# Patient Record
Sex: Female | Born: 1935
Health system: Southern US, Community
[De-identification: ages and names within clinical notes are randomized; demographics above are authoritative.]

## PROBLEM LIST (undated history)

## (undated) DIAGNOSIS — F32A Depression, unspecified: Secondary | ICD-10-CM

## (undated) DIAGNOSIS — I2510 Atherosclerotic heart disease of native coronary artery without angina pectoris: Secondary | ICD-10-CM

## (undated) DIAGNOSIS — K449 Diaphragmatic hernia without obstruction or gangrene: Secondary | ICD-10-CM

## (undated) DIAGNOSIS — H269 Unspecified cataract: Secondary | ICD-10-CM

## (undated) DIAGNOSIS — T7840XA Allergy, unspecified, initial encounter: Secondary | ICD-10-CM

## (undated) DIAGNOSIS — M1990 Unspecified osteoarthritis, unspecified site: Secondary | ICD-10-CM

## (undated) DIAGNOSIS — N6019 Diffuse cystic mastopathy of unspecified breast: Secondary | ICD-10-CM

## (undated) DIAGNOSIS — M810 Age-related osteoporosis without current pathological fracture: Secondary | ICD-10-CM

## (undated) DIAGNOSIS — F329 Major depressive disorder, single episode, unspecified: Secondary | ICD-10-CM

## (undated) DIAGNOSIS — K219 Gastro-esophageal reflux disease without esophagitis: Secondary | ICD-10-CM

## (undated) DIAGNOSIS — J45909 Unspecified asthma, uncomplicated: Secondary | ICD-10-CM

## (undated) DIAGNOSIS — F419 Anxiety disorder, unspecified: Secondary | ICD-10-CM

## (undated) DIAGNOSIS — R011 Cardiac murmur, unspecified: Secondary | ICD-10-CM

## (undated) DIAGNOSIS — K5900 Constipation, unspecified: Secondary | ICD-10-CM

## (undated) HISTORY — DX: Age-related osteoporosis without current pathological fracture: M81.0

## (undated) HISTORY — PX: EYE SURGERY: SHX253

## (undated) HISTORY — DX: Allergy, unspecified, initial encounter: T78.40XA

## (undated) HISTORY — PX: HIP SURGERY: SHX245

## (undated) HISTORY — DX: Gastro-esophageal reflux disease without esophagitis: K21.9

## (undated) HISTORY — DX: Cardiac murmur, unspecified: R01.1

## (undated) HISTORY — PX: ABDOMINAL HYSTERECTOMY: SHX81

## (undated) HISTORY — DX: Anxiety disorder, unspecified: F41.9

## (undated) HISTORY — DX: Unspecified cataract: H26.9

## (undated) HISTORY — DX: Constipation, unspecified: K59.00

## (undated) HISTORY — PX: CHOLECYSTECTOMY: SHX55

---

## 2000-10-25 ENCOUNTER — Emergency Department (HOSPITAL_COMMUNITY): Admission: EM | Admit: 2000-10-25 | Discharge: 2000-10-25 | Payer: Self-pay | Admitting: Emergency Medicine

## 2000-10-29 ENCOUNTER — Encounter: Payer: Self-pay | Admitting: Family Medicine

## 2000-10-29 ENCOUNTER — Ambulatory Visit (HOSPITAL_COMMUNITY): Admission: RE | Admit: 2000-10-29 | Discharge: 2000-10-29 | Payer: Self-pay | Admitting: Family Medicine

## 2001-04-01 ENCOUNTER — Ambulatory Visit (HOSPITAL_COMMUNITY): Admission: RE | Admit: 2001-04-01 | Discharge: 2001-04-01 | Payer: Self-pay | Admitting: Family Medicine

## 2001-04-01 ENCOUNTER — Encounter: Payer: Self-pay | Admitting: Family Medicine

## 2002-01-19 ENCOUNTER — Encounter: Payer: Self-pay | Admitting: Family Medicine

## 2002-01-19 ENCOUNTER — Ambulatory Visit (HOSPITAL_COMMUNITY): Admission: RE | Admit: 2002-01-19 | Discharge: 2002-01-19 | Payer: Self-pay | Admitting: Family Medicine

## 2003-05-01 ENCOUNTER — Encounter: Payer: Self-pay | Admitting: Family Medicine

## 2003-05-01 ENCOUNTER — Ambulatory Visit (HOSPITAL_COMMUNITY): Admission: RE | Admit: 2003-05-01 | Discharge: 2003-05-01 | Payer: Self-pay | Admitting: Family Medicine

## 2003-09-18 ENCOUNTER — Ambulatory Visit (HOSPITAL_COMMUNITY): Admission: RE | Admit: 2003-09-18 | Discharge: 2003-09-18 | Payer: Self-pay | Admitting: Family Medicine

## 2004-10-09 ENCOUNTER — Inpatient Hospital Stay (HOSPITAL_COMMUNITY): Admission: EM | Admit: 2004-10-09 | Discharge: 2004-10-10 | Payer: Self-pay | Admitting: *Deleted

## 2004-10-11 ENCOUNTER — Ambulatory Visit: Payer: Self-pay | Admitting: Psychology

## 2004-11-14 ENCOUNTER — Ambulatory Visit: Payer: Self-pay | Admitting: Psychiatry

## 2005-11-04 ENCOUNTER — Ambulatory Visit (HOSPITAL_COMMUNITY): Admission: RE | Admit: 2005-11-04 | Discharge: 2005-11-04 | Payer: Self-pay | Admitting: Family Medicine

## 2006-12-28 ENCOUNTER — Ambulatory Visit (HOSPITAL_COMMUNITY): Admission: RE | Admit: 2006-12-28 | Discharge: 2006-12-28 | Payer: Self-pay | Admitting: Ophthalmology

## 2007-01-18 ENCOUNTER — Ambulatory Visit (HOSPITAL_COMMUNITY): Admission: RE | Admit: 2007-01-18 | Discharge: 2007-01-18 | Payer: Self-pay | Admitting: Ophthalmology

## 2007-10-05 ENCOUNTER — Ambulatory Visit (HOSPITAL_COMMUNITY): Admission: RE | Admit: 2007-10-05 | Discharge: 2007-10-05 | Payer: Self-pay | Admitting: Family Medicine

## 2007-12-20 ENCOUNTER — Ambulatory Visit (HOSPITAL_COMMUNITY): Admission: RE | Admit: 2007-12-20 | Discharge: 2007-12-20 | Payer: Self-pay | Admitting: Family Medicine

## 2008-02-14 ENCOUNTER — Ambulatory Visit (HOSPITAL_COMMUNITY): Admission: RE | Admit: 2008-02-14 | Discharge: 2008-02-14 | Payer: Self-pay | Admitting: Ophthalmology

## 2008-03-13 ENCOUNTER — Ambulatory Visit (HOSPITAL_COMMUNITY): Admission: RE | Admit: 2008-03-13 | Discharge: 2008-03-13 | Payer: Self-pay | Admitting: Ophthalmology

## 2008-12-19 ENCOUNTER — Ambulatory Visit (HOSPITAL_COMMUNITY): Admission: RE | Admit: 2008-12-19 | Discharge: 2008-12-19 | Payer: Self-pay | Admitting: Family Medicine

## 2009-04-05 ENCOUNTER — Ambulatory Visit (HOSPITAL_COMMUNITY): Admission: RE | Admit: 2009-04-05 | Discharge: 2009-04-05 | Payer: Self-pay | Admitting: Family Medicine

## 2009-04-18 ENCOUNTER — Emergency Department (HOSPITAL_COMMUNITY): Admission: EM | Admit: 2009-04-18 | Discharge: 2009-04-18 | Payer: Self-pay | Admitting: Emergency Medicine

## 2009-05-08 ENCOUNTER — Ambulatory Visit (HOSPITAL_COMMUNITY): Admission: RE | Admit: 2009-05-08 | Discharge: 2009-05-08 | Payer: Self-pay | Admitting: Pulmonary Disease

## 2010-02-20 ENCOUNTER — Ambulatory Visit (HOSPITAL_COMMUNITY): Admission: RE | Admit: 2010-02-20 | Discharge: 2010-02-20 | Payer: Self-pay | Admitting: Family Medicine

## 2010-08-04 ENCOUNTER — Encounter: Payer: Self-pay | Admitting: Family Medicine

## 2010-10-17 LAB — BRAIN NATRIURETIC PEPTIDE: Pro B Natriuretic peptide (BNP): <30 pg/mL (ref 0.0–100.0)

## 2010-10-17 LAB — CBC
HCT: 40.3 % (ref 36.0–46.0)
Hemoglobin: 14.1 g/dL (ref 12.0–15.0)
MCV: 92.6 fL (ref 78.0–100.0)
RBC: 4.36 MIL/uL (ref 3.87–5.11)
WBC: 8.2 10*3/uL (ref 4.0–10.5)

## 2010-10-17 LAB — BASIC METABOLIC PANEL
CO2: 27 mEq/L (ref 19–32)
Chloride: 103 mEq/L (ref 96–112)
Creatinine, Ser: 0.79 mg/dL (ref 0.4–1.2)
GFR calc Af Amer: >60 mL/min (ref >60)
Glucose, Bld: 158 mg/dL — ABNORMAL HIGH (ref 70–99)
Potassium: 3.9 mEq/L (ref 3.5–5.1)
Sodium: 138 mEq/L (ref 135–145)

## 2010-10-17 LAB — DIFFERENTIAL
Basophils Absolute: 0 10*3/uL (ref 0.0–0.1)
Eosinophils Relative: 7 % — ABNORMAL HIGH (ref 0–5)
Monocytes Absolute: 0.4 10*3/uL (ref 0.1–1.0)
Neutro Abs: 6 10*3/uL (ref 1.7–7.7)
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
## 2010-11-29 NOTE — Group Therapy Note (Signed)
Mikayla Wilson, Mikayla Wilson              ACCOUNT NO.:  [id number]   MEDICAL RECORD NO.:  [id number]          PATIENT TYPE:  INP   LOCATION:  A313                          FACILITY:  [facility]   PHYSICIAN:  Angus G. Renard Matter, MD   DATE OF BIRTH:  08-11-1930   DATE OF PROCEDURE:  10/10/2004  DATE OF DISCHARGE:                                   PROGRESS NOTE   This patient was seen by a psychologist yesterday and a family conference is  being planned today. She remains relatively calm.   PHYSICAL EXAMINATION:  Her blood pressure 127/73, respirations 20, pulse 90,  temperature 98.1. Heart:  Regular rhythm. Lungs: Clear to percussion and  auscultation. Abdomen: No palpable organs or masses.   ASSESSMENT:  The patient was admitted after an episode of paranoid behavior.  Her condition is stable.   PLAN:  Continue current regimen.      AGM/MEDQ  D:  10/10/2004  T:  10/10/2004  Job:  [id number]

## 2010-11-29 NOTE — Group Therapy Note (Signed)
NAMEWILLYE, Mikayla Wilson              ACCOUNT NO.:  [id number]   MEDICAL RECORD NO.:  [id number]          PATIENT TYPE:  OBV   LOCATION:  A313                          FACILITY:  [facility]   PHYSICIAN:  Angus G. 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   OBJECTIVE:  VITAL SIGNS:  Blood pressure 129/86, respirations 22, pulse 88,  temperature 98.  She does have low potassium 3.3.  LUNGS:  Clear, but diminished breath sounds.  HEART:  Regular rhythm.  ABDOMEN:  No palpable organs or masses.   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   PLAN:  Continue current regimen.      AGM/MEDQ  D:  10/08/2004  T:  10/08/2004  Job:  [id number]
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
## 2011-05-01 LAB — BASIC METABOLIC PANEL
BUN: 7
Calcium: 9.2
Chloride: 102
Creatinine, Ser: 0.77
GFR calc Af Amer: >60

## 2011-05-01 LAB — HEMOGLOBIN AND HEMATOCRIT, BLOOD
HCT: 37.5
Hemoglobin: 13.3

## 2013-02-25 ENCOUNTER — Other Ambulatory Visit (HOSPITAL_COMMUNITY): Payer: Self-pay | Admitting: Family Medicine

## 2013-02-25 DIAGNOSIS — Z139 Encounter for screening, unspecified: Secondary | ICD-10-CM

## 2013-05-05 ENCOUNTER — Ambulatory Visit (HOSPITAL_COMMUNITY)
Admission: RE | Admit: 2013-05-05 | Discharge: 2013-05-05 | Disposition: A | Discharge status: Home / Self Care | Payer: Medicare Other | Source: Ambulatory Visit | Attending: Family Medicine | Admitting: Family Medicine

## 2013-05-05 DIAGNOSIS — Z1231 Encounter for screening mammogram for malignant neoplasm of breast: Secondary | ICD-10-CM | POA: Insufficient documentation

## 2013-05-05 DIAGNOSIS — Z139 Encounter for screening, unspecified: Secondary | ICD-10-CM

## 2013-10-18 DIAGNOSIS — F410 Panic disorder [episodic paroxysmal anxiety] without agoraphobia: Secondary | ICD-10-CM | POA: Diagnosis not present

## 2013-10-18 DIAGNOSIS — Z79899 Other long term (current) drug therapy: Secondary | ICD-10-CM | POA: Diagnosis not present

## 2013-10-18 DIAGNOSIS — G4700 Insomnia, unspecified: Secondary | ICD-10-CM | POA: Diagnosis not present

## 2013-10-18 DIAGNOSIS — E785 Hyperlipidemia, unspecified: Secondary | ICD-10-CM | POA: Diagnosis not present

## 2013-10-27 DIAGNOSIS — H47329 Drusen of optic disc, unspecified eye: Secondary | ICD-10-CM | POA: Diagnosis not present

## 2013-11-08 DIAGNOSIS — J449 Chronic obstructive pulmonary disease, unspecified: Secondary | ICD-10-CM | POA: Diagnosis not present

## 2013-11-08 DIAGNOSIS — J3089 Other allergic rhinitis: Secondary | ICD-10-CM | POA: Diagnosis not present

## 2014-02-17 DIAGNOSIS — Z79899 Other long term (current) drug therapy: Secondary | ICD-10-CM | POA: Diagnosis not present

## 2014-02-17 DIAGNOSIS — E785 Hyperlipidemia, unspecified: Secondary | ICD-10-CM | POA: Diagnosis not present

## 2014-03-24 DIAGNOSIS — Z23 Encounter for immunization: Secondary | ICD-10-CM | POA: Diagnosis not present

## 2014-04-26 DIAGNOSIS — Z961 Presence of intraocular lens: Secondary | ICD-10-CM | POA: Diagnosis not present

## 2014-04-26 DIAGNOSIS — H47323 Drusen of optic disc, bilateral: Secondary | ICD-10-CM | POA: Diagnosis not present

## 2014-07-17 DIAGNOSIS — Z79899 Other long term (current) drug therapy: Secondary | ICD-10-CM | POA: Diagnosis not present

## 2014-07-17 DIAGNOSIS — E785 Hyperlipidemia, unspecified: Secondary | ICD-10-CM | POA: Diagnosis not present

## 2014-08-24 ENCOUNTER — Other Ambulatory Visit: Payer: Self-pay | Admitting: Obstetrics and Gynecology

## 2014-08-24 ENCOUNTER — Other Ambulatory Visit (HOSPITAL_COMMUNITY): Payer: Self-pay | Admitting: Family Medicine

## 2014-08-24 DIAGNOSIS — Z1231 Encounter for screening mammogram for malignant neoplasm of breast: Secondary | ICD-10-CM

## 2014-11-09 DIAGNOSIS — J4530 Mild persistent asthma, uncomplicated: Secondary | ICD-10-CM | POA: Diagnosis not present

## 2014-11-09 DIAGNOSIS — K219 Gastro-esophageal reflux disease without esophagitis: Secondary | ICD-10-CM | POA: Diagnosis not present

## 2014-11-09 DIAGNOSIS — J3089 Other allergic rhinitis: Secondary | ICD-10-CM | POA: Diagnosis not present

## 2014-11-16 ENCOUNTER — Ambulatory Visit (HOSPITAL_COMMUNITY)
Admission: RE | Admit: 2014-11-16 | Discharge: 2014-11-16 | Disposition: A | Discharge status: Home / Self Care | Payer: Medicare Other | Source: Ambulatory Visit | Attending: Family Medicine | Admitting: Family Medicine

## 2014-11-16 DIAGNOSIS — R531 Weakness: Secondary | ICD-10-CM | POA: Diagnosis not present

## 2014-11-16 DIAGNOSIS — Z1231 Encounter for screening mammogram for malignant neoplasm of breast: Secondary | ICD-10-CM | POA: Insufficient documentation

## 2014-11-16 DIAGNOSIS — E785 Hyperlipidemia, unspecified: Secondary | ICD-10-CM | POA: Diagnosis not present

## 2014-11-17 DIAGNOSIS — R531 Weakness: Secondary | ICD-10-CM | POA: Diagnosis not present

## 2014-11-17 DIAGNOSIS — Z79899 Other long term (current) drug therapy: Secondary | ICD-10-CM | POA: Diagnosis not present

## 2014-11-17 DIAGNOSIS — E785 Hyperlipidemia, unspecified: Secondary | ICD-10-CM | POA: Diagnosis not present

## 2014-11-20 ENCOUNTER — Other Ambulatory Visit: Payer: Self-pay | Admitting: Family Medicine

## 2014-11-20 DIAGNOSIS — R928 Other abnormal and inconclusive findings on diagnostic imaging of breast: Secondary | ICD-10-CM

## 2014-11-21 ENCOUNTER — Other Ambulatory Visit (HOSPITAL_COMMUNITY): Payer: Self-pay | Admitting: Family Medicine

## 2014-11-21 DIAGNOSIS — R928 Other abnormal and inconclusive findings on diagnostic imaging of breast: Secondary | ICD-10-CM

## 2014-12-12 ENCOUNTER — Ambulatory Visit (HOSPITAL_COMMUNITY)
Admission: RE | Admit: 2014-12-12 | Discharge: 2014-12-12 | Disposition: A | Discharge status: Home / Self Care | Payer: Medicare Other | Source: Ambulatory Visit | Attending: Family Medicine | Admitting: Family Medicine

## 2014-12-12 DIAGNOSIS — R928 Other abnormal and inconclusive findings on diagnostic imaging of breast: Secondary | ICD-10-CM | POA: Diagnosis not present

## 2015-01-25 ENCOUNTER — Emergency Department (HOSPITAL_COMMUNITY)
Admission: EM | Admit: 2015-01-25 | Discharge: 2015-01-26 | Disposition: A | Discharge status: Home / Self Care | Payer: Medicare Other | Attending: Emergency Medicine | Admitting: Emergency Medicine

## 2015-01-25 ENCOUNTER — Encounter (HOSPITAL_COMMUNITY): Payer: Self-pay | Admitting: *Deleted

## 2015-01-25 DIAGNOSIS — F329 Major depressive disorder, single episode, unspecified: Secondary | ICD-10-CM | POA: Insufficient documentation

## 2015-01-25 DIAGNOSIS — Z8719 Personal history of other diseases of the digestive system: Secondary | ICD-10-CM | POA: Insufficient documentation

## 2015-01-25 DIAGNOSIS — J45909 Unspecified asthma, uncomplicated: Secondary | ICD-10-CM | POA: Insufficient documentation

## 2015-01-25 DIAGNOSIS — F1310 Sedative, hypnotic or anxiolytic abuse, uncomplicated: Secondary | ICD-10-CM | POA: Diagnosis not present

## 2015-01-25 DIAGNOSIS — G479 Sleep disorder, unspecified: Secondary | ICD-10-CM | POA: Insufficient documentation

## 2015-01-25 DIAGNOSIS — Z8742 Personal history of other diseases of the female genital tract: Secondary | ICD-10-CM | POA: Insufficient documentation

## 2015-01-25 DIAGNOSIS — Z87891 Personal history of nicotine dependence: Secondary | ICD-10-CM | POA: Diagnosis not present

## 2015-01-25 DIAGNOSIS — I2510 Atherosclerotic heart disease of native coronary artery without angina pectoris: Secondary | ICD-10-CM | POA: Insufficient documentation

## 2015-01-25 DIAGNOSIS — F32A Depression, unspecified: Secondary | ICD-10-CM

## 2015-01-25 DIAGNOSIS — Z8739 Personal history of other diseases of the musculoskeletal system and connective tissue: Secondary | ICD-10-CM | POA: Insufficient documentation

## 2015-01-25 DIAGNOSIS — F419 Anxiety disorder, unspecified: Secondary | ICD-10-CM | POA: Diagnosis present

## 2015-01-25 DIAGNOSIS — R45851 Suicidal ideations: Secondary | ICD-10-CM

## 2015-01-25 DIAGNOSIS — Z046 Encounter for general psychiatric examination, requested by authority: Secondary | ICD-10-CM | POA: Diagnosis present

## 2015-01-25 HISTORY — DX: Unspecified osteoarthritis, unspecified site: M19.90

## 2015-01-25 HISTORY — DX: Diaphragmatic hernia without obstruction or gangrene: K44.9

## 2015-01-25 HISTORY — DX: Unspecified asthma, uncomplicated: J45.909

## 2015-01-25 HISTORY — DX: Depression, unspecified: F32.A

## 2015-01-25 HISTORY — DX: Diffuse cystic mastopathy of unspecified breast: N60.19

## 2015-01-25 HISTORY — DX: Atherosclerotic heart disease of native coronary artery without angina pectoris: I25.10

## 2015-01-25 HISTORY — DX: Major depressive disorder, single episode, unspecified: F32.9

## 2015-01-25 LAB — CBC WITH DIFFERENTIAL/PLATELET
Basophils Absolute: 0 10*3/uL (ref 0.0–0.1)
Basophils Relative: 0 % (ref 0–1)
EOS ABS: 0.5 10*3/uL (ref 0.0–0.7)
EOS PCT: 5 % (ref 0–5)
HEMATOCRIT: 40.9 % (ref 36.0–46.0)
Hemoglobin: 13.9 g/dL (ref 12.0–15.0)
LYMPHS ABS: 3.3 10*3/uL (ref 0.7–4.0)
LYMPHS PCT: 33 % (ref 12–46)
MCH: 31.8 pg (ref 26.0–34.0)
MCHC: 34 g/dL (ref 30.0–36.0)
MCV: 93.6 fL (ref 78.0–100.0)
Monocytes Absolute: 0.6 10*3/uL (ref 0.1–1.0)
Monocytes Relative: 6 % (ref 3–12)
NEUTROS ABS: 5.7 10*3/uL (ref 1.7–7.7)
Neutrophils Relative %: 56 % (ref 43–77)
Platelets: 181 10*3/uL (ref 150–400)
RBC: 4.37 MIL/uL (ref 3.87–5.11)
RDW: 12.3 % (ref 11.5–15.5)
WBC: 10.2 10*3/uL (ref 4.0–10.5)

## 2015-01-25 LAB — URINE MICROSCOPIC-ADD ON

## 2015-01-25 LAB — URINALYSIS, ROUTINE W REFLEX MICROSCOPIC
Bilirubin Urine: NEGATIVE
GLUCOSE, UA: NEGATIVE mg/dL
Ketones, ur: NEGATIVE mg/dL
Nitrite: NEGATIVE
PROTEIN: NEGATIVE mg/dL
SPECIFIC GRAVITY, URINE: 1.015 (ref 1.005–1.030)
Urobilinogen, UA: 0.2 mg/dL (ref 0.0–1.0)
pH: 6 (ref 5.0–8.0)

## 2015-01-25 LAB — COMPREHENSIVE METABOLIC PANEL
ALBUMIN: 4.6 g/dL (ref 3.5–5.0)
ALK PHOS: 59 U/L (ref 38–126)
ALT: 27 U/L (ref 14–54)
AST: 35 U/L (ref 15–41)
Anion gap: 9 (ref 5–15)
BILIRUBIN TOTAL: 0.7 mg/dL (ref 0.3–1.2)
BUN: 6 mg/dL (ref 6–20)
CHLORIDE: 105 mmol/L (ref 101–111)
CO2: 28 mmol/L (ref 22–32)
CREATININE: 0.83 mg/dL (ref 0.44–1.00)
Calcium: 9 mg/dL (ref 8.9–10.3)
Glucose, Bld: 110 mg/dL — ABNORMAL HIGH (ref 65–99)
Potassium: 3.3 mmol/L — ABNORMAL LOW (ref 3.5–5.1)
Sodium: 142 mmol/L (ref 135–145)
Total Protein: 7.7 g/dL (ref 6.5–8.1)

## 2015-01-25 LAB — RAPID URINE DRUG SCREEN, HOSP PERFORMED
Amphetamines: NOT DETECTED
BENZODIAZEPINES: POSITIVE — AB
Barbiturates: NOT DETECTED
Cocaine: NOT DETECTED
OPIATES: NOT DETECTED
Tetrahydrocannabinol: NOT DETECTED

## 2015-01-25 LAB — ETHANOL: Alcohol, Ethyl (B): <5 mg/dL (ref <5)
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
## 2015-01-26 ENCOUNTER — Encounter (HOSPITAL_COMMUNITY): Payer: Self-pay | Admitting: *Deleted

## 2015-01-26 DIAGNOSIS — F419 Anxiety disorder, unspecified: Secondary | ICD-10-CM | POA: Diagnosis present

## 2015-01-26 DIAGNOSIS — R45851 Suicidal ideations: Secondary | ICD-10-CM

## 2015-01-26 MED ORDER — ONDANSETRON HCL 4 MG PO TABS: 4.0000 mg | ORAL_TABLET | Freq: Three times a day (TID) | ORAL | Status: DC | PRN

## 2015-01-26 MED ORDER — IBUPROFEN 400 MG PO TABS: 600.0000 mg | ORAL_TABLET | Freq: Three times a day (TID) | ORAL | Status: DC | PRN

## 2015-01-26 MED ORDER — ZOLPIDEM TARTRATE 5 MG PO TABS: 5.0000 mg | ORAL_TABLET | Freq: Every evening | ORAL | Status: DC | PRN

## 2015-01-26 MED ORDER — NICOTINE 7 MG/24HR TD PT24
7.0000 mg | MEDICATED_PATCH | Freq: Every day | TRANSDERMAL | Status: DC
  Filled 2015-01-26 (×2): qty 1

## 2015-01-26 MED ORDER — POTASSIUM CHLORIDE CRYS ER 20 MEQ PO TBCR
40.0000 meq | EXTENDED_RELEASE_TABLET | Freq: Once | ORAL | Status: AC
  Administered 2015-01-26: 40 meq via ORAL
  Filled 2015-01-26: qty 2

## 2015-01-26 MED ORDER — ROSUVASTATIN CALCIUM 10 MG PO TABS
5.0000 mg | ORAL_TABLET | Freq: Every day | ORAL | Status: DC
  Filled 2015-01-26: qty 1

## 2015-01-26 MED ORDER — ACETAMINOPHEN 325 MG PO TABS: 650.0000 mg | ORAL_TABLET | ORAL | Status: DC | PRN

## 2015-01-26 MED ORDER — PANTOPRAZOLE SODIUM 40 MG PO TBEC
40.0000 mg | DELAYED_RELEASE_TABLET | Freq: Every day | ORAL | Status: DC
  Administered 2015-01-26: 40 mg via ORAL
  Filled 2015-01-26: qty 1

## 2015-01-26 MED ORDER — ROSUVASTATIN CALCIUM 5 MG PO TABS
5.0000 mg | ORAL_TABLET | Freq: Every day | ORAL | Status: DC
  Filled 2015-01-26 (×2): qty 1

## 2015-01-26 MED ORDER — ALUM & MAG HYDROXIDE-SIMETH 200-200-20 MG/5ML PO SUSP: 30.0000 mL | ORAL | Status: DC | PRN

## 2015-01-26 NOTE — ED Notes (Addendum)
Mikayla Wilson-daughter-[phone]   Daughter wants to talk to psychiatrist in the morning.

## 2015-01-26 NOTE — ED Notes (Signed)
Patient continues to deny SI ideation. Patient alert, oriented and cooperative.

## 2015-01-26 NOTE — ED Notes (Signed)
Spoke with [employer] Megan regarding contacting pt's daughter about d/c home.

## 2015-01-26 NOTE — BH Assessment (Signed)
Tele Assessment Note   Mikayla Wilson is a 81 y.o. female who presents via IVC petition initiated by her daughter.  Pt denies SI/HI/AVH, stating that she does have SI thoughts at times but "I'm not going to harm myself".  Pt reports stressors that affect her emotional state: (1) spouse died in 12/16/2012; (2) worsening physical health; and (3) issues with children. Pt says her children don't care about her and want her money and home--"they don't care about me".  Pt says--"I would love to go to heaven and I'm waiting for God to take me".  Pt has no past SI attempts.  Pt.'s daughter is POA and says her mother has been mixing cough syrup with juice and mixing lorazepam and cough together, stating that she is taking too much.  Pt.'s doctor has d/c'd the lorazepam rx today because of reported misuse.  Daughter told this Clinical research associate that pt makes statements daily that she wants to die but has no plan and also made statements today of wanting to harm her and her boyfriend. She has no plan but told daughter who is diabetic, that she hopes her blood sugar elevates and she into a coma. Per Hulan Fess, NP, AM psych eval for final disposition.     Axis I: Depressive Disorder NOS Axis II: Deferred Axis III:  Past Medical History  Diagnosis Date  . Arthritis   . Coronary artery disease   . Hiatal hernia   . Fibrocystic breast disease   . Asthma   . Depression    Axis IV: other psychosocial or environmental problems, problems related to social environment and problems with primary support group Axis V: 41-50 serious symptoms  Past Medical History:  Past Medical History  Diagnosis Date  . Arthritis   . Coronary artery disease   . Hiatal hernia   . Fibrocystic breast disease   . Asthma   . Depression     Past Surgical History  Procedure Laterality Date  . Cholecystectomy    . Abdominal hysterectomy      Family History: History reviewed. No pertinent family history.  Social History:  reports that she  has quit smoking. Her smokeless tobacco use includes Snuff. She reports that she does not drink alcohol or use illicit drugs.  Additional Social History:  Alcohol / Drug Use Pain Medications: See MAR  Prescriptions: See MAR  Over the Counter: See MAR  History of alcohol / drug use?: No history of alcohol / drug abuse Longest period of sobriety (when/how long): None   CIWA: CIWA-Ar BP: 154/80 mmHg Pulse Rate: 110 COWS:    PATIENT STRENGTHS: (choose at least two) Supportive family/friends  Allergies: No Known Allergies  Home Medications:  (Not in a hospital admission)  OB/GYN Status:  No LMP recorded. Patient is postmenopausal.  General Assessment Data Location of Assessment: [facility] TTS Assessment: In system Is this a Tele or Face-to-Face Assessment?: Tele Assessment Is this an Initial Assessment or a Re-assessment for this encounter?: Initial Assessment Marital status: Widowed [place] name: None  Is patient pregnant?: No Pregnancy Status: No Living Arrangements: Children (Lives with children ) Can pt return to current living arrangement?: Yes Admission Status: Involuntary Is patient capable of signing voluntary admission?: No Referral Source: MD Insurance type: MCR/MCD  Medical Screening Exam [facility] Walk-in ONLY) Medical Exam completed: No Reason for MSE not completed: Other: (None )  Crisis Care Plan Living Arrangements: Children (Lives with children ) Name of Psychiatrist: None  Name of Therapist: None  Education Status Is patient currently in school?: No Current Grade: None  Highest grade of school patient has completed: None  Name of school: None  Contact person: None   Risk to self with the past 6 months Suicidal Ideation: No Has patient been a risk to self within the past 6 months prior to admission? : No Suicidal Intent: No Has patient had any suicidal intent within the past 6 months prior to admission? : No Is patient at risk for suicide?: No Suicidal  Plan?: No Has patient had any suicidal plan within the past 6 months prior to admission? : No Access to Means: Yes Specify Access to Suicidal Means: Pills  What has been your use of drugs/alcohol within the last 12 months?: None  Previous Attempts/Gestures: No How many times?: 0 Other Self Harm Risks: None  Triggers for Past Attempts: None known Intentional Self Injurious Behavior: None Family Suicide History: No Recent stressful life event(s): Loss (Comment), Conflict (Comment), Recent negative physical changes (Spouse died in 12/09/12, Issues w/family; declining physial heal) Persecutory voices/beliefs?: Yes Depression: Yes Depression Symptoms: Loss of interest in usual pleasures, Feeling angry/irritable Substance abuse history and/or treatment for substance abuse?: No Suicide prevention information given to non-admitted patients: Not applicable  Risk to Others within the past 6 months Homicidal Ideation: No Does patient have any lifetime risk of violence toward others beyond the six months prior to admission? : No Thoughts of Harm to Others: No Current Homicidal Intent: No Current Homicidal Plan: No Access to Homicidal Means: No Identified Victim: None  History of harm to others?: No Assessment of Violence: In past 6-12 months (Threatening verbal behavior ) Violent Behavior Description: None  Does patient have access to weapons?: No Criminal Charges Pending?: No Does patient have a court date: No Is patient on probation?: No  Psychosis Hallucinations: None noted Delusions: None noted  Mental Status Report Appearance/Hygiene: Other (Comment) (Appropriate ) Eye Contact: Good Motor Activity: Unremarkable Speech: Logical/coherent Level of Consciousness: Alert, Irritable Mood: Irritable Affect: Irritable Anxiety Level: None Thought Processes: Coherent, Relevant Judgement: Unimpaired Orientation: Person, Place, Time, Situation Obsessive Compulsive Thoughts/Behaviors:  None  Cognitive Functioning Concentration: Normal Memory: Recent Intact, Remote Intact IQ: Average Insight: Fair Impulse Control: Fair Appetite: Fair Weight Loss: 0 Weight Gain: 0 Sleep: No Change Total Hours of Sleep: 5 Vegetative Symptoms: None  ADLScreening [facility] Assessment Services) Patient's cognitive ability adequate to safely complete daily activities?: Yes Patient able to express need for assistance with ADLs?: Yes Independently performs ADLs?: Yes (appropriate for developmental age)  Prior Inpatient Therapy Prior Inpatient Therapy: No Prior Therapy Facilty/Provider(s): None  Reason for Treatment: None   Prior Outpatient Therapy Prior Outpatient Therapy: No Prior Therapy Dates: None  Prior Therapy Facilty/Provider(s): None  Reason for Treatment: None  Does patient have an ACCT team?: No Does patient have Intensive In-House Services?  : No Does patient have [facility] services? : No Does patient have P4CC services?: No  ADL Screening (condition at time of admission) Patient's cognitive ability adequate to safely complete daily activities?: Yes Is the patient deaf or have difficulty hearing?: No Does the patient have difficulty seeing, even when wearing glasses/contacts?: No Does the patient have difficulty concentrating, remembering, or making decisions?: No Patient able to express need for assistance with ADLs?: Yes Does the patient have difficulty dressing or bathing?: No Independently performs ADLs?: Yes (appropriate for developmental age) Does the patient have difficulty walking or climbing stairs?: No Weakness of Legs: None Weakness of Arms/Hands: None  Home Assistive  Devices/Equipment Home Assistive Devices/Equipment: None  Therapy Consults (therapy consults require a physician order) PT Evaluation Needed: No OT Evalulation Needed: No SLP Evaluation Needed: No Abuse/Neglect Assessment (Assessment to be complete while patient is alone) Physical Abuse:  Denies Verbal Abuse: Denies Sexual Abuse: Denies Exploitation of patient/patient's resources: Denies Self-Neglect: Denies Values / Beliefs Cultural Requests During Hospitalization: None Spiritual Requests During Hospitalization: None Consults Spiritual Care Consult Needed: No Social Work Consult Needed: No Merchant navy officerAdvance Directives (For Healthcare) Does patient have an advance directive?: No Would patient like information on creating an advanced directive?: No - patient declined information Nutrition Screen- [facility] Adult/[facility] Patient's home diet: Regular Has the patient recently lost weight without trying?: No Has the patient been eating poorly because of a decreased appetite?: No Malnutrition Screening Tool Score: 0  Additional Information 1:1 In Past 12 Months?: No CIRT Risk: No Elopement Risk: No Does patient have medical clearance?: Yes     Disposition:  Disposition Initial Assessment Completed for this Encounter: Yes Disposition of Patient: Referred to (Per Hulan FessIjeoma Nwaeze, NP AM psych eval for final disposition ) Patient referred to: Other (Comment) (Per Hulan FessIjeoma Nwaeze, NP< AM psych eval for final disposition )  Murrell ReddenSimmons, Bralin Garry C 01/26/2015 1:33 AM

## 2015-01-26 NOTE — ED Notes (Signed)
Re-eval by psychiatrist in progress.

## 2015-01-26 NOTE — ED Notes (Signed)
Spoke with Velna HatchetSheila May, NP, verbal orders given to discharge pt home with pts own medications.

## 2015-01-26 NOTE — ED Notes (Signed)
Pt sitting in recliner in her room with sitter at bedside.

## 2015-01-26 NOTE — Consult Note (Signed)
Telepsych Consultation   Reason for Consult:  Stating SI/HI Referring Physician:  [place] [facility] Dept Patient Identification: Mikayla Wilson MRN:  [id number] Principal Diagnosis: Anxiety Diagnosis:   Patient Active Problem List   Diagnosis Date Noted  . Suicidal thoughts [R45.851] 01/26/2015  . Anxiety [F41.9] 01/26/2015    Total Time spent with patient: 30 minutes  Subjective:   Mikayla Wilson is a 81 y.o. female patient presented to [facility].  She was IVC'd by her daughter who states that she verbalized suicidal and homicidal ideation.  Patient was seen via telepsych this morning.  She describes a tenuous relationship with her daughter.  The daughter lives with her along with her boyfriend.  Per Mikayla Wilson, her daughter has "lifetime power of atty and likes to control her every movement.    HPI:  Mikayla Wilson is an 81 yo female, brought in by her daughter via IVC petition.  In the tele asst note, it was noted that patient stated, "I would love to go to heaven, go in my sleep."  To this, Mikayla Wilson counters that she has faith and when her time comes, she desires to go in her sleep peacefully.  "I don't want to die now."  She denies SI/HI/AVH.  She does report that her relationship with her daughter aggravates her and makes her more anxious.  She denies taking too much Lorazepam and cough syrup.  She states that she has seasonal allergies and why she takes cough syrup.  She states being followed by her PCP who  prescribes the Lorazepam.  "I can take up to 4 times a day.  I take it 2 times usually.  One in the morning and one at night to help me sleep."  Furthermore, she states that Dr Thurnell Garbe tells me how much I can take.  I don't go over that.  My daughter is lying.  They live in my basement.  I have to pay all the bills."  Patient is alert, oriented.  She is coherent.    When collateral information was obtained from the daughter and the BF, they are contradicting what the patient.  Per Mikayla Wilson in ED, patient has  not displayed any disruptive behavior.   Patient is alert and just wants to go home and check on her house.  Per Mikayla Bos RN, the sheriff will take the patient to her house as she is refusing to be picked up by daughter.   HPI Elements:   Location:  Verbalized thoughts of dying. Quality:  anxious, helpless. Duration:  ongoing. Context:  see HPI.  Past Medical History:  Past Medical History  Diagnosis Date  . Arthritis   . Coronary artery disease   . Hiatal hernia   . Fibrocystic breast disease   . Asthma   . Depression     Past Surgical History  Procedure Laterality Date  . Cholecystectomy    . Abdominal hysterectomy     Family History: History reviewed. No pertinent family history. Social History:  History  Alcohol Use No     History  Drug Use No    History   Social History  . Marital Status: Married    Spouse Name: N/A  . Number of Children: N/A  . Years of Education: N/A   Social History Main Topics  . Smoking status: Former Research scientist (life sciences)  . Smokeless tobacco: Current User    Types: Snuff  . Alcohol Use: No  . Drug Use: No  . Sexual Activity: Not on file  Other Topics Concern  . None   Social History Narrative   Additional Social History:    Pain Medications: See MAR  Prescriptions: See MAR  Over the Counter: See MAR  History of alcohol / drug use?: No history of alcohol / drug abuse Longest period of sobriety (when/how long): None     Allergies:   Allergies  Allergen Reactions  . Aspirin Other (See Comments)    "My doctor told me to never take aspirin"    Labs:  Results for orders placed or performed during the hospital encounter of 01/25/15 (from the past 48 hour(s))  CBC with Differential     Status: None   Collection Time: 01/25/15 11:30 PM  Result Value Ref Range   WBC 10.2 4.0 - 10.5 K/uL   RBC 4.37 3.87 - 5.11 MIL/uL   Hemoglobin 13.9 12.0 - 15.0 g/dL   HCT 40.9 36.0 - 46.0 %   MCV 93.6 78.0 - 100.0 fL   MCH 31.8 26.0 - 34.0 pg   MCHC  34.0 30.0 - 36.0 g/dL   RDW 12.3 11.5 - 15.5 %   Platelets 181 150 - 400 K/uL   Neutrophils Relative % 56 43 - 77 %   Neutro Abs 5.7 1.7 - 7.7 K/uL   Lymphocytes Relative 33 12 - 46 %   Lymphs Abs 3.3 0.7 - 4.0 K/uL   Monocytes Relative 6 3 - 12 %   Monocytes Absolute 0.6 0.1 - 1.0 K/uL   Eosinophils Relative 5 0 - 5 %   Eosinophils Absolute 0.5 0.0 - 0.7 K/uL   Basophils Relative 0 0 - 1 %   Basophils Absolute 0.0 0.0 - 0.1 K/uL  Comprehensive metabolic panel     Status: Abnormal   Collection Time: 01/25/15 11:30 PM  Result Value Ref Range   Sodium 142 135 - 145 mmol/L   Potassium 3.3 (L) 3.5 - 5.1 mmol/L   Chloride 105 101 - 111 mmol/L   CO2 28 22 - 32 mmol/L   Glucose, Bld 110 (H) 65 - 99 mg/dL   BUN 6 6 - 20 mg/dL   Creatinine, Ser 0.83 0.44 - 1.00 mg/dL   Calcium 9.0 8.9 - 10.3 mg/dL   Total Protein 7.7 6.5 - 8.1 g/dL   Albumin 4.6 3.5 - 5.0 g/dL   AST 35 15 - 41 U/L   ALT 27 14 - 54 U/L   Alkaline Phosphatase 59 38 - 126 U/L   Total Bilirubin 0.7 0.3 - 1.2 mg/dL   GFR calc non Af Amer >60 >60 mL/min   GFR calc Af Amer >60 >60 mL/min    Comment: (NOTE) The eGFR has been calculated using the CKD EPI equation. This calculation has not been validated in all clinical situations. eGFR's persistently <60 mL/min signify possible Chronic Kidney Disease.    Anion gap 9 5 - 15  Ethanol     Status: None   Collection Time: 01/25/15 11:30 PM  Result Value Ref Range   Alcohol, Ethyl (B) <5 <5 mg/dL    Comment:        LOWEST DETECTABLE LIMIT FOR SERUM ALCOHOL IS 5 mg/dL FOR MEDICAL PURPOSES ONLY   Urinalysis, Routine w reflex microscopic (not at [facility])     Status: Abnormal   Collection Time: 01/25/15 11:30 PM  Result Value Ref Range   Color, Urine YELLOW YELLOW   APPearance CLEAR CLEAR   Specific Gravity, Urine 1.015 1.005 - 1.030   pH 6.0 5.0 - 8.0  Glucose, UA NEGATIVE NEGATIVE mg/dL   Hgb urine dipstick SMALL (A) NEGATIVE   Bilirubin Urine NEGATIVE NEGATIVE    Ketones, ur NEGATIVE NEGATIVE mg/dL   Protein, ur NEGATIVE NEGATIVE mg/dL   Urobilinogen, UA 0.2 0.0 - 1.0 mg/dL   Nitrite NEGATIVE NEGATIVE   Leukocytes, UA TRACE (A) NEGATIVE  Urine rapid drug screen (hosp performed)     Status: Abnormal   Collection Time: 01/25/15 11:30 PM  Result Value Ref Range   Opiates NONE DETECTED NONE DETECTED   Cocaine NONE DETECTED NONE DETECTED   Benzodiazepines POSITIVE (A) NONE DETECTED   Amphetamines NONE DETECTED NONE DETECTED   Tetrahydrocannabinol NONE DETECTED NONE DETECTED   Barbiturates NONE DETECTED NONE DETECTED    Comment:        DRUG SCREEN FOR MEDICAL PURPOSES ONLY.  IF CONFIRMATION IS NEEDED FOR ANY PURPOSE, NOTIFY LAB WITHIN 5 DAYS.        LOWEST DETECTABLE LIMITS FOR URINE DRUG SCREEN Drug Class       Cutoff (ng/mL) Amphetamine      1000 Barbiturate      200 Benzodiazepine   269 Tricyclics       485 Opiates          300 Cocaine          300 THC              50   Urine microscopic-add on     Status: None   Collection Time: 01/25/15 11:30 PM  Result Value Ref Range   Squamous Epithelial / LPF RARE RARE   WBC, UA 0-2 <3 WBC/hpf   RBC / HPF 0-2 <3 RBC/hpf   Bacteria, UA RARE RARE    Vitals: Blood pressure 154/80, pulse 110, temperature 98.3 F (36.8 C), temperature source Oral, resp. rate 18, height _0  (1.626 m), weight 68.04 kg (150 lb), SpO2 96 %.  Risk to Self: Suicidal Ideation: No Suicidal Intent: No Is patient at risk for suicide?: No Suicidal Plan?: No Access to Means: Yes Specify Access to Suicidal Means: Pills  What has been your use of drugs/alcohol within the last 12 months?: None  How many times?: 0 Other Self Harm Risks: None  Triggers for Past Attempts: None known Intentional Self Injurious Behavior: None Risk to Others: Homicidal Ideation: No Thoughts of Harm to Others: No Current Homicidal Intent: No Current Homicidal Plan: No Access to Homicidal Means: No Identified Victim: None  History of harm  to others?: No Assessment of Violence: In past 6-12 months (Threatening verbal behavior ) Violent Behavior Description: None  Does patient have access to weapons?: No Criminal Charges Pending?: No Does patient have a court date: No Prior Inpatient Therapy: Prior Inpatient Therapy: No Prior Therapy Facilty/Provider(s): None  Reason for Treatment: None  Prior Outpatient Therapy: Prior Outpatient Therapy: No Prior Therapy Dates: None  Prior Therapy Facilty/Provider(s): None  Reason for Treatment: None  Does patient have an ACCT team?: No Does patient have Intensive In-House Services?  : No Does patient have [facility] services? : No Does patient have P4CC services?: No  Current Facility-Administered Medications  Medication Dose Route Frequency Provider Last Rate Last Dose  . acetaminophen (TYLENOL) tablet 650 mg  650 mg Oral I6E PRN Delora Fuel, MD      . alum & mag hydroxide-simeth (MAALOX/MYLANTA) 200-200-20 MG/5ML suspension 30 mL  30 mL Oral PRN Delora Fuel, MD      . ibuprofen (ADVIL,MOTRIN) tablet 600 mg  600 mg Oral Q8H PRN Shanon Brow  Roxanne Mins, MD      . nicotine (NICODERM CQ - dosed in mg/24 hr) patch 7 mg  7 mg Transdermal Daily Delora Fuel, MD   7 mg at 89/21/19 1016  . ondansetron (ZOFRAN) tablet 4 mg  4 mg Oral E1D PRN Delora Fuel, MD      . pantoprazole (PROTONIX) EC tablet 40 mg  40 mg Oral Daily Delora Fuel, MD   40 mg at 40/81/44 1015  . rosuvastatin (CRESTOR) tablet 5 mg  5 mg Oral q1800 Provider Not In System      . zolpidem (AMBIEN) tablet 5 mg  5 mg Oral QHS PRN Delora Fuel, MD       Current Outpatient Prescriptions  Medication Sig Dispense Refill  . acetaminophen (TYLENOL) 500 MG tablet Take 1,000 mg by mouth daily as needed for mild pain or moderate pain.    . Cholecalciferol (VITAMIN D PO) Take 1 tablet by mouth daily.    . GuaiFENesin (COUGH SYRUP PO) Take 15 mLs by mouth daily as needed (cough).    Marland Kitchen LORATADINE PO Take 1 tablet by mouth daily as needed (allergies, runny  nose).    . LORazepam (ATIVAN) 1 MG tablet Take 1 mg by mouth every 4 (four) hours as needed for anxiety.    . Naphazoline-Pheniramine (VISINE-A OP) Apply 1-2 drops to eye 2 (two) times daily as needed (allergies).    . Nutritional Supplements (ENSURE NUTRITION SHAKE PO) Take 1-2 Bottles by mouth daily.    . Nutritional Supplements (EQUATE) LIQD Take 15-30 mLs by mouth daily as needed (constipation).    Marland Kitchen omeprazole (PRILOSEC) 20 MG capsule Take 20 mg by mouth daily.    Marland Kitchen OVER THE COUNTER MEDICATION Place 2 drops into both ears daily as needed (wax buildup).    . rosuvastatin (CRESTOR) 5 MG tablet Take 5 mg by mouth daily.      Musculoskeletal: Strength & Muscle Tone: within normal limits Gait & Station: normal Patient leans: N/A  Psychiatric Specialty Exam: Physical Exam  Vitals reviewed.   ROS  Blood pressure 154/80, pulse 110, temperature 98.3 F (36.8 C), temperature source Oral, resp. rate 18, height _0  (1.626 m), weight 68.04 kg (150 lb), SpO2 96 %.Body mass index is 25.73 kg/(m^2).  General Appearance: Neat  Eye Contact::  Good  Speech:  Clear and Coherent  Volume:  Normal  Mood:  Anxious  Affect:  Appropriate  Thought Process:  Coherent  Orientation:  Full (Time, Place, and Person)  Thought Content:  Rumination  Suicidal Thoughts:  No  Homicidal Thoughts:  No  Memory:  Immediate;   Good Recent;   Good Remote;   Good  Judgement:  Good  Insight:  Good  Psychomotor Activity:  Normal  Concentration:  Good  Recall:  Good  Fund of Knowledge:Good  Language: Good  Akathisia:  Negative  Handed:  Right  AIMS (if indicated):     Assets:  Communication Skills Desire for Improvement Financial Resources/Insurance Housing Leisure Time Physical Health Resilience  ADL's:  Intact  Cognition: WNL  Sleep:      Medical Decision Making: New problem, with additional work up planned, Review of Psycho-Social Stressors (1), Review or order clinical lab tests (1), Discuss  test with performing physician (1), Decision to obtain old records (1), Review and summation of old records (2), New Problem, with no additional work-up planned (3), Review of Last Therapy Session (1), Review or order medicine tests (1), Independent Review of image, tracing or specimen (2)  and Review of Medication Regimen & Side Effects (2)  Treatment Plan Summary: Plan discharge home.  Patient is being followed by PCP who prescribes her Lorazepam PRN.  Patient takes this for anxiety and insomnia.      Plan:  No evidence of imminent risk to self or others at present.   Patient does not meet criteria for psychiatric inpatient admission. Supportive therapy provided about ongoing stressors.  Advised of PCP follow up.  Patient may need an alternative med regimen to help control anxiety or alleged angry outbursts (per daughter) CSW to make referrals for out patient therapy to deal with emotional issues with anxiety and stressful home environment.     Disposition:  Discharge to home.  See above plan  Freda Munro May Agustin AGNP-BC 01/26/2015 11:07 AM  I have been consulted about this patient and agree with the assessment and plan Geralyn Flash A. [place].D.

## 2015-01-26 NOTE — ED Provider Notes (Signed)
Seen by psych Cleared for d/c IVC rescinded Pt denies SI/HI She appears appropriate Stable for d/c home   Zadie Rhineonald Jared Whorley, MD 01/26/15 1339

## 2015-01-26 NOTE — ED Provider Notes (Signed)
Daughter concerned about discharge Discharge cancelled Re-consulted psych to speak to family   Zadie Rhineonald Taegan Standage, MD 01/26/15 1358

## 2015-01-26 NOTE — Discharge Instructions (Signed)
°Depression °Depression refers to feeling sad, low, down in the dumps, blue, gloomy, or empty. In general, there are two kinds of depression: °1. Normal sadness or normal grief. This kind of depression is one that we all feel from time to time after upsetting life experiences, such as the loss of a job or the ending of a relationship. This kind of depression is considered normal, is short lived, and resolves within a few days to 2 weeks. Depression experienced after the loss of a loved one (bereavement) often lasts longer than 2 weeks but normally gets better with time. °2. Clinical depression. This kind of depression lasts longer than normal sadness or normal grief or interferes with your ability to function at home, at work, and in school. It also interferes with your personal relationships. It affects almost every aspect of your life. Clinical depression is an illness. °Symptoms of depression can also be caused by conditions other than those mentioned above, such as: °· Physical illness. Some physical illnesses, including underactive thyroid gland (hypothyroidism), severe anemia, specific types of cancer, diabetes, uncontrolled seizures, heart and lung problems, strokes, and chronic pain are commonly associated with symptoms of depression. °· Side effects of some prescription medicine. In some people, certain types of medicine can cause symptoms of depression. °· Substance abuse. Abuse of alcohol and illicit drugs can cause symptoms of depression. °SYMPTOMS °Symptoms of normal sadness and normal grief include the following: °· Feeling sad or crying for short periods of time. °· Not caring about anything (apathy). °· Difficulty sleeping or sleeping too much. °· No longer able to enjoy the things you used to enjoy. °· Desire to be by oneself all the time (social isolation). °· Lack of energy or motivation. °· Difficulty concentrating or remembering. °· Change in appetite or weight. °· Restlessness or  agitation. °Symptoms of clinical depression include the same symptoms of normal sadness or normal grief and also the following symptoms: °· Feeling sad or crying all the time. °· Feelings of guilt or worthlessness. °· Feelings of hopelessness or helplessness. °· Thoughts of suicide or the desire to harm yourself (suicidal ideation). °· Loss of touch with reality (psychotic symptoms). Seeing or hearing things that are not real (hallucinations) or having false beliefs about your life or the people around you (delusions and paranoia). °DIAGNOSIS  °The diagnosis of clinical depression is usually based on how bad the symptoms are and how long they have lasted. Your health care provider will also ask you questions about your medical history and substance use to find out if physical illness, use of prescription medicine, or substance abuse is causing your depression. Your health care provider may also order blood tests. °TREATMENT  °Often, normal sadness and normal grief do not require treatment. However, sometimes antidepressant medicine is given for bereavement to ease the depressive symptoms until they resolve. °The treatment for clinical depression depends on how bad the symptoms are but often includes antidepressant medicine, counseling with a mental health professional, or both. Your health care provider will help to determine what treatment is best for you. °Depression caused by physical illness usually goes away with appropriate medical treatment of the illness. If prescription medicine is causing depression, talk with your health care provider about stopping the medicine, decreasing the dose, or changing to another medicine. °Depression caused by the abuse of alcohol or illicit drugs goes away when you stop using these substances. Some adults need professional help in order to stop drinking or using drugs. °SEEK IMMEDIATE MEDICAL   CARE IF: °· You have thoughts about hurting yourself or others. °· You lose touch  with reality (have psychotic symptoms). °· You are taking medicine for depression and have a serious side effect. °FOR MORE INFORMATION °· National Alliance on Mental Illness: www.nami.org  °· National Institute of Mental Health: www.nimh.nih.gov  °Document Released: 06/27/2000 Document Revised: 11/14/2013 Document Reviewed: 09/29/2011 °ExitCare® Patient Information ©2015 [employer]. This information is not intended to replace advice given to you by your health care provider. Make sure you discuss any questions you have with your health care provider. °  Emergency Department Resource Guide °1) Find a Doctor and Pay Out of Pocket °Although you won't have to find out who is covered by your insurance plan, it is a good idea to ask around and get recommendations. You will then need to call the office and see if the doctor you have chosen will accept you as a new patient and what types of options they offer for patients who are self-pay. Some doctors offer discounts or will set up payment plans for their patients who do not have insurance, but you will need to ask so you aren't surprised when you get to your appointment. ° °2) Contact Your Local Health Department °Not all health departments have doctors that can see patients for sick visits, but many do, so it is worth a call to see if yours does. If you don't know where your local health department is, you can check in your phone book. The CDC also has a tool to help you locate your state's health department, and many state websites also have listings of all of their local health departments. ° °3) Find a Walk-in Clinic °If your illness is not likely to be very severe or complicated, you may want to try a walk in clinic. These are popping up all over the country in pharmacies, drugstores, and shopping centers. They're usually staffed by nurse practitioners or physician assistants that have been trained to treat common illnesses and complaints. They're usually fairly  quick and inexpensive. However, if you have serious medical issues or chronic medical problems, these are probably not your best option. ° °No Primary Care Doctor: °- Call [facility] at  832-8000 - they can help you locate a primary care doctor that  accepts your insurance, provides certain services, etc. °- Physician Referral Service- 1-800-533-3463 ° °Chronic Pain Problems: °Organization         Address  Phone   Notes  °[facility]  (336) 297-2271 Patients need to be referred by their primary care doctor.  ° °Medication Assistance: °Organization         Address  Phone   Notes  °[place] [facility] [address] °[place], [place] [address] (336) 641-8030 --Must be a resident of [place] °-- Must have NO insurance coverage whatsoever (no Medicaid/ Medicare, etc.) °-- The pt. MUST have a primary care doctor that directs their care regularly and follows them in the community °  °[facility]  (866) 331-1348   °[employer]  (888) 892-1162   ° °Agencies that provide inexpensive medical care: °Organization         Address  Phone   Notes  °[facility]  (336) 832-8035   °[facility]    (336) 832-7272   °[facility] [address] °[place], [place] [address] (336) 832-4777   °[facility] [address], °[place] (336) 271-4999   °  [facility]    (336) 373-0678   °[facility]    (336) 272-1050   °[facility] ° [address], [place] Phone:  (336) 832-4444, Fax:  (336) 832-4440 Hours of Operation:  9 am - 6 pm, M-F.  Also accepts Medicaid/Medicare and self-pay.  °[facility] ° [address], [place] Phone: (336) 832-3150, Fax: (336) 832-3151. Hours of Operation:  8:30 am - 5:30 pm, M-F.  Also accepts Medicaid and self-pay.  °[facility] [place] [address], [place] Phone: (336) 878-6027    °[facility] [address], [place], [place] (336)723-1848, Ext. 123 Mondays & Thursdays: 7-9 AM.  First 15 patients are seen on a first come, first serve basis. °  ° °Medicaid-accepting [place] Providers: ° °Organization         Address  Phone   Notes  °[facility] [address], [place] (336) 641-2100 Also accepts self-pay patients.  °[facility] [address], [place] ° (336) 856-9996   °[facility] [address], [place] (336) 288-8857   °[facility] [address] [place] [address], [place] (336) 299-7000   °Veita Bland [address], [place]  ° (336) 373-1557 Only accepts [place] Access Medicaid patients after they have their name applied to their card.  ° °Self-Pay (no insurance) in [place]: ° °Organization         Address  Phone   Notes  °Sickle Cell Patients, [facility] [address], [place] (336) 832-1970   °[facility] [address], [place] (336) 832-4400   °[facility] ° [address], [place] (336) 992-4800   °[facility]/Dr. Osei-Bonsu ° [address] [place] [address], [place] or [address], [place] (336) 841-8500 Phone number for both [place] and [place] locations is the same.  °[facility] [address], [place] (336) 299-0000   °[facility] [place] [facility] [address] [place] [address], [place] or [address] (336) 852-7530 °(336) 878-2260   °[facility] [address], [place] (336) 350-1642, phone; (336) 294-5005, fax Sees patients 1st and 3rd Saturday of every month.  Must not qualify for public or private insurance (i.e. Medicaid, Medicare, [place] Health Choice, Veterans' Benefits) • Household income should be no more than 200% of the poverty level •The clinic cannot treat you if you are  pregnant or think you are pregnant • Sexually transmitted diseases are not treated at the clinic.  ° °Dental Care: °Organization         Address  Phone  Notes  °[facility] [facility] [address], [place] (336) 641-6152 Accepts children up to age 21 who are enrolled in Medicaid or [place] Health Choice; pregnant women with a Medicaid card; and children who have applied for Medicaid or [place] Health Choice, but were declined, whose parents can pay a reduced fee at time of service.  °[facility] [place]  [address], [place] (336) 641-7733 Accepts children up to age 21 who are enrolled in Medicaid or [place] Health Choice; pregnant women with a Medicaid card; and children who have applied for Medicaid or [place] Health Choice, but were declined, whose   parents can pay a reduced fee at time of service.  °[facility] ° [address], [place] (336) 641-4533 Patients are seen by appointment only. Walk-ins are not accepted. [facility] will see patients 18 years of age and older. °Monday - Tuesday (8am-5pm) °Most Wednesdays (8:30-5pm) °$30 per visit, cash only  °[facility] ° [address], [place] (336) 641-4533 Patients are seen by appointment only. Walk-ins are not accepted. [facility] will see patients 18 years of age and older. °One Wednesday Evening (Monthly: Volunteer Based).  $30 per visit, cash only  °[facility]  (919) 537-3737 for adults; Children under age 4, call Graduate Pediatric Dentistry at ([phone]) [phone]. Children aged 4-14, please call (919) [phone] to request a pediatric application. ° Dental services are provided in all areas of dental care including fillings, crowns and bridges, complete and partial dentures, implants, gum treatment, root canals, and extractions. Preventive care is also provided. Treatment is provided to  both adults and children. °Patients are selected via a lottery and there is often a waiting list. °  °[facility] [address], °[place] ° (336) 763-8833 www.drcivils.com °  °[facility] [address], [place], [place] (336)723-1848, Ext. 123 Second and Fourth Thursday of each month, opens at 6:30 AM; Clinic ends at 9 AM.  Patients are seen on a first-come first-served basis, and a limited number are seen during each clinic.  ° °[facility] ° [address], [place], [place] (336) 723-7904   Eligibility Requirements °You must have lived in [place], [place], or [place] counties for at least the last three months. °  You cannot be eligible for state or federal sponsored healthcare insurance, including Veterans Administration, Medicaid, or Medicare. °  You generally cannot be eligible for healthcare insurance through your employer.  °  How to apply: °Eligibility screenings are held every Tuesday and Wednesday afternoon from 1:00 pm until 4:00 pm. You do not need an appointment for the interview!  °[facility] [address], [place], [place] 336-631-2330   °[facility] [facility]  336-342-8273   °[facility]  336-703-3100   °[facility]  336-570-6415   ° °Behavioral Health Resources in the Community: °Intensive Outpatient Programs °Organization         Address  Phone  Notes  °[facility] [address], [place], [place] 336-878-6098   °[facility] [address], [place], [place] 336-832-9800   °[facility] [address], [place], [place] ° 336-882-2125   °[facility] [address],  °[place], [place] 1-800-853-5163 or 336-641-4981   °Substance Abuse Resources °Organization         Address  Phone  Notes  °[facility]  336-882-2125   °[facility]  336-784-9470   °[facility]   336-285-9073   °[facility]  336-845-3988   °Residential & Outpatient Substance Abuse Program  1-800-659-3381   °Psychological Services °Organization         Address  Phone  Notes  °[facility]  336- 832-9600   °[facility]  336- 378-7881   °[facility] [address], [place] 1-800-853-5163 or 336-641-4981   ° °Mobile Crisis Teams °Organization         Address  Phone  Notes  °[facility], Mobile Crisis   Care Unit  1-877-626-1772  °Assertive °[facility] ° [address]  [place], [place] 336-834-9664  °Sharon DeEsch [address] °[place] 336-554-5454  ° °Self-Help/Support Groups °Organization         Address  Phone             Notes  °[facility] [place] - variety of support groups  336- 373-1402 Call for more information  °Narcotics Anonymous (NA), [facility] [address], °[place]  2 meetings at this location  ° °Residential Treatment Programs °Organization         Address  Phone  Notes  °[facility] [address],    °[place]  1-866-801-8205   °[facility] ° [address], [place], [place] 704-293-8524   °[facility] [address], [place] 336-845-3988 Admissions: 8am-3pm M-F  °[facility] [address]    °[place], [place] 336-841-1104   °[facility] [address], [place], [place] 336-379-7146   °[facility] [address]  °[place], [place] 336-285-9073   °[facility] Intensive Outpatient [address], [place], [place] 336-852-3033   °[facility] [address]  °[place], [place] 1-877-615-2722 or 336-784-9470   °[facility] [address] [place], [place] 336-227-7417 Accepts Medicaid  °[facility] [address]  °[place] 1-800-659-3381 Substance Abuse/Addiction Treatment  ° °[place] Behavioral Health Resources °Organization         Address  Phone  Notes  °[facility]  (888) 581-9988   °Julie Brannon, PhD [address] [place], [place]   (336) 349-5553 or (336) 951-0000   °[facility]   [address] °[place], [place] (336) 349-4454   °[facility] [address], [place], [place] (336) 342-8316 Insurance/Medicaid/sponsorship through [facility]  °[facility] [address]                                    [place], [place] (336) 342-8316 Therapy/tele-psych/case  °[facility] [address]  ° [place], [place] (336) 349-2233    °Dr. Arfeen  (336) 349-4544   °[facility] [place]  [employer] [place] [facility] 1) [address], [place] °2) [address], [place] °3)  [address], [place] (336) 349-3220 °(336) 342-7768 ° °(336) 342-8140   °[place] [facility] (336) 342-1394 or (336) 342-3537 (After Hours)    ° °   °

## 2015-01-26 NOTE — ED Notes (Signed)
[facility] regarding [facility] planning.

## 2015-01-26 NOTE — ED Notes (Signed)
[facility] Julieanne Cottonina[facility], [facility].  Inetta Fermoina spoke with NP who assessed pt and pt was cleared for discharge.  Pt presently leaving with [facility] deputy.

## 2015-02-15 DIAGNOSIS — E785 Hyperlipidemia, unspecified: Secondary | ICD-10-CM | POA: Diagnosis not present

## 2015-02-15 DIAGNOSIS — Z79899 Other long term (current) drug therapy: Secondary | ICD-10-CM | POA: Diagnosis not present

## 2015-02-15 DIAGNOSIS — R531 Weakness: Secondary | ICD-10-CM | POA: Diagnosis not present

## 2015-04-04 DIAGNOSIS — Z23 Encounter for immunization: Secondary | ICD-10-CM | POA: Diagnosis not present

## 2015-06-01 DIAGNOSIS — F419 Anxiety disorder, unspecified: Secondary | ICD-10-CM | POA: Diagnosis not present

## 2015-06-01 DIAGNOSIS — J209 Acute bronchitis, unspecified: Secondary | ICD-10-CM | POA: Diagnosis not present

## 2015-06-01 DIAGNOSIS — J069 Acute upper respiratory infection, unspecified: Secondary | ICD-10-CM | POA: Diagnosis not present

## 2015-06-05 ENCOUNTER — Other Ambulatory Visit (HOSPITAL_COMMUNITY): Payer: Self-pay | Admitting: Family Medicine

## 2015-06-19 ENCOUNTER — Encounter (HOSPITAL_COMMUNITY): Payer: Medicare Other

## 2015-06-19 ENCOUNTER — Telehealth (HOSPITAL_COMMUNITY): Payer: Self-pay | Admitting: *Deleted

## 2015-08-27 ENCOUNTER — Encounter: Payer: Self-pay | Admitting: Family Medicine

## 2015-08-27 ENCOUNTER — Ambulatory Visit (INDEPENDENT_AMBULATORY_CARE_PROVIDER_SITE_OTHER): Payer: Medicare Other | Admitting: Family Medicine

## 2015-08-27 ENCOUNTER — Telehealth: Payer: Self-pay | Admitting: Family Medicine

## 2015-08-27 DIAGNOSIS — F419 Anxiety disorder, unspecified: Secondary | ICD-10-CM

## 2015-08-27 DIAGNOSIS — K219 Gastro-esophageal reflux disease without esophagitis: Secondary | ICD-10-CM | POA: Diagnosis not present

## 2015-08-27 DIAGNOSIS — E785 Hyperlipidemia, unspecified: Secondary | ICD-10-CM | POA: Diagnosis not present

## 2015-08-27 MED ORDER — DULOXETINE HCL 20 MG PO CPEP: 20.0000 mg | ORAL_CAPSULE | Freq: Every day | ORAL | Status: DC

## 2015-08-27 NOTE — Patient Instructions (Signed)
Great to meet you!  We have started duloxetine today, Start 20 mg once daily.   Come back in 3 weeks.

## 2015-08-27 NOTE — Progress Notes (Signed)
   HPI  Patient presents today here to establish care.  Patient explains thatshe has a history of asthma, anxiety.  She explains that she takes 1 mg Ativan, 3 times daily sometimes a fourth dosefor anxiety. She's not tried any anxiety medicines other than this.  She also has asthma, she has good response to albuterol. She describes worsening symptoms over last 8-9 months. She has nighttime cough. She has recently used a controller medication which caused hoarseness.    PMH: Smoking status noted past medical, social, surgical,family history reviewed and updated in EMR ROS: Per HPI  Objective: There were no vitals taken for this visit. Gen: NAD, alert, cooperative with exam HEENT: NCAT, TM on the right normal, left obscured by cerumen, nares clear, oropharynx clear CV: RRR, good S1/S2, no murmur Resp: moderate air movement, expiratory wheezes or rales Ext: No edema, warm Neuro: Alert and oriented, No gross deficits  Assessment and plan:  # asthma Reacting well to albuterol, believe that she needs a controllernhaler, she will see herallergist in the next6 weeks.  # anxiety Start Cymbalta, 20 mg Limit Ativan to 1 mg tablets 3 times daily, discussed that we will have to start decreasing the dose in 2-3 months. Increase Cymbalta next visit to start very low-dose and advance slowly given age  # HLD Crestor- continue LDL today  GERD 2/2 hiatal hernia Omeprazole- continue   Orders Placed This Encounter  Procedures  . CMP14+EGFR  . LDL Cholesterol, Direct    Meds ordered this encounter  Medications  . XOPENEX HFA 45 MCG/ACT inhaler    Sig:   . vitamin C (ASCORBIC ACID) 500 MG tablet    Sig: Take 500 mg by mouth daily.  . vitamin E 400 UNIT capsule    Sig: Take 400 Units by mouth daily.  . DULoxetine (CYMBALTA) 20 MG capsule    Sig: Take 1 capsule (20 mg total) by mouth daily.    Dispense:  30 capsule    Refill:  [place], MD [facility] 08/27/2015, 3:00 PM

## 2015-08-27 NOTE — Telephone Encounter (Signed)
Stp's daughter and reviewed how to take cymbalta and lorazepam. Pt's daughter voiced understanding.

## 2015-08-28 LAB — CMP14+EGFR
A/G RATIO: 2.1 (ref 1.1–2.5)
ALT: 20 IU/L (ref 0–32)
AST: 25 IU/L (ref 0–40)
Albumin: 4.8 g/dL — ABNORMAL HIGH (ref 3.5–4.7)
Alkaline Phosphatase: 63 IU/L (ref 39–117)
BUN/Creatinine Ratio: 14 (ref 11–26)
BUN: 9 mg/dL (ref 8–27)
Bilirubin Total: 0.4 mg/dL (ref 0.0–1.2)
CALCIUM: 9.4 mg/dL (ref 8.7–10.3)
CO2: 27 mmol/L (ref 18–29)
CREATININE: 0.66 mg/dL (ref 0.57–1.00)
Chloride: 100 mmol/L (ref 96–106)
GFR calc Af Amer: 95 mL/min/{1.73_m2} (ref >59)
GFR calc non Af Amer: 83 mL/min/{1.73_m2} (ref >59)
GLUCOSE: 100 mg/dL — AB (ref 65–99)
Globulin, Total: 2.3 g/dL (ref 1.5–4.5)
POTASSIUM: 4.6 mmol/L (ref 3.5–5.2)
Sodium: 141 mmol/L (ref 134–144)
Total Protein: 7.1 g/dL (ref 6.0–8.5)

## 2015-08-28 LAB — LDL CHOLESTEROL, DIRECT: LDL Direct: 81 mg/dL (ref 0–99)

## 2015-08-31 ENCOUNTER — Encounter: Payer: Self-pay | Admitting: Family Medicine

## 2015-09-03 ENCOUNTER — Encounter: Payer: Self-pay | Admitting: Family Medicine

## 2015-09-20 ENCOUNTER — Ambulatory Visit: Payer: Medicare Other | Admitting: Family Medicine

## 2015-09-24 ENCOUNTER — Ambulatory Visit (INDEPENDENT_AMBULATORY_CARE_PROVIDER_SITE_OTHER): Payer: Medicare Other | Admitting: Family Medicine

## 2015-09-24 ENCOUNTER — Ambulatory Visit (INDEPENDENT_AMBULATORY_CARE_PROVIDER_SITE_OTHER): Payer: Medicare Other

## 2015-09-24 ENCOUNTER — Encounter: Payer: Self-pay | Admitting: Family Medicine

## 2015-09-24 VITALS — BP 122/72 | HR 73 | Temp 97.3°F | Ht 64.0 in | Wt 157.2 lb

## 2015-09-24 DIAGNOSIS — F419 Anxiety disorder, unspecified: Secondary | ICD-10-CM | POA: Diagnosis not present

## 2015-09-24 DIAGNOSIS — J45909 Unspecified asthma, uncomplicated: Secondary | ICD-10-CM | POA: Diagnosis not present

## 2015-09-24 DIAGNOSIS — E785 Hyperlipidemia, unspecified: Secondary | ICD-10-CM

## 2015-09-24 DIAGNOSIS — J45901 Unspecified asthma with (acute) exacerbation: Secondary | ICD-10-CM

## 2015-09-24 MED ORDER — LORAZEPAM 1 MG PO TABS: 1.0000 mg | ORAL_TABLET | Freq: Two times a day (BID) | ORAL | Status: DC | PRN

## 2015-09-24 MED ORDER — OMEPRAZOLE 20 MG PO CPDR: 20.0000 mg | DELAYED_RELEASE_CAPSULE | Freq: Every day | ORAL | Status: DC

## 2015-09-24 MED ORDER — PREDNISONE 10 MG PO TABS: ORAL_TABLET | ORAL | Status: DC

## 2015-09-24 MED ORDER — DULOXETINE HCL 20 MG PO CPEP: 20.0000 mg | ORAL_CAPSULE | Freq: Every day | ORAL | Status: DC

## 2015-09-24 NOTE — Progress Notes (Signed)
   HPI  Patient presents today here for follow-up anxiety, cough with asthma.  Anxiety Doing better with Cymbalta, no suicidal ideation, GI upset, or depression. She has cut back to 2 Ativan today. She states that her anxiety overall feels better.  Asthma with bronchitis She describes a 3 to four-month cough. She states that at times she coughs up thick yellow sputum,  denies chest pain or fevers, for malaise. lbuterol is helping.  PMH: Smoking status noted ROS: Per HPI  Objective: BP 122/72 mmHg  Pulse 73  Temp(Src) 97.3 F (36.3 C) (Oral)  Ht 5\' 4"  (1.626 m)  Wt 157 lb 3.2 oz (71.305 kg)  BMI 26.97 kg/m2 Gen: NAD, alert, cooperative with exam HEENT: NCAT< TM on R WNL, L obscured CV: RRR, good S1/S2, no murmur Resp:exp wheezes throughout, L lower lung field with crackles and persistent rhoncus Ext: No edema, warm Neuro: Alert and oriented, No gross deficits   CXR- Hyper-expanded, otherwise clear  Assessment and plan:  # Cough, Asthma Treating as exacerbation No clear signs of CAP, but considering impressive lung exam will eval with CXR with hyperexpansion, possible R sided atelectasis- awaiting read fro radiology Prednisone Albuterol  # Anxiety  Doing well with duloxetine Cut back to 1 mg BID from TID- next goal decrease to 0.5 mg  HLD Tolerating generic crestor, recent cahnge to generic Labs reviewed, plan Q 6 month re-check    Meds ordered this encounter  Medications  . predniSONE (DELTASONE) 10 MG tablet    Sig: Take 4 pills a day for 3 days, then 3 pills a day for 3 days, then 2 a day for 3 days, then 1 a day for 3 days    Dispense:  30 tablet    Refill:  0  . LORazepam (ATIVAN) 1 MG tablet    Sig: Take 1 tablet (1 mg total) by mouth every 12 (twelve) hours as needed for anxiety.    Dispense:  60 tablet    Refill:  2  . DULoxetine (CYMBALTA) 20 MG capsule    Sig: Take 1 capsule (20 mg total) by mouth daily.    Dispense:  30 capsule    Refill:   5  . omeprazole (PRILOSEC) 20 MG capsule    Sig: Take 1 capsule (20 mg total) by mouth daily.    Dispense:  30 capsule    Refill:  5    Murtis SinkSam Bradshaw, MD [facility] 09/24/2015, 4:25 PM

## 2015-09-24 NOTE — Patient Instructions (Signed)
Great to see you!  Come back in 2-3 months  Tale the prednisone for your breathing, hopefully by the end of the course your breathing will be better.

## 2015-10-03 DIAGNOSIS — H04123 Dry eye syndrome of bilateral lacrimal glands: Secondary | ICD-10-CM | POA: Diagnosis not present

## 2015-10-29 ENCOUNTER — Telehealth: Payer: Self-pay | Admitting: Family Medicine

## 2015-10-29 NOTE — Telephone Encounter (Signed)
Spoke to pt and she has been having 2-4 loose stools every day since March 19th. Pt hasn't tried to take anything otc to help with diarrhea. Advised pt to try some pepto or immodium and if it doesn't help will ntbs for evaluation. Pt voiced understanding.

## 2015-11-01 ENCOUNTER — Telehealth: Payer: Self-pay | Admitting: Family Medicine

## 2015-11-02 NOTE — Telephone Encounter (Signed)
Called patient and she states that she is feeling a lot better today and doesn't need to speak with anyone now.

## 2015-11-12 DIAGNOSIS — K219 Gastro-esophageal reflux disease without esophagitis: Secondary | ICD-10-CM | POA: Diagnosis not present

## 2015-11-12 DIAGNOSIS — J3089 Other allergic rhinitis: Secondary | ICD-10-CM | POA: Diagnosis not present

## 2015-11-12 DIAGNOSIS — J4530 Mild persistent asthma, uncomplicated: Secondary | ICD-10-CM | POA: Diagnosis not present

## 2015-11-12 DIAGNOSIS — J449 Chronic obstructive pulmonary disease, unspecified: Secondary | ICD-10-CM | POA: Diagnosis not present

## 2015-11-30 ENCOUNTER — Inpatient Hospital Stay (HOSPITAL_COMMUNITY): Payer: Medicare Other

## 2015-11-30 ENCOUNTER — Encounter (HOSPITAL_COMMUNITY)
Admission: EM | Disposition: A | Discharge status: Skilled Nursing Facility | Payer: Self-pay | Source: Home / Self Care | Attending: Internal Medicine

## 2015-11-30 ENCOUNTER — Inpatient Hospital Stay (HOSPITAL_COMMUNITY): Payer: Medicare Other | Admitting: Anesthesiology

## 2015-11-30 ENCOUNTER — Inpatient Hospital Stay (HOSPITAL_COMMUNITY)
Admission: EM | Admit: 2015-11-30 | Discharge: 2015-12-03 | DRG: 481 | Disposition: A | Discharge status: Skilled Nursing Facility | Payer: Medicare Other | Attending: Internal Medicine | Admitting: Internal Medicine

## 2015-11-30 ENCOUNTER — Emergency Department (HOSPITAL_COMMUNITY): Payer: Medicare Other

## 2015-11-30 ENCOUNTER — Encounter (HOSPITAL_COMMUNITY): Payer: Self-pay | Admitting: Emergency Medicine

## 2015-11-30 DIAGNOSIS — S72009A Fracture of unspecified part of neck of unspecified femur, initial encounter for closed fracture: Secondary | ICD-10-CM | POA: Diagnosis not present

## 2015-11-30 DIAGNOSIS — K449 Diaphragmatic hernia without obstruction or gangrene: Secondary | ICD-10-CM | POA: Diagnosis present

## 2015-11-30 DIAGNOSIS — Z8249 Family history of ischemic heart disease and other diseases of the circulatory system: Secondary | ICD-10-CM

## 2015-11-30 DIAGNOSIS — F419 Anxiety disorder, unspecified: Secondary | ICD-10-CM | POA: Diagnosis present

## 2015-11-30 DIAGNOSIS — J45909 Unspecified asthma, uncomplicated: Secondary | ICD-10-CM | POA: Diagnosis present

## 2015-11-30 DIAGNOSIS — M79604 Pain in right leg: Secondary | ICD-10-CM | POA: Diagnosis not present

## 2015-11-30 DIAGNOSIS — R079 Chest pain, unspecified: Secondary | ICD-10-CM | POA: Diagnosis not present

## 2015-11-30 DIAGNOSIS — D62 Acute posthemorrhagic anemia: Secondary | ICD-10-CM

## 2015-11-30 DIAGNOSIS — M810 Age-related osteoporosis without current pathological fracture: Secondary | ICD-10-CM | POA: Diagnosis present

## 2015-11-30 DIAGNOSIS — M6281 Muscle weakness (generalized): Secondary | ICD-10-CM | POA: Diagnosis not present

## 2015-11-30 DIAGNOSIS — S72141A Displaced intertrochanteric fracture of right femur, initial encounter for closed fracture: Secondary | ICD-10-CM | POA: Diagnosis not present

## 2015-11-30 DIAGNOSIS — W19XXXD Unspecified fall, subsequent encounter: Secondary | ICD-10-CM | POA: Diagnosis not present

## 2015-11-30 DIAGNOSIS — S72001D Fracture of unspecified part of neck of right femur, subsequent encounter for closed fracture with routine healing: Secondary | ICD-10-CM | POA: Diagnosis not present

## 2015-11-30 DIAGNOSIS — H2510 Age-related nuclear cataract, unspecified eye: Secondary | ICD-10-CM | POA: Diagnosis not present

## 2015-11-30 DIAGNOSIS — Z720 Tobacco use: Secondary | ICD-10-CM | POA: Diagnosis not present

## 2015-11-30 DIAGNOSIS — W010XXA Fall on same level from slipping, tripping and stumbling without subsequent striking against object, initial encounter: Secondary | ICD-10-CM | POA: Diagnosis present

## 2015-11-30 DIAGNOSIS — Z419 Encounter for procedure for purposes other than remedying health state, unspecified: Secondary | ICD-10-CM

## 2015-11-30 DIAGNOSIS — I4510 Unspecified right bundle-branch block: Secondary | ICD-10-CM | POA: Diagnosis present

## 2015-11-30 DIAGNOSIS — Z9049 Acquired absence of other specified parts of digestive tract: Secondary | ICD-10-CM

## 2015-11-30 DIAGNOSIS — R270 Ataxia, unspecified: Secondary | ICD-10-CM | POA: Diagnosis not present

## 2015-11-30 DIAGNOSIS — S3992XA Unspecified injury of lower back, initial encounter: Secondary | ICD-10-CM | POA: Diagnosis not present

## 2015-11-30 DIAGNOSIS — E785 Hyperlipidemia, unspecified: Secondary | ICD-10-CM | POA: Diagnosis present

## 2015-11-30 DIAGNOSIS — M25551 Pain in right hip: Secondary | ICD-10-CM | POA: Diagnosis not present

## 2015-11-30 DIAGNOSIS — R52 Pain, unspecified: Secondary | ICD-10-CM | POA: Diagnosis not present

## 2015-11-30 DIAGNOSIS — W19XXXA Unspecified fall, initial encounter: Secondary | ICD-10-CM | POA: Diagnosis not present

## 2015-11-30 DIAGNOSIS — F339 Major depressive disorder, recurrent, unspecified: Secondary | ICD-10-CM | POA: Diagnosis not present

## 2015-11-30 DIAGNOSIS — F17200 Nicotine dependence, unspecified, uncomplicated: Secondary | ICD-10-CM

## 2015-11-30 DIAGNOSIS — Z8261 Family history of arthritis: Secondary | ICD-10-CM | POA: Diagnosis not present

## 2015-11-30 DIAGNOSIS — I2510 Atherosclerotic heart disease of native coronary artery without angina pectoris: Secondary | ICD-10-CM | POA: Diagnosis present

## 2015-11-30 DIAGNOSIS — K219 Gastro-esophageal reflux disease without esophagitis: Secondary | ICD-10-CM | POA: Diagnosis not present

## 2015-11-30 DIAGNOSIS — J4520 Mild intermittent asthma, uncomplicated: Secondary | ICD-10-CM | POA: Diagnosis not present

## 2015-11-30 DIAGNOSIS — S51011A Laceration without foreign body of right elbow, initial encounter: Secondary | ICD-10-CM | POA: Diagnosis present

## 2015-11-30 DIAGNOSIS — Y92017 Garden or yard in single-family (private) house as the place of occurrence of the external cause: Secondary | ICD-10-CM

## 2015-11-30 DIAGNOSIS — K460 Unspecified abdominal hernia with obstruction, without gangrene: Secondary | ICD-10-CM | POA: Diagnosis not present

## 2015-11-30 DIAGNOSIS — R2689 Other abnormalities of gait and mobility: Secondary | ICD-10-CM | POA: Diagnosis not present

## 2015-11-30 DIAGNOSIS — D6489 Other specified anemias: Secondary | ICD-10-CM | POA: Diagnosis not present

## 2015-11-30 DIAGNOSIS — K5900 Constipation, unspecified: Secondary | ICD-10-CM | POA: Diagnosis not present

## 2015-11-30 DIAGNOSIS — S72141D Displaced intertrochanteric fracture of right femur, subsequent encounter for closed fracture with routine healing: Secondary | ICD-10-CM | POA: Diagnosis not present

## 2015-11-30 DIAGNOSIS — D508 Other iron deficiency anemias: Secondary | ICD-10-CM | POA: Diagnosis not present

## 2015-11-30 DIAGNOSIS — R279 Unspecified lack of coordination: Secondary | ICD-10-CM | POA: Diagnosis not present

## 2015-11-30 DIAGNOSIS — S299XXA Unspecified injury of thorax, initial encounter: Secondary | ICD-10-CM | POA: Diagnosis not present

## 2015-11-30 DIAGNOSIS — R9431 Abnormal electrocardiogram [ECG] [EKG]: Secondary | ICD-10-CM | POA: Diagnosis not present

## 2015-11-30 DIAGNOSIS — R531 Weakness: Secondary | ICD-10-CM | POA: Diagnosis not present

## 2015-11-30 DIAGNOSIS — M818 Other osteoporosis without current pathological fracture: Secondary | ICD-10-CM | POA: Diagnosis not present

## 2015-11-30 DIAGNOSIS — Z743 Need for continuous supervision: Secondary | ICD-10-CM | POA: Diagnosis not present

## 2015-11-30 HISTORY — PX: INTRAMEDULLARY (IM) NAIL INTERTROCHANTERIC: SHX5875

## 2015-11-30 LAB — BASIC METABOLIC PANEL
ANION GAP: 8 (ref 5–15)
BUN: 8 mg/dL (ref 6–20)
CALCIUM: 9.1 mg/dL (ref 8.9–10.3)
CO2: 25 mmol/L (ref 22–32)
CREATININE: 0.76 mg/dL (ref 0.44–1.00)
Chloride: 108 mmol/L (ref 101–111)
Glucose, Bld: 146 mg/dL — ABNORMAL HIGH (ref 65–99)
Potassium: 3.4 mmol/L — ABNORMAL LOW (ref 3.5–5.1)
SODIUM: 141 mmol/L (ref 135–145)

## 2015-11-30 LAB — CBC WITH DIFFERENTIAL/PLATELET
BASOS ABS: 0 10*3/uL (ref 0.0–0.1)
BASOS PCT: 0 %
EOS ABS: 0.2 10*3/uL (ref 0.0–0.7)
Eosinophils Relative: 1 %
HEMATOCRIT: 36.7 % (ref 36.0–46.0)
HEMOGLOBIN: 12.6 g/dL (ref 12.0–15.0)
Lymphocytes Relative: 18 %
Lymphs Abs: 2.2 10*3/uL (ref 0.7–4.0)
MCH: 32 pg (ref 26.0–34.0)
MCHC: 34.3 g/dL (ref 30.0–36.0)
MCV: 93.1 fL (ref 78.0–100.0)
Monocytes Absolute: 0.7 10*3/uL (ref 0.1–1.0)
Monocytes Relative: 6 %
NEUTROS ABS: 9.2 10*3/uL — AB (ref 1.7–7.7)
NEUTROS PCT: 75 %
Platelets: 185 10*3/uL (ref 150–400)
RBC: 3.94 MIL/uL (ref 3.87–5.11)
RDW: 12.8 % (ref 11.5–15.5)
WBC: 12.2 10*3/uL — AB (ref 4.0–10.5)

## 2015-11-30 LAB — PROTIME-INR
INR: 1.17 (ref 0.00–1.49)
PROTHROMBIN TIME: 14.7 s (ref 11.6–15.2)

## 2015-11-30 LAB — ABO/RH: ABO/RH(D): O NEG

## 2015-11-30 SURGERY — FIXATION, FRACTURE, INTERTROCHANTERIC, WITH INTRAMEDULLARY ROD
Anesthesia: General | Site: Hip | Laterality: Right

## 2015-11-30 MED ORDER — SODIUM CHLORIDE 0.9 % IV SOLN
INTRAVENOUS | Status: DC
  Administered 2015-11-30 – 2015-12-01 (×2): via INTRAVENOUS

## 2015-11-30 MED ORDER — HYDROCODONE-ACETAMINOPHEN 5-325 MG PO TABS
1.0000 | ORAL_TABLET | ORAL | Status: AC | PRN
  Administered 2015-12-01: 2 via ORAL
  Filled 2015-11-30 (×2): qty 2

## 2015-11-30 MED ORDER — DEXAMETHASONE SODIUM PHOSPHATE 10 MG/ML IJ SOLN
INTRAMUSCULAR | Status: AC
  Filled 2015-11-30: qty 1

## 2015-11-30 MED ORDER — FENTANYL CITRATE (PF) 100 MCG/2ML IJ SOLN
25.0000 ug | INTRAMUSCULAR | Status: DC | PRN
  Administered 2015-11-30 (×2): 25 ug via INTRAVENOUS

## 2015-11-30 MED ORDER — ONDANSETRON HCL 4 MG/2ML IJ SOLN
INTRAMUSCULAR | Status: DC | PRN
  Administered 2015-11-30: 4 mg via INTRAVENOUS

## 2015-11-30 MED ORDER — ONDANSETRON HCL 4 MG/2ML IJ SOLN: 4.0000 mg | Freq: Three times a day (TID) | INTRAMUSCULAR | Status: AC | PRN

## 2015-11-30 MED ORDER — CEFAZOLIN SODIUM-DEXTROSE 2-4 GM/100ML-% IV SOLN
INTRAVENOUS | Status: AC
  Filled 2015-11-30: qty 100

## 2015-11-30 MED ORDER — METOCLOPRAMIDE HCL 5 MG PO TABS: 5.0000 mg | ORAL_TABLET | Freq: Three times a day (TID) | ORAL | Status: DC | PRN

## 2015-11-30 MED ORDER — ALBUTEROL SULFATE (2.5 MG/3ML) 0.083% IN NEBU: 2.0000 mL | INHALATION_SOLUTION | Freq: Three times a day (TID) | RESPIRATORY_TRACT | Status: DC | PRN

## 2015-11-30 MED ORDER — DOCUSATE SODIUM 100 MG PO CAPS
100.0000 mg | ORAL_CAPSULE | Freq: Two times a day (BID) | ORAL | Status: DC
  Administered 2015-11-30 – 2015-12-03 (×5): 100 mg via ORAL
  Filled 2015-11-30 (×6): qty 1

## 2015-11-30 MED ORDER — COUMADIN BOOK
Freq: Once | Status: AC
  Administered 2015-11-30: 23:00:00
  Filled 2015-11-30: qty 1

## 2015-11-30 MED ORDER — PROPOFOL 10 MG/ML IV BOLUS
INTRAVENOUS | Status: DC | PRN
  Administered 2015-11-30: 100 mg via INTRAVENOUS

## 2015-11-30 MED ORDER — CEFAZOLIN SODIUM-DEXTROSE 2-4 GM/100ML-% IV SOLN
2.0000 g | Freq: Four times a day (QID) | INTRAVENOUS | Status: AC
  Administered 2015-11-30 – 2015-12-01 (×2): 2 g via INTRAVENOUS
  Filled 2015-11-30 (×2): qty 100

## 2015-11-30 MED ORDER — 0.9 % SODIUM CHLORIDE (POUR BTL) OPTIME
TOPICAL | Status: DC | PRN
  Administered 2015-11-30: 1000 mL

## 2015-11-30 MED ORDER — ACETAMINOPHEN 325 MG PO TABS
650.0000 mg | ORAL_TABLET | Freq: Four times a day (QID) | ORAL | Status: DC | PRN
  Administered 2015-11-30 – 2015-12-03 (×5): 650 mg via ORAL
  Filled 2015-11-30 (×5): qty 2

## 2015-11-30 MED ORDER — METHOCARBAMOL 1000 MG/10ML IJ SOLN
500.0000 mg | Freq: Four times a day (QID) | INTRAVENOUS | Status: DC | PRN
  Administered 2015-12-01: 500 mg via INTRAVENOUS
  Filled 2015-11-30: qty 550
  Filled 2015-11-30: qty 5

## 2015-11-30 MED ORDER — DIPHENOXYLATE-ATROPINE 2.5-0.025 MG/5ML PO LIQD
5.0000 mL | Freq: Once | ORAL | Status: DC
  Filled 2015-11-30: qty 5

## 2015-11-30 MED ORDER — METHOCARBAMOL 500 MG PO TABS
500.0000 mg | ORAL_TABLET | Freq: Four times a day (QID) | ORAL | Status: DC | PRN
  Administered 2015-12-01 – 2015-12-03 (×4): 500 mg via ORAL
  Filled 2015-11-30 (×4): qty 1

## 2015-11-30 MED ORDER — ONDANSETRON HCL 4 MG/2ML IJ SOLN: 4.0000 mg | Freq: Four times a day (QID) | INTRAMUSCULAR | Status: DC | PRN

## 2015-11-30 MED ORDER — ONDANSETRON HCL 4 MG/2ML IJ SOLN
4.0000 mg | Freq: Once | INTRAMUSCULAR | Status: AC
  Administered 2015-11-30: 4 mg via INTRAVENOUS
  Filled 2015-11-30: qty 2

## 2015-11-30 MED ORDER — LORAZEPAM 1 MG PO TABS
1.0000 mg | ORAL_TABLET | Freq: Two times a day (BID) | ORAL | Status: DC | PRN
  Administered 2015-12-01 (×2): 1 mg via ORAL
  Filled 2015-11-30 (×2): qty 1

## 2015-11-30 MED ORDER — FENTANYL CITRATE (PF) 100 MCG/2ML IJ SOLN
INTRAMUSCULAR | Status: AC
  Filled 2015-11-30: qty 2

## 2015-11-30 MED ORDER — FENTANYL CITRATE (PF) 100 MCG/2ML IJ SOLN
INTRAMUSCULAR | Status: DC | PRN
  Administered 2015-11-30: 25 ug via INTRAVENOUS
  Administered 2015-11-30: 50 ug via INTRAVENOUS
  Administered 2015-11-30: 25 ug via INTRAVENOUS
  Administered 2015-11-30 (×2): 50 ug via INTRAVENOUS

## 2015-11-30 MED ORDER — METOCLOPRAMIDE HCL 5 MG/ML IJ SOLN: 5.0000 mg | Freq: Three times a day (TID) | INTRAMUSCULAR | Status: DC | PRN

## 2015-11-30 MED ORDER — PHENOL 1.4 % MT LIQD
1.0000 | OROMUCOSAL | Status: DC | PRN
Start: 2015-11-30 — End: 2015-12-03

## 2015-11-30 MED ORDER — ROCURONIUM BROMIDE 50 MG/5ML IV SOLN
INTRAVENOUS | Status: AC
  Filled 2015-11-30: qty 1

## 2015-11-30 MED ORDER — ALUM & MAG HYDROXIDE-SIMETH 200-200-20 MG/5ML PO SUSP: 30.0000 mL | ORAL | Status: DC | PRN

## 2015-11-30 MED ORDER — DEXAMETHASONE SODIUM PHOSPHATE 10 MG/ML IJ SOLN
INTRAMUSCULAR | Status: DC | PRN
  Administered 2015-11-30: 10 mg via INTRAVENOUS

## 2015-11-30 MED ORDER — KETOTIFEN FUMARATE 0.025 % OP SOLN
1.0000 [drp] | Freq: Two times a day (BID) | OPHTHALMIC | Status: DC
  Filled 2015-11-30: qty 5

## 2015-11-30 MED ORDER — WARFARIN - PHARMACIST DOSING INPATIENT: Freq: Every day | Status: DC

## 2015-11-30 MED ORDER — ROCURONIUM BROMIDE 100 MG/10ML IV SOLN
INTRAVENOUS | Status: DC | PRN
  Administered 2015-11-30: 25 mg via INTRAVENOUS

## 2015-11-30 MED ORDER — SUGAMMADEX SODIUM 200 MG/2ML IV SOLN
INTRAVENOUS | Status: DC | PRN
  Administered 2015-11-30: 200 mg via INTRAVENOUS

## 2015-11-30 MED ORDER — WARFARIN SODIUM 4 MG PO TABS
4.0000 mg | ORAL_TABLET | Freq: Once | ORAL | Status: AC
  Administered 2015-11-30: 4 mg via ORAL
  Filled 2015-11-30: qty 1

## 2015-11-30 MED ORDER — SODIUM CHLORIDE 0.9 % IV SOLN
INTRAVENOUS | Status: DC
  Administered 2015-12-01: 23:00:00 via INTRAVENOUS

## 2015-11-30 MED ORDER — ONDANSETRON HCL 4 MG PO TABS
4.0000 mg | ORAL_TABLET | Freq: Four times a day (QID) | ORAL | Status: DC | PRN
  Administered 2015-12-03: 4 mg via ORAL
  Filled 2015-11-30: qty 1

## 2015-11-30 MED ORDER — PROMETHAZINE HCL 25 MG/ML IJ SOLN
INTRAMUSCULAR | Status: AC
  Filled 2015-11-30: qty 1

## 2015-11-30 MED ORDER — SUGAMMADEX SODIUM 200 MG/2ML IV SOLN
INTRAVENOUS | Status: AC
  Filled 2015-11-30: qty 2

## 2015-11-30 MED ORDER — PANTOPRAZOLE SODIUM 40 MG PO TBEC
40.0000 mg | DELAYED_RELEASE_TABLET | Freq: Every day | ORAL | Status: DC
  Administered 2015-12-01 – 2015-12-03 (×3): 40 mg via ORAL
  Filled 2015-11-30 (×3): qty 1

## 2015-11-30 MED ORDER — WARFARIN VIDEO
Freq: Once | Status: AC
  Administered 2015-11-30: 23:00:00

## 2015-11-30 MED ORDER — MENTHOL 3 MG MT LOZG
1.0000 | LOZENGE | OROMUCOSAL | Status: DC | PRN
Start: 2015-11-30 — End: 2015-12-03

## 2015-11-30 MED ORDER — ONDANSETRON HCL 4 MG/2ML IJ SOLN
INTRAMUSCULAR | Status: AC
  Filled 2015-11-30: qty 2

## 2015-11-30 MED ORDER — TETANUS-DIPHTHERIA TOXOIDS TD 5-2 LFU IM INJ
0.5000 mL | INJECTION | Freq: Once | INTRAMUSCULAR | Status: AC
  Administered 2015-11-30: 0.5 mL via INTRAMUSCULAR
  Filled 2015-11-30: qty 0.5

## 2015-11-30 MED ORDER — ACETAMINOPHEN 650 MG RE SUPP: 650.0000 mg | Freq: Four times a day (QID) | RECTAL | Status: DC | PRN

## 2015-11-30 MED ORDER — DULOXETINE HCL 20 MG PO CPEP
20.0000 mg | ORAL_CAPSULE | Freq: Every day | ORAL | Status: DC
  Administered 2015-12-01 – 2015-12-03 (×3): 20 mg via ORAL
  Filled 2015-11-30 (×3): qty 1

## 2015-11-30 MED ORDER — POTASSIUM CHLORIDE CRYS ER 20 MEQ PO TBCR: 40.0000 meq | EXTENDED_RELEASE_TABLET | Freq: Once | ORAL | Status: DC

## 2015-11-30 MED ORDER — HYDROMORPHONE HCL 1 MG/ML IJ SOLN: 0.5000 mg | INTRAMUSCULAR | Status: DC | PRN

## 2015-11-30 MED ORDER — FENTANYL CITRATE (PF) 100 MCG/2ML IJ SOLN
50.0000 ug | Freq: Once | INTRAMUSCULAR | Status: AC
Start: 2015-11-30 — End: 2015-11-30
  Administered 2015-11-30: 50 ug via INTRAVENOUS
  Filled 2015-11-30: qty 2

## 2015-11-30 MED ORDER — CEFAZOLIN SODIUM-DEXTROSE 2-4 GM/100ML-% IV SOLN
2.0000 g | Freq: Once | INTRAVENOUS | Status: AC
  Administered 2015-11-30: 2 g via INTRAVENOUS
  Filled 2015-11-30: qty 100

## 2015-11-30 MED ORDER — PHENYLEPHRINE 40 MCG/ML (10ML) SYRINGE FOR IV PUSH (FOR BLOOD PRESSURE SUPPORT)
PREFILLED_SYRINGE | INTRAVENOUS | Status: DC | PRN
  Administered 2015-11-30 (×3): 80 ug via INTRAVENOUS

## 2015-11-30 MED ORDER — LIDOCAINE HCL (CARDIAC) 20 MG/ML IV SOLN
INTRAVENOUS | Status: AC
  Filled 2015-11-30: qty 5

## 2015-11-30 MED ORDER — FENTANYL CITRATE (PF) 100 MCG/2ML IJ SOLN
50.0000 ug | INTRAMUSCULAR | Status: AC | PRN
  Administered 2015-11-30 (×2): 50 ug via INTRAVENOUS
  Filled 2015-11-30 (×2): qty 2

## 2015-11-30 MED ORDER — PROPOFOL 10 MG/ML IV BOLUS
INTRAVENOUS | Status: AC
  Filled 2015-11-30: qty 20

## 2015-11-30 MED ORDER — HYDROCODONE-ACETAMINOPHEN 5-325 MG PO TABS: 1.0000 | ORAL_TABLET | Freq: Four times a day (QID) | ORAL | Status: DC | PRN

## 2015-11-30 MED ORDER — LIDOCAINE HCL (PF) 2 % IJ SOLN
INTRAMUSCULAR | Status: DC | PRN
  Administered 2015-11-30: 30 mg via INTRADERMAL

## 2015-11-30 MED ORDER — PHENYLEPHRINE 40 MCG/ML (10ML) SYRINGE FOR IV PUSH (FOR BLOOD PRESSURE SUPPORT)
PREFILLED_SYRINGE | INTRAVENOUS | Status: AC
Start: 2015-11-30 — End: 2015-11-30
  Filled 2015-11-30: qty 10

## 2015-11-30 MED ORDER — PROMETHAZINE HCL 25 MG/ML IJ SOLN
6.2500 mg | Freq: Once | INTRAMUSCULAR | Status: AC
  Administered 2015-11-30: 6.25 mg via INTRAVENOUS

## 2015-11-30 SURGICAL SUPPLY — 30 items
BNDG GAUZE ELAST 4 BULKY (GAUZE/BANDAGES/DRESSINGS) ×3 IMPLANT
COVER PERINEAL POST (MISCELLANEOUS) ×3 IMPLANT
DRAPE STERI IOBAN 125X83 (DRAPES) ×3 IMPLANT
DRSG MEPILEX BORDER 4X4 (GAUZE/BANDAGES/DRESSINGS) ×2 IMPLANT
DRSG MEPILEX BORDER 4X8 (GAUZE/BANDAGES/DRESSINGS) ×3 IMPLANT
DURAPREP 26ML APPLICATOR (WOUND CARE) ×3 IMPLANT
ELECT REM PT RETURN 9FT ADLT (ELECTROSURGICAL) ×3
ELECTRODE REM PT RTRN 9FT ADLT (ELECTROSURGICAL) ×1 IMPLANT
FACESHIELD WRAPAROUND (MASK) ×3 IMPLANT
FACESHIELD WRAPAROUND OR TEAM (MASK) ×1 IMPLANT
GAUZE XEROFORM 5X9 LF (GAUZE/BANDAGES/DRESSINGS) ×3 IMPLANT
GLOVE BIO SURGEON STRL SZ7.5 (GLOVE) ×2 IMPLANT
GLOVE BIOGEL PI IND STRL 8 (GLOVE) ×2 IMPLANT
GLOVE BIOGEL PI INDICATOR 8 (GLOVE) ×2
GOWN STRL REUS W/TWL XL LVL3 (GOWN DISPOSABLE) ×2 IMPLANT
GUIDE PIN 3.2X343 (PIN) ×1
GUIDE PIN 3.2X343MM (PIN) ×3
KIT BASIN OR (CUSTOM PROCEDURE TRAY) ×3 IMPLANT
MANIFOLD NEPTUNE II (INSTRUMENTS) ×3 IMPLANT
NAIL TRIGEN 10MMX36CM-125 RT (Nail) ×2 IMPLANT
PACK GENERAL/GYN (CUSTOM PROCEDURE TRAY) ×3 IMPLANT
PIN GUIDE 3.2X343MM (PIN) IMPLANT
POSITIONER SURGICAL ARM (MISCELLANEOUS) ×2 IMPLANT
SCREW LAG COMPR KIT 95/90 (Screw) ×2 IMPLANT
STAPLER VISISTAT 35W (STAPLE) ×3 IMPLANT
SUT VIC AB 0 CT1 36 (SUTURE) ×2 IMPLANT
SUT VIC AB 1 CT1 36 (SUTURE) ×2 IMPLANT
SUT VIC AB 2-0 CT1 27 (SUTURE) ×3
SUT VIC AB 2-0 CT1 TAPERPNT 27 (SUTURE) ×2 IMPLANT
TOWEL OR 17X26 10 PK STRL BLUE (TOWEL DISPOSABLE) ×3 IMPLANT

## 2015-11-30 NOTE — ED Notes (Signed)
MD at bedside. 

## 2015-11-30 NOTE — ED Notes (Signed)
Patient transported to X-ray 

## 2015-11-30 NOTE — Brief Op Note (Signed)
11/30/2015  6:53 PM  PATIENT:  Mikayla Wilson  82 y.o. female  PRE-OPERATIVE DIAGNOSIS:  RIGHT INTERTROCHANTERIC HIP FRACTURE  POST-OPERATIVE DIAGNOSIS:  RIGHT INTERTROCHANTERIC HIP FRACTURE  PROCEDURE:  Procedure(s): INTRAMEDULLARY (IM) NAIL INTERTROCHANTERIC RIGHT HIP (Right)  SURGEON:  Surgeon(s) and Role:    * Kathryne Hitchhristopher Y Temeka Pore, MD - Primary  ANESTHESIA:   general  EBL:  Total I/O In: -  Out: 375 [Urine:225; Blood:150]  COUNTS:  YES  TOURNIQUET:  * No tourniquets in log *  DICTATION: .Other Dictation: Dictation Number [id number]  PLAN OF CARE: Admit to inpatient   PATIENT DISPOSITION:  PACU - hemodynamically stable.   Delay start of Pharmacological VTE agent (>24hrs) due to surgical blood loss or risk of bleeding: no

## 2015-11-30 NOTE — ED Notes (Signed)
Hospitalist at bedside 

## 2015-11-30 NOTE — Transfer of Care (Signed)
Immediate Anesthesia Transfer of Care Note  Patient: Mikayla Wilson  Procedure(s) Performed: Procedure(s): INTRAMEDULLARY (IM) NAIL INTERTROCHANTERIC RIGHT HIP (Right)  Patient Location: PACU  Anesthesia Type:General  Level of Consciousness:  sedated, patient cooperative and responds to stimulation  Airway & Oxygen Therapy:Patient Spontanous Breathing and Patient connected to face mask oxgen  Post-op Assessment:  Report given to PACU RN and Post -op Vital signs reviewed and stable  Post vital signs:  Reviewed and stable  Last Vitals:  Filed Vitals:   11/30/15 1443 11/30/15 1530  BP: 131/70 134/65  Pulse: 84 86  Temp:    Resp: 21 15    Complications: No apparent anesthesia complications

## 2015-11-30 NOTE — ED Notes (Signed)
Bed: [id number] Expected date:  Expected time:  Means of arrival:  Comments: EMS- 82yo F, fall/R leg injury/hip fracture?

## 2015-11-30 NOTE — Anesthesia Postprocedure Evaluation (Signed)
Anesthesia Post Note  Patient: Mikayla LampJosephine M Wilson  Procedure(s) Performed: Procedure(s) (LRB): INTRAMEDULLARY (IM) NAIL INTERTROCHANTERIC RIGHT HIP (Right)  Patient location during evaluation: PACU Anesthesia Type: General Level of consciousness: awake Pain management: pain level controlled Vital Signs Assessment: post-procedure vital signs reviewed and stable Respiratory status: spontaneous breathing Cardiovascular status: stable Anesthetic complications: no    Last Vitals:  Filed Vitals:   11/30/15 1530 11/30/15 1858  BP: 134/65 131/90  Pulse: 86 114  Temp:  36.6 C  Resp: 15 16    Last Pain:  Filed Vitals:   11/30/15 1902  PainSc: 10-Worst pain ever                 EDWARDS,Dwan Hemmelgarn

## 2015-11-30 NOTE — Anesthesia Preprocedure Evaluation (Addendum)
Anesthesia Evaluation  Patient identified by MRN, date of birth, ID band Patient awake    Reviewed: Allergy & Precautions, NPO status , Patient's Chart, lab work & pertinent test results  Airway Mallampati: II  TM Distance: >3 FB Neck ROM: Full    Dental   Pulmonary asthma ,    breath sounds clear to auscultation       Cardiovascular + CAD   Rhythm:Regular Rate:Normal     Neuro/Psych    GI/Hepatic Neg liver ROS, hiatal hernia, GERD  ,  Endo/Other  negative endocrine ROS  Renal/GU negative Renal ROS     Musculoskeletal  (+) Arthritis ,   Abdominal   Peds  Hematology   Anesthesia Other Findings   Reproductive/Obstetrics                           Anesthesia Physical Anesthesia Plan  ASA: III  Anesthesia Plan: General   Post-op Pain Management:    Induction: Intravenous, Rapid sequence and Cricoid pressure planned  Airway Management Planned: Oral ETT  Additional Equipment:   Intra-op Plan:   Post-operative Plan: Possible Post-op intubation/ventilation  Informed Consent: I have reviewed the patients History and Physical, chart, labs and discussed the procedure including the risks, benefits and alternatives for the proposed anesthesia with the patient or authorized representative who has indicated his/her understanding and acceptance.   Dental advisory given  Plan Discussed with:   Anesthesia Plan Comments:         Anesthesia Quick Evaluation

## 2015-11-30 NOTE — ED Notes (Signed)
Per EMS, patient fell in yard on her "right side" Right leg appears to be shortened and rotated. Patient is complaining of pain only to right hip. Denies hitting head, loss of consciousness, or back/neck pain. Patient has a laceration to right elbow. Bleeding controled. Patient is from home.

## 2015-11-30 NOTE — Consult Note (Signed)
Reason for Consult:  Right hip fracture Referring Physician:   EDP  Mikayla Wilson is an 82 y.o. female.  HPI:   82 yo female who fractured her right hip following a witnessed accidental fall.  Was out in the yard with her family at the time.  She fell directly on her right hip and sustained an intertrochanteric proximal femur fracture.  She was brought to the [facility] ED with the complaint of only right hip pain.  She denies other injuries.  She also denies any syncopal episodes, CP, or SOB.  Past Medical History  Diagnosis Date  . Arthritis   . Coronary artery disease   . Hiatal hernia   . Fibrocystic breast disease   . Asthma   . Depression   . Allergy   . Anxiety   . Cataract   . GERD (gastroesophageal reflux disease)   . Heart murmur   . Osteoporosis   . Constipation   . Hiatal hernia     Past Surgical History  Procedure Laterality Date  . Cholecystectomy    . Abdominal hysterectomy    . Eye surgery    . Cholecystectomy      Family History  Problem Relation Age of Onset  . Arthritis Mother   . Diabetes Mother   . Heart disease Mother   . Hypertension Mother   . Miscarriages / [place] Mother   . Alcohol abuse Father   . COPD Father   . Cancer Sister     pancriatic cancer  . Alcohol abuse Brother   . Early death Brother   . Heart disease Brother   . Hypertension Brother     Social History:  reports that she has never smoked. Her smokeless tobacco use includes Snuff. She reports that she does not drink alcohol or use illicit drugs.  Allergies:  Allergies  Allergen Reactions  . Aspirin Other (See Comments)    "My doctor told me to never take aspirin"  . Morphine And Related Other (See Comments)    Medications: I have reviewed the patient's current medications.  Results for orders placed or performed during the hospital encounter of 11/30/15 (from the past 48 hour(s))  Type and screen [place]     Status: None   Collection  Time: 11/30/15  2:14 PM  Result Value Ref Range   ABO/RH(D) O NEG    Antibody Screen NEG    Sample Expiration 12/03/2015   ABO/Rh     Status: None   Collection Time: 11/30/15  2:14 PM  Result Value Ref Range   ABO/RH(D) O NEG   Basic metabolic panel     Status: Abnormal   Collection Time: 11/30/15  2:18 PM  Result Value Ref Range   Sodium 141 135 - 145 mmol/L   Potassium 3.4 (L) 3.5 - 5.1 mmol/L   Chloride 108 101 - 111 mmol/L   CO2 25 22 - 32 mmol/L   Glucose, Bld 146 (H) 65 - 99 mg/dL   BUN 8 6 - 20 mg/dL   Creatinine, Ser 0.76 0.44 - 1.00 mg/dL   Calcium 9.1 8.9 - 10.3 mg/dL   GFR calc non Af Amer >60 >60 mL/min   GFR calc Af Amer >60 >60 mL/min    Comment: (NOTE) The eGFR has been calculated using the CKD EPI equation. This calculation has not been validated in all clinical situations. eGFR's persistently <60 mL/min signify possible Chronic Kidney Disease.    Anion gap 8 5 -  15  CBC WITH DIFFERENTIAL     Status: Abnormal   Collection Time: 11/30/15  2:18 PM  Result Value Ref Range   WBC 12.2 (H) 4.0 - 10.5 K/uL   RBC 3.94 3.87 - 5.11 MIL/uL   Hemoglobin 12.6 12.0 - 15.0 g/dL   HCT 36.7 36.0 - 46.0 %   MCV 93.1 78.0 - 100.0 fL   MCH 32.0 26.0 - 34.0 pg   MCHC 34.3 30.0 - 36.0 g/dL   RDW 12.8 11.5 - 15.5 %   Platelets 185 150 - 400 K/uL   Neutrophils Relative % 75 %   Neutro Abs 9.2 (H) 1.7 - 7.7 K/uL   Lymphocytes Relative 18 %   Lymphs Abs 2.2 0.7 - 4.0 K/uL   Monocytes Relative 6 %   Monocytes Absolute 0.7 0.1 - 1.0 K/uL   Eosinophils Relative 1 %   Eosinophils Absolute 0.2 0.0 - 0.7 K/uL   Basophils Relative 0 %   Basophils Absolute 0.0 0.0 - 0.1 K/uL  Protime-INR     Status: None   Collection Time: 11/30/15  2:18 PM  Result Value Ref Range   Prothrombin Time 14.7 11.6 - 15.2 seconds   INR 1.17 0.00 - 1.49    Dg Chest 1 View  11/30/2015  CLINICAL DATA:  Fall, landed on RIGHT side.  RIGHT-sided pain EXAM: CHEST 1 VIEW COMPARISON:  09/24/2015 FINDINGS:  Normal mediastinum and cardiac silhouette. Lungs are hyperinflated. No effusion, infiltrate pneumothorax. No evidence of fracture. Pulmonary is a skin fold over LEFT upper hemi thorax. IMPRESSION: No evidence of thoracic trauma Emphysematous change. Electronically Signed   By: Suzy Bouchard M.D.   On: 11/30/2015 14:22   Dg Lumbar Spine Complete  11/30/2015  CLINICAL DATA:  Fall today.  Landed on RIGHT side. EXAM: LUMBAR SPINE - COMPLETE 4+ VIEW COMPARISON:  None. FINDINGS: Normal alignment of lumbar vertebral bodies. No loss of vertebral body height or disc height. There is osteopenia noted. Multiple levels of endplate spurring. IMPRESSION: No acute findings lumbar spine. Electronically Signed   By: Suzy Bouchard M.D.   On: 11/30/2015 14:19   Dg Pelvis 1-2 Views  11/30/2015  CLINICAL DATA:  Fall with right hip pain EXAM: PELVIS - 1-2 VIEW COMPARISON:  None. FINDINGS: There is a comminuted intertrochanteric right proximal femur fracture with mild apex lateral angulation. No additional fracture in the pelvis. No diastasis or suspicious focal osseous lesion. No evidence of hip dislocation on this single frontal view. Mild degenerative changes in the visualized lower lumbar spine. IMPRESSION: Comminuted intertrochanteric right proximal femur fracture. Electronically Signed   By: Ilona Sorrel M.D.   On: 11/30/2015 14:17   Dg Femur, Min 2 Views Right  11/30/2015  CLINICAL DATA:  Fall.  Right hip pain. EXAM: RIGHT FEMUR 2 VIEWS COMPARISON:  None. FINDINGS: There is a comminuted intertrochanteric right proximal femur fracture with mild apex lateral angulation, mild impaction and mild 6 mm medial displacement of the dominant distal fracture fragment. No additional fracture. No dislocation at the right hip or right knee. No suspicious focal osseous lesion. IMPRESSION: Comminuted intertrochanteric right proximal femur fracture as described. Electronically Signed   By: Ilona Sorrel M.D.   On: 11/30/2015 14:19     Review of Systems  All other systems reviewed and are negative.  Blood pressure 134/65, pulse 86, temperature 97.8 F (36.6 C), temperature source Oral, resp. rate 15, height 5' 4"  (1.626 m), weight 68.947 kg (152 lb), SpO2 97 %. Physical Exam  Constitutional: She is oriented to person, place, and time. She appears well-developed and well-nourished.  HENT:  Head: Normocephalic and atraumatic.  Eyes: Pupils are equal, round, and reactive to light.  Neck: Normal range of motion.  Cardiovascular: Normal rate.   Respiratory: Effort normal and breath sounds normal.  GI: Soft. Bowel sounds are normal.  Musculoskeletal:       Right hip: She exhibits decreased range of motion, decreased strength, tenderness, bony tenderness and deformity.  Neurological: She is alert and oriented to person, place, and time.  Skin: Skin is warm and dry.  Psychiatric: She has a normal mood and affect.    Assessment/Plan: Displaced right hip intertrochanteric femur fracture post-mechanical fall 1)  I have spoken to her and her family in length about her injury and the recommendation for surgery.  She understands fully this recommendation and a discussion of the risks and benefits was had.  Informed consent is obtained.  She has been seen and cleared by Internal Medicine.  Mcarthur Rossetti 11/30/2015, 4:40 PM

## 2015-11-30 NOTE — Progress Notes (Signed)
ANTICOAGULATION CONSULT NOTE - Initial Consult  Pharmacy Consult for Warfarin Indication: VTE prophylaxis  Allergies  Allergen Reactions  . Aspirin Other (See Comments)    "My doctor told me to never take aspirin"  . Morphine And Related Other (See Comments)   Patient Measurements: Height: 5\' 4"  (162.6 cm) Weight: 152 lb (68.947 kg) IBW/kg (Calculated) : 54.7  Vital Signs: Temp: 97.9 F (36.6 C) (05/19 2056) Temp Source: Oral (05/19 1335) BP: 114/72 mmHg (05/19 2056) Pulse Rate: 98 (05/19 2056)  Labs:  Recent Labs  11/30/15 1418  HGB 12.6  HCT 36.7  PLT 185  LABPROT 14.7  INR 1.17  CREATININE 0.76   Estimated Creatinine Clearance: 51.7 mL/min (by C-G formula based on Cr of 0.76).  Medical History: Past Medical History  Diagnosis Date  . Arthritis   . Coronary artery disease   . Hiatal hernia   . Fibrocystic breast disease   . Asthma   . Depression   . Allergy   . Anxiety   . Cataract   . GERD (gastroesophageal reflux disease)   . Heart murmur   . Osteoporosis   . Constipation   . Hiatal hernia    Medications:  Scheduled:  . [START ON 12/01/2015]  ceFAZolin (ANCEF) IV  2 g Intravenous Q6H  . coumadin book   Does not apply Once  . diphenoxylate-atropine  5 mL Oral Once  . docusate sodium  100 mg Oral BID  . [START ON 12/01/2015] DULoxetine  20 mg Oral Daily  . fentaNYL      . [START ON 12/01/2015] ketotifen  1 drop Both Eyes BID  . [START ON 12/01/2015] pantoprazole  40 mg Oral Daily  . potassium chloride  40 mEq Oral Once  . promethazine      . warfarin  4 mg Oral Once  . warfarin   Does not apply Once  . [START ON 12/01/2015] Warfarin - Pharmacist Dosing Inpatient   Does not apply q1800   Assessment: [age] yoF s/p Hip fx repair, Warfarin for VTE prophylaxis. Baseline INR wnl, no anti-coagulants PTA.  Goal of Therapy:  INR 2-3 Monitor platelets by anticoagulation protocol: Yes   Plan:   Warfarin 4mg  tonight  Daily CBC, Protime/INR  Coumadin  booklet, video, education  Otho BellowsGreen, Norie Latendresse L PharmD Pager [phone] 11/30/2015, 9:54 PM

## 2015-11-30 NOTE — Progress Notes (Signed)
CM reviewed in details medicare guidelines, Choices of home health [facility](HH) (length of stay in home, [facility], coverage, primary caregiver, up to 24 hrs before services may be started) and choices of Private duty nursing (PDN-coverage, length of stay in the home types of staff available). CM reviewed availability of HH SW to assist pcp to get pt to snf (if desired disposition) from the community level. CM provided pt/family with a list of [place] home health agencies and PDN.   Discussed pt to be further evaluated by unit therapists (PT/OT) for recommendation of level of care and share this with attending MD and unit CM Pending pt choice  Pt confirms no long term medicaid policy "only medicare" coverage

## 2015-11-30 NOTE — ED Notes (Addendum)
Patient aware that urine sample is needed. Patient states she cannot void at this time.

## 2015-11-30 NOTE — H&P (Addendum)
History and Physical    ROBERTTA HALFHILL DGL:[id number] DOB: Nov 17, 1930 DOA: 11/30/2015  PCP: Kevin Fenton, MD     Patient coming from: home  Chief Complaint: fell and broke her hip  HPI: Mikayla Wilson is a 82 y.o. female with medical history significant of RBBB, hiatal hernia, anxiety, hyperlipidemia, GERD and asthma who presents with a right intertronchanteric femur fracture while doing yard work and falling. She has no complaints of chest pain or dyspnea on exertion. She has been having 2-3 BMs a day (not all are loose) for the past 2-3 weeks. She states it started after Prednisone was started for respiratory issues.   ED Course: has been seen by Ortho and will go to OR in an hour- received Fentanyl 25 mg x 2  Review of Systems:  All other systems reviewed and apart from HPI, are negative.  Past Medical History  Diagnosis Date  . Arthritis   . Hiatal hernia   . Fibrocystic breast disease   . Asthma   . Depression   . Anxiety   . Cataract   . GERD (gastroesophageal reflux disease)   . Heart murmur   . Osteoporosis   . Constipation     Past Surgical History  Procedure Laterality Date  . Cholecystectomy    . Abdominal hysterectomy    . Eye surgery    . Cholecystectomy     Social history:  reports that she has never smoked. Her smokeless tobacco use includes Snuff. She reports that she does not drink alcohol or use illicit drugs.  Allergies  Allergen Reactions  . Aspirin Other (See Comments)    "My doctor told me to never take aspirin"  . Morphine And Related Other (See Comments)    Family History  Problem Relation Age of Onset  . Arthritis Mother   . Diabetes Mother   . Heart disease Mother   . Hypertension Mother   . Miscarriages / [place] Mother   . Alcohol abuse Father   . COPD Father   . Cancer Sister     pancriatic cancer  . Alcohol abuse Brother   . Early death Brother   . Heart disease Brother   . Hypertension Brother      Prior to  Admission medications   Medication Sig Start Date End Date Taking? Authorizing Provider  acetaminophen (TYLENOL) 500 MG tablet Take 1,000 mg by mouth daily as needed for mild pain or moderate pain.   Yes Historical Provider, MD  Cholecalciferol (VITAMIN D PO) Take 1 tablet by mouth daily.   Yes Historical Provider, MD  DULoxetine (CYMBALTA) 20 MG capsule Take 1 capsule (20 mg total) by mouth daily. 09/24/15  Yes Elenora Gamma, MD  GuaiFENesin (COUGH SYRUP PO) Take 15 mLs by mouth daily as needed (cough).   Yes Historical Provider, MD  ketotifen (ZADITOR) 0.025 % ophthalmic solution Place 1 drop into both eyes 6 (six) times daily.   Yes Historical Provider, MD  LORATADINE PO Take 1 tablet by mouth daily as needed (allergies, runny nose).   Yes Historical Provider, MD  LORazepam (ATIVAN) 1 MG tablet Take 1 tablet (1 mg total) by mouth every 12 (twelve) hours as needed for anxiety. 09/24/15  Yes Elenora Gamma, MD  Naphazoline-Pheniramine (VISINE-A OP) Apply 1-2 drops to eye 2 (two) times daily as needed (allergies).   Yes Historical Provider, MD  Nutritional Supplements (ENSURE NUTRITION SHAKE PO) Take 1-2 Bottles by mouth daily. Reported on 11/30/2015   Yes Historical  Provider, MD  omeprazole (PRILOSEC) 20 MG capsule Take 1 capsule (20 mg total) by mouth daily. 09/24/15  Yes Elenora Gamma, MD  rosuvastatin (CRESTOR) 5 MG tablet Take 5 mg by mouth daily.   Yes Historical Provider, MD  SALINE NASAL MIST NA Place 1 spray into the nose 2 (two) times daily.   Yes Historical Provider, MD  XOPENEX HFA 45 MCG/ACT inhaler Inhale 2 puffs into the lungs every 8 (eight) hours as needed for wheezing or shortness of breath.  08/16/15  Yes Historical Provider, MD  predniSONE (DELTASONE) 10 MG tablet Take 4 pills a day for 3 days, then 3 pills a day for 3 days, then 2 a day for 3 days, then 1 a day for 3 days Patient not taking: Reported on 11/30/2015 09/24/15   Elenora Gamma, MD    Physical Exam: Filed  Vitals:   11/30/15 1413 11/30/15 1416 11/30/15 1443 11/30/15 1530  BP: 126/68  131/70 134/65  Pulse: 85 79 84 86  Temp:      TempSrc:      Resp: 22 14 21 15   Height:      Weight:      SpO2: 97% 99% 96% 97%      Constitutional: NAD, calm, comfortable Eyes: PERTLA, lids and conjunctivae normal ENMT: Mucous membranes are moist. Posterior pharynx clear of any exudate or lesions. Normal dentition.  Neck: normal, supple, no masses, no thyromegaly Respiratory: clear to auscultation bilaterally, no wheezing, no crackles. Normal respiratory effort. No accessory muscle use.  Cardiovascular: S1 & S2 heard, regular rate and rhythm, no murmurs / rubs / gallops. No extremity edema. 2+ pedal pulses. No carotid bruits.  Abdomen: No distension, no tenderness, no masses palpated. No hepatosplenomegaly. Bowel sounds normal.  Musculoskeletal: no clubbing / cyanosis. No joint deformity upper and lower extremities. Good ROM, no contractures. Normal muscle tone.  Skin: no rashes, lesions, ulcers. No induration Neurologic: CN 2-12 grossly intact. Sensation intact, DTR normal. Strength 5/5 in all 4 limbs.  Psychiatric: Normal judgment and insight. Alert and oriented x 3. Normal mood.     Labs on Admission: I have personally reviewed following labs and imaging studies  CBC:  Recent Labs Lab 11/30/15 1418  WBC 12.2*  NEUTROABS 9.2*  HGB 12.6  HCT 36.7  MCV 93.1  PLT 185   Basic Metabolic Panel:  Recent Labs Lab 11/30/15 1418  NA 141  K 3.4*  CL 108  CO2 25  GLUCOSE 146*  BUN 8  CREATININE 0.76  CALCIUM 9.1   GFR: Estimated Creatinine Clearance: 51.7 mL/min (by C-G formula based on Cr of 0.76). Liver Function Tests: No results for input(s): AST, ALT, ALKPHOS, BILITOT, PROT, ALBUMIN in the last 168 hours. No results for input(s): LIPASE, AMYLASE in the last 168 hours. No results for input(s): AMMONIA in the last 168 hours. Coagulation Profile:  Recent Labs Lab 11/30/15 1418  INR  1.17   Cardiac Enzymes: No results for input(s): CKTOTAL, CKMB, CKMBINDEX, TROPONINI in the last 168 hours. BNP (last 3 results) No results for input(s): PROBNP in the last 8760 hours. HbA1C: No results for input(s): HGBA1C in the last 72 hours. CBG: No results for input(s): GLUCAP in the last 168 hours. Lipid Profile: No results for input(s): CHOL, HDL, LDLCALC, TRIG, CHOLHDL, LDLDIRECT in the last 72 hours. Thyroid Function Tests: No results for input(s): TSH, T4TOTAL, FREET4, T3FREE, THYROIDAB in the last 72 hours. Anemia Panel: No results for input(s): VITAMINB12, FOLATE, FERRITIN, TIBC, IRON,  RETICCTPCT in the last 72 hours. Urine analysis:    Component Value Date/Time   COLORURINE YELLOW 01/25/2015 2330   APPEARANCEUR CLEAR 01/25/2015 2330   LABSPEC 1.015 01/25/2015 2330   PHURINE 6.0 01/25/2015 2330   GLUCOSEU NEGATIVE 01/25/2015 2330   HGBUR SMALL* 01/25/2015 2330   BILIRUBINUR NEGATIVE 01/25/2015 2330   KETONESUR NEGATIVE 01/25/2015 2330   PROTEINUR NEGATIVE 01/25/2015 2330   UROBILINOGEN 0.2 01/25/2015 2330   NITRITE NEGATIVE 01/25/2015 2330   LEUKOCYTESUR TRACE* 01/25/2015 2330   Sepsis Labs: @LABRCNTIP (procalcitonin:4,lacticidven:4) )No results found for this or any previous visit (from the past 240 hour(s)).   Radiological Exams on Admission: Dg Chest 1 View  11/30/2015  CLINICAL DATA:  Fall, landed on RIGHT side.  RIGHT-sided pain EXAM: CHEST 1 VIEW COMPARISON:  09/24/2015 FINDINGS: Normal mediastinum and cardiac silhouette. Lungs are hyperinflated. No effusion, infiltrate pneumothorax. No evidence of fracture. Pulmonary is a skin fold over LEFT upper hemi thorax. IMPRESSION: No evidence of thoracic trauma Emphysematous change. Electronically Signed   By: Genevive BiStewart  Edmunds M.D.   On: 11/30/2015 14:22   Dg Lumbar Spine Complete  11/30/2015  CLINICAL DATA:  Fall today.  Landed on RIGHT side. EXAM: LUMBAR SPINE - COMPLETE 4+ VIEW COMPARISON:  None. FINDINGS: Normal  alignment of lumbar vertebral bodies. No loss of vertebral body height or disc height. There is osteopenia noted. Multiple levels of endplate spurring. IMPRESSION: No acute findings lumbar spine. Electronically Signed   By: Genevive BiStewart  Edmunds M.D.   On: 11/30/2015 14:19   Dg Pelvis 1-2 Views  11/30/2015  CLINICAL DATA:  Fall with right hip pain EXAM: PELVIS - 1-2 VIEW COMPARISON:  None. FINDINGS: There is a comminuted intertrochanteric right proximal femur fracture with mild apex lateral angulation. No additional fracture in the pelvis. No diastasis or suspicious focal osseous lesion. No evidence of hip dislocation on this single frontal view. Mild degenerative changes in the visualized lower lumbar spine. IMPRESSION: Comminuted intertrochanteric right proximal femur fracture. Electronically Signed   By: Delbert PhenixJason A Poff M.D.   On: 11/30/2015 14:17   Dg Femur, Min 2 Views Right  11/30/2015  CLINICAL DATA:  Fall.  Right hip pain. EXAM: RIGHT FEMUR 2 VIEWS COMPARISON:  None. FINDINGS: There is a comminuted intertrochanteric right proximal femur fracture with mild apex lateral angulation, mild impaction and mild 6 mm medial displacement of the dominant distal fracture fragment. No additional fracture. No dislocation at the right hip or right knee. No suspicious focal osseous lesion. IMPRESSION: Comminuted intertrochanteric right proximal femur fracture as described. Electronically Signed   By: Delbert PhenixJason A Poff M.D.   On: 11/30/2015 14:19    EKG: Independently reviewed. RBBB  Assessment/Plan Principal Problem:  Right hip fracture requiring operative repair  - per ortho- no medical issues to prohibit surgery  Active Problems:   Anxiety - per PCPs notes she takes Ativan 1mg  3-4 x day- med rec states BID- will order it BID - Cymbalta    HLD (hyperlipidemia) - Rosuvastatin    GERD (gastroesophageal reflux disease) - Prilosec- states this did not cause her diarrhea and she needs it    Asthma -  Xopenex  PRN  RBBB - per history and EKG  DVT prophylaxis: per ortho  Code Status: Full code  Family Communication: son and daughter in law  Disposition Plan: to OR later today  Consults called: ortho- Dr Magnus Ivanblackman  Admission status: admit    [facility] Center LLCRIZWAN,Evanny Ellerbe MD [facility] Pager: [email or web address] Password [email or web address] 7PM-7AM, please contact night-coverage   11/30/2015,  4:54 PM

## 2015-11-30 NOTE — Anesthesia Procedure Notes (Signed)
Procedure Name: Intubation Date/Time: 11/30/2015 5:43 PM Performed by: Early OsmondEARGLE, Maciej Schweitzer E Pre-anesthesia Checklist: Patient identified, Emergency Drugs available, Suction available and Patient being monitored Patient Re-evaluated:Patient Re-evaluated prior to inductionOxygen Delivery Method: Circle System Utilized Preoxygenation: Pre-oxygenation with 100% oxygen Intubation Type: IV induction, Rapid sequence and Cricoid Pressure applied Ventilation: Mask ventilation without difficulty Laryngoscope Size: Miller and 2 Grade View: Grade I Tube type: Oral Tube size: 7.0 mm Number of attempts: 1 Airway Equipment and Method: Stylet Placement Confirmation: ETT inserted through vocal cords under direct vision,  positive ETCO2 and breath sounds checked- equal and bilateral Secured at: 21 cm Tube secured with: Tape Dental Injury: Teeth and Oropharynx as per pre-operative assessment

## 2015-11-30 NOTE — ED Provider Notes (Signed)
CSN: [id number]     Arrival date & time 11/30/15  1319 History   First MD Initiated Contact with Patient 11/30/15 1326     Chief Complaint  Patient presents with  . Fall  . Hip Pain   PT FELL IN HER YARD ON HER RIGHT SIDE.  SHE C/O RIGHT HIP AND LOW BACK PAIN.  THE PT DID NOT WANT ANY PAIN MEDS FROM EMS, BUT SHE IS OK WITH PAIN MEDS FROM US AS LONG AS IT ISN'T MORPHINE.  THE PT DENIES HITTING HER HEAD.  SHE TRIPPED ON SOME BRUSH.  NO LOC.  PT DID ALSO SUSTAIN A RIGHT ELBOW LAC.  (Consider location/radiation/quality/duration/timing/severity/associated sxs/prior Treatment) Patient is a 82 y.o. female presenting with fall and hip pain. The history is provided by the patient.  Fall This is a new problem. The current episode started less than 1 hour ago. The problem occurs constantly. The problem has not changed since onset.Nothing relieves the symptoms.  Hip Pain    Past Medical History  Diagnosis Date  . Arthritis   . Coronary artery disease   . Hiatal hernia   . Fibrocystic breast disease   . Asthma   . Depression   . Allergy   . Anxiety   . Cataract   . GERD (gastroesophageal reflux disease)   . Heart murmur   . Osteoporosis   . Constipation   . Hiatal hernia    Past Surgical History  Procedure Laterality Date  . Cholecystectomy    . Abdominal hysterectomy    . Eye surgery    . Cholecystectomy     Family History  Problem Relation Age of Onset  . Arthritis Mother   . Diabetes Mother   . Heart disease Mother   . Hypertension Mother   . Miscarriages / [place] Mother   . Alcohol abuse Father   . COPD Father   . Cancer Sister     pancriatic cancer  . Alcohol abuse Brother   . Early death Brother   . Heart disease Brother   . Hypertension Brother    Social History  Substance Use Topics  . Smoking status: Never Smoker   . Smokeless tobacco: Current User    Types: Snuff  . Alcohol Use: No   OB History    No data available     Review of Systems   Musculoskeletal: Positive for back pain.       RIGHT HIP  All other systems reviewed and are negative.     Allergies  Aspirin  Home Medications   Prior to Admission medications   Medication Sig Start Date End Date Taking? Authorizing Provider  acetaminophen (TYLENOL) 500 MG tablet Take 1,000 mg by mouth daily as needed for mild pain or moderate pain.   Yes Historical Provider, MD  Cholecalciferol (VITAMIN D PO) Take 1 tablet by mouth daily.   Yes Historical Provider, MD  GuaiFENesin (COUGH SYRUP PO) Take 15 mLs by mouth daily as needed (cough).   Yes Historical Provider, MD  LORATADINE PO Take 1 tablet by mouth daily as needed (allergies, runny nose).   Yes Historical Provider, MD  LORazepam (ATIVAN) 1 MG tablet Take 1 tablet (1 mg total) by mouth every 12 (twelve) hours as needed for anxiety. 09/24/15  Yes Elenora GammaSamuel L Bradshaw, MD  Naphazoline-Pheniramine (VISINE-A OP) Apply 1-2 drops to eye 2 (two) times daily as needed (allergies).   Yes Historical Provider, MD  Nutritional Supplements (ENSURE NUTRITION SHAKE PO) Take 1-2 Bottles by  mouth daily. Reported on 11/30/2015   Yes Historical Provider, MD  rosuvastatin (CRESTOR) 5 MG tablet Take 5 mg by mouth daily.   Yes Historical Provider, MD  XOPENEX HFA 45 MCG/ACT inhaler Inhale 2 puffs into the lungs every 8 (eight) hours as needed for wheezing or shortness of breath.  08/16/15  Yes Historical Provider, MD  DULoxetine (CYMBALTA) 20 MG capsule Take 1 capsule (20 mg total) by mouth daily. Patient not taking: Reported on 11/30/2015 09/24/15   Elenora Gamma, MD  omeprazole (PRILOSEC) 20 MG capsule Take 1 capsule (20 mg total) by mouth daily. Patient not taking: Reported on 11/30/2015 09/24/15   Elenora Gamma, MD  predniSONE (DELTASONE) 10 MG tablet Take 4 pills a day for 3 days, then 3 pills a day for 3 days, then 2 a day for 3 days, then 1 a day for 3 days Patient not taking: Reported on 11/30/2015 09/24/15   Elenora Gamma, MD   BP  134/65 mmHg  Pulse 86  Temp(Src) 97.8 F (36.6 C) (Oral)  Resp 15  Ht 5\' 4"  (1.626 m)  Wt 152 lb (68.947 kg)  BMI 26.08 kg/m2  SpO2 97% Physical Exam  Constitutional: She is oriented to person, place, and time. She appears well-developed and well-nourished.  HENT:  Head: Normocephalic and atraumatic.  Right Ear: External ear normal.  Left Ear: External ear normal.  Mouth/Throat: Oropharynx is clear and moist.  Eyes: Conjunctivae and EOM are normal. Pupils are equal, round, and reactive to light.  Neck: Normal range of motion. Neck supple.  Cardiovascular: Normal rate, regular rhythm, normal heart sounds and intact distal pulses.   Pulmonary/Chest: Effort normal and breath sounds normal.  Abdominal: Soft. Bowel sounds are normal.  Musculoskeletal: She exhibits tenderness.       Right hip: She exhibits tenderness and deformity.  Neurological: She is alert and oriented to person, place, and time.  Skin: Skin is warm and dry.  Psychiatric: She has a normal mood and affect. Her behavior is normal. Judgment and thought content normal.  Nursing note and vitals reviewed.   ED Course  Procedures (including critical care time) Labs Review Labs Reviewed  BASIC METABOLIC PANEL - Abnormal; Notable for the following:    Potassium 3.4 (*)    Glucose, Bld 146 (*)    All other components within normal limits  CBC WITH DIFFERENTIAL/PLATELET - Abnormal; Notable for the following:    WBC 12.2 (*)    Neutro Abs 9.2 (*)    All other components within normal limits  PROTIME-INR  URINALYSIS, ROUTINE W REFLEX MICROSCOPIC (NOT AT [facility])  TYPE AND SCREEN  ABO/RH    Imaging Review Dg Chest 1 View  11/30/2015  CLINICAL DATA:  Fall, landed on RIGHT side.  RIGHT-sided pain EXAM: CHEST 1 VIEW COMPARISON:  09/24/2015 FINDINGS: Normal mediastinum and cardiac silhouette. Lungs are hyperinflated. No effusion, infiltrate pneumothorax. No evidence of fracture. Pulmonary is a skin fold over LEFT upper hemi  thorax. IMPRESSION: No evidence of thoracic trauma Emphysematous change. Electronically Signed   By: Genevive Bi M.D.   On: 11/30/2015 14:22   Dg Lumbar Spine Complete  11/30/2015  CLINICAL DATA:  Fall today.  Landed on RIGHT side. EXAM: LUMBAR SPINE - COMPLETE 4+ VIEW COMPARISON:  None. FINDINGS: Normal alignment of lumbar vertebral bodies. No loss of vertebral body height or disc height. There is osteopenia noted. Multiple levels of endplate spurring. IMPRESSION: No acute findings lumbar spine. Electronically Signed   By: Roseanne Reno  Amil Amen M.D.   On: 11/30/2015 14:19   Dg Pelvis 1-2 Views  11/30/2015  CLINICAL DATA:  Fall with right hip pain EXAM: PELVIS - 1-2 VIEW COMPARISON:  None. FINDINGS: There is a comminuted intertrochanteric right proximal femur fracture with mild apex lateral angulation. No additional fracture in the pelvis. No diastasis or suspicious focal osseous lesion. No evidence of hip dislocation on this single frontal view. Mild degenerative changes in the visualized lower lumbar spine. IMPRESSION: Comminuted intertrochanteric right proximal femur fracture. Electronically Signed   By: Delbert Phenix M.D.   On: 11/30/2015 14:17   Dg Femur, Min 2 Views Right  11/30/2015  CLINICAL DATA:  Fall.  Right hip pain. EXAM: RIGHT FEMUR 2 VIEWS COMPARISON:  None. FINDINGS: There is a comminuted intertrochanteric right proximal femur fracture with mild apex lateral angulation, mild impaction and mild 6 mm medial displacement of the dominant distal fracture fragment. No additional fracture. No dislocation at the right hip or right knee. No suspicious focal osseous lesion. IMPRESSION: Comminuted intertrochanteric right proximal femur fracture as described. Electronically Signed   By: Delbert Phenix M.D.   On: 11/30/2015 14:19   I have personally reviewed and evaluated these images and lab results as part of my medical decision-making.   EKG Interpretation   Date/Time:  Friday Nov 30 2015 14:11:42  EDT Ventricular Rate:  79 PR Interval:  176 QRS Duration: 137 QT Interval:  425 QTC Calculation: 487 R Axis:   -5 Text Interpretation:  Sinus rhythm Right bundle branch block Confirmed by  Perfecto Purdy MD, Kwabena Strutz ([id number]) on 11/30/2015 2:18:37 PM      MDM  PT D/W DR. BLACKMAN (ORTHO) WHO WILL CONSULT ON PT.  PT D/W DR. Karlyne Greenspan WHO WILL ADMIT HER. Final diagnoses:  Closed comminuted intertrochanteric fracture of right femur (HCC)  Accidental fall       Jacalyn Lefevre, MD 11/30/15 2147

## 2015-12-01 DIAGNOSIS — S72001D Fracture of unspecified part of neck of right femur, subsequent encounter for closed fracture with routine healing: Secondary | ICD-10-CM

## 2015-12-01 LAB — CBC
HEMATOCRIT: 27.2 % — AB (ref 36.0–46.0)
HEMOGLOBIN: 9.3 g/dL — AB (ref 12.0–15.0)
MCH: 32 pg (ref 26.0–34.0)
MCHC: 34.2 g/dL (ref 30.0–36.0)
MCV: 93.5 fL (ref 78.0–100.0)
Platelets: 164 10*3/uL (ref 150–400)
RBC: 2.91 MIL/uL — ABNORMAL LOW (ref 3.87–5.11)
RDW: 12.9 % (ref 11.5–15.5)
WBC: 12.5 10*3/uL — ABNORMAL HIGH (ref 4.0–10.5)

## 2015-12-01 LAB — BASIC METABOLIC PANEL
Anion gap: 6 (ref 5–15)
BUN: 6 mg/dL (ref 6–20)
CHLORIDE: 106 mmol/L (ref 101–111)
CO2: 26 mmol/L (ref 22–32)
CREATININE: 0.79 mg/dL (ref 0.44–1.00)
Calcium: 8 mg/dL — ABNORMAL LOW (ref 8.9–10.3)
GFR calc non Af Amer: >60 mL/min (ref >60)
Glucose, Bld: 193 mg/dL — ABNORMAL HIGH (ref 65–99)
Potassium: 4.1 mmol/L (ref 3.5–5.1)
Sodium: 138 mmol/L (ref 135–145)

## 2015-12-01 LAB — PROTIME-INR
INR: 1.27 (ref 0.00–1.49)
Prothrombin Time: 15.6 seconds — ABNORMAL HIGH (ref 11.6–15.2)

## 2015-12-01 MED ORDER — OXYCODONE-ACETAMINOPHEN 5-325 MG PO TABS: 1.0000 | ORAL_TABLET | ORAL | Status: DC | PRN

## 2015-12-01 MED ORDER — METHOCARBAMOL 500 MG PO TABS: 500.0000 mg | ORAL_TABLET | Freq: Four times a day (QID) | ORAL | Status: DC | PRN

## 2015-12-01 MED ORDER — WARFARIN SODIUM 4 MG PO TABS
4.0000 mg | ORAL_TABLET | Freq: Once | ORAL | Status: AC
  Administered 2015-12-01: 4 mg via ORAL
  Filled 2015-12-01: qty 1

## 2015-12-01 NOTE — Progress Notes (Signed)
ANTICOAGULATION CONSULT NOTE - Follow-Up  Pharmacy Consult for Warfarin Indication: VTE prophylaxis  Allergies  Allergen Reactions  . Aspirin Other (See Comments)    "My doctor told me to never take aspirin"  . Morphine And Related Other (See Comments)   Patient Measurements: Height: 5\' 4"  (162.6 cm) Weight: 152 lb (68.947 kg) IBW/kg (Calculated) : 54.7  Vital Signs: Temp: 98.2 F (36.8 C) (05/20 0435) Temp Source: Oral (05/20 0435) BP: 104/54 mmHg (05/20 0435) Pulse Rate: 82 (05/20 0435)  Labs:  Recent Labs  11/30/15 1418 12/01/15 0423  HGB 12.6 9.3*  HCT 36.7 27.2*  PLT 185 164  LABPROT 14.7 15.6*  INR 1.17 1.27  CREATININE 0.76 0.79   Estimated Creatinine Clearance: 51.7 mL/min (by C-G formula based on Cr of 0.79).  Medical History: Past Medical History  Diagnosis Date  . Arthritis   . Coronary artery disease   . Hiatal hernia   . Fibrocystic breast disease   . Asthma   . Depression   . Allergy   . Anxiety   . Cataract   . GERD (gastroesophageal reflux disease)   . Heart murmur   . Osteoporosis   . Constipation   . Hiatal hernia    Medications:  Scheduled:  . diphenoxylate-atropine  5 mL Oral Once  . docusate sodium  100 mg Oral BID  . DULoxetine  20 mg Oral Daily  . ketotifen  1 drop Both Eyes BID  . pantoprazole  40 mg Oral Daily  . potassium chloride  40 mEq Oral Once  . Warfarin - Pharmacist Dosing Inpatient   Does not apply q1800   Assessment: [age] yoF s/p Hip fx repair, Warfarin for VTE prophylaxis. Baseline INR wnl, no anti-coagulants PTA.  Today, 12/01/2015   INR 1.27, subtherapeutic  Hgb decreased from 12.6 to 9.3, will monitor  Patient education completed on 5/20    Goal of Therapy:  INR 2-3 Monitor platelets by anticoagulation protocol: Yes   Plan:   Warfarin 4mg  tonight  Daily CBC, Protime/INR  Coumadin booklet, video, education  Adalberto Coleikola Valeta Paz, PharmD, BCPS Pager [phone] 12/01/2015 1:53 PM

## 2015-12-01 NOTE — Evaluation (Signed)
Physical Therapy Evaluation Patient Details Name: Mikayla LampJosephine M Espe MRN: [id number] DOB: 1931-02-25 Today's Date: 12/01/2015   History of Present Illness  82 yo female adm 11/30/15 after fall resulting in righ hip fx, s/p ORIF right hip per Dr. Magnus IvanBlackman; PMHx:  RBBB, hiatal hernia, anxiety, depression, hyperlipidemia, GERD and asthma, +tobacco (dips snuff)   Clinical Impression  Pt admitted with above diagnosis. Pt currently with functional limitations due to the deficits listed below (see PT Problem List).   Pt will benefit from skilled PT to increase their independence and safety with mobility to allow discharge to the venue listed below.   Pt is safety/fall risk--recommend SNF however pt is adamantly refusing at time of eval; pt dtr lives with her (is not present at time of PT/OT evals) --question dtr's ability to care for pt as pt states her dtr falls frequently also;  Recommend SNF, if pt should D/C home she will need 24 hour assist and HHPT     Follow Up Recommendations SNF;Supervision/Assistance - 24 hour    Equipment Recommendations  None recommended by PT    Recommendations for Other Services       Precautions / Restrictions Precautions Precautions: Fall Restrictions Weight Bearing Restrictions: No Other Position/Activity Restrictions: WBAT RLE      Mobility  Bed Mobility Overal bed mobility: Needs Assistance Bed Mobility: Supine to Sit     Supine to sit: Mod assist;+2 for safety/equipment     General bed mobility comments: multi-modal cues for technique and self assist; requires assist with trunk, LEs, bed pad used to scoot to EOB  Transfers Overall transfer level: Needs assistance Equipment used: Rolling walker (2 wheeled) Transfers: Sit to/from Stand Sit to Stand: Min assist;+2 safety/equipment;+2 physical assistance         General transfer comment: cues for hand placement and overall safety  Ambulation/Gait Ambulation/Gait assistance: Min assist;+2  physical assistance;+2 safety/equipment Ambulation Distance (Feet): 10 Feet Assistive device: Rolling walker (2 wheeled) Gait Pattern/deviations: Step-to pattern;Antalgic;Trunk flexed     General Gait Details: multi-modal cues for sequence, step length, use of UEs, RW position  Stairs            Wheelchair Mobility    Modified Rankin (Stroke Patients Only)       Balance                                             Pertinent Vitals/Pain Pain Assessment: No/denies pain    Home Living Family/patient expects to be discharged to:: Unsure Living Arrangements: Children Available Help at Discharge: Family (per pt report, dtr not present at time of eval ) Type of Home: House       Home Layout: One level;Laundry or work area in [employer]ement Home Equipment: Environmental consultantWalker - 2 wheels;Gilmer MorCane - single point Additional Comments: Patient lives on main floor; daughter lives in basement. Has 2 canes, walker. Walk-in shower with seat and grab bars. Daughter supervises pt while showering. Has had some recent falls. Does own meal prep, cleans. Does not drive. pt reports her dtr falls as well    Prior Function Level of Independence: Independent         Comments: occasional use of a cane     Hand Dominance   Dominant Hand: Right    Extremity/Trunk Assessment   Upper Extremity Assessment: Defer to OT evaluation  Lower Extremity Assessment: RLE deficits/detail;Generalized weakness RLE Deficits / Details: grossly 2+ to 3/5       Communication   Communication: No difficulties  Cognition Arousal/Alertness: Awake/alert Behavior During Therapy: Anxious Overall Cognitive Status: Within Functional Limits for tasks assessed Area of Impairment: Attention;Following commands;Problem solving;Safety/judgement   Current Attention Level: Sustained   Following Commands: Follows one step commands consistently Safety/Judgement: Decreased awareness of safety;Decreased  awareness of deficits   Problem Solving: Requires verbal cues;Requires tactile cues General Comments: pt with tangential thoughts, requires frequent redirection to current situation/questions/tasks    General Comments      Exercises        Assessment/Plan    PT Assessment Patient needs continued PT services  PT Diagnosis Difficulty walking   PT Problem List Decreased strength;Decreased activity tolerance;Decreased balance;Decreased mobility;Decreased safety awareness;Decreased knowledge of use of DME  PT Treatment Interventions DME instruction;Gait training;Functional mobility training;Therapeutic activities;Therapeutic exercise;Patient/family education   PT Goals (Current goals can be found in the Care Plan section) Acute Rehab PT Goals Patient Stated Goal:  to go home PT Goal Formulation: With patient Time For Goal Achievement: 12/08/15    Frequency Min 5X/week   Barriers to discharge        Co-evaluation PT/OT/SLP Co-Evaluation/Treatment: Yes Reason for Co-Treatment: For patient/therapist safety PT goals addressed during session: Mobility/safety with mobility         End of Session Equipment Utilized During Treatment: Gait belt Activity Tolerance: Patient tolerated treatment well;Patient limited by fatigue Patient left: in chair;with call bell/phone within reach;with chair alarm set Nurse Communication: Mobility status         Time: 4098-1191 PT Time Calculation (min) (ACUTE ONLY): 26 min   Charges:   PT Evaluation $PT Eval Moderate Complexity: 1 Procedure     PT G Codes:        Imre Vecchione 2015/12/16, 12:16 PM

## 2015-12-01 NOTE — Progress Notes (Signed)
Pt recommended for Snf 24 hour supervision. Pt refusing snf. She shares that she lives with her duaghter and son in law who are also disabled and home 24 hours. CSW unable to reach daughter at # provided. CSW and RN Cm to f/u regarding home safety dc with HH or snf placement. CSW referred to RN CM.   Olga CoasterKristen Winda Summerall, LCSW  Clinical Social Work   [phone].

## 2015-12-01 NOTE — Progress Notes (Addendum)
Occupational Therapy Evaluation Patient Details Name: Mikayla Wilson MRN: [id number] DOB: 1931-06-25 Today's Date: 12/01/2015    History of Present Illness 82 yo female adm 11/30/15 after fall resulting in righ hip fx, s/p ORIF right hip per Dr. Magnus IvanBlackman; PMHx:  RBBB, hiatal hernia, anxiety, depression, hyperlipidemia, GERD and asthma, +tobacco (dips snuff)    Clinical Impression   Patient presents to OT with decreased ADL independence and safety. She will benefit from skilled OT to maximize function and to facilitate a safe discharge. OT will follow.  Of note, pt reports she lives with daughter and daughter's boyfriend who are both on disability. Question daughter's ability to care for patient as patient reports that her daughter also falls frequently. Patient also reported that she herself has been falling multiple times at home.   Patient appears to have decreased insight into her situation and current deficits. Recommend SNF. If patient refuses, she will need reliable 24/7 assistance and HHOT.    Follow Up Recommendations  SNF;Supervision/Assistance - 24 hour    Equipment Recommendations  Other (comment) (tbd next venue)    Recommendations for Other Services       Precautions / Restrictions Precautions Precautions: Fall Precaution Comments: reports multiple recent falls at home lately Restrictions Weight Bearing Restrictions: No Other Position/Activity Restrictions: WBAT RLE      Mobility Bed Mobility Overal bed mobility: Needs Assistance Bed Mobility: Supine to Sit     Supine to sit: Mod assist;+2 for safety/equipment     General bed mobility comments: multi-modal cues for technique and self assist; requires assist with trunk, LEs, bed pad used to scoot to EOB  Transfers Overall transfer level: Needs assistance Equipment used: Rolling walker (2 wheeled) Transfers: Sit to/from Stand Sit to Stand: Min assist;+2 safety/equipment;+2 physical assistance          General transfer comment: cues for hand placement and overall safety    Balance                                            ADL Overall ADL's : Needs assistance/impaired Eating/Feeding: Set up;Sitting   Grooming: Wash/dry hands;Wash/dry face;Set up;Sitting   Upper Body Bathing: Minimal assitance;Sitting   Lower Body Bathing: Maximal assistance;Total assistance;Sit to/from stand   Upper Body Dressing : Minimal assistance;Sitting   Lower Body Dressing: Maximal assistance;Total assistance;Sit to/from stand   Toilet Transfer: Moderate assistance;+2 for safety/equipment;Stand-pivot;BSC;RW           Functional mobility during ADLs: Moderate assistance;Minimal assistance;+2 for safety/equipment;Rolling walker       Vision     Perception     Praxis      Pertinent Vitals/Pain Pain Assessment: Faces Faces Pain Scale: Hurts little more Pain Location: R hip with mobility Pain Descriptors / Indicators: Aching;Sore Pain Intervention(s): Limited activity within patient's tolerance;Monitored during session;Repositioned;Ice applied     Hand Dominance Right   Extremity/Trunk Assessment Upper Extremity Assessment Upper Extremity Assessment: Overall WFL for tasks assessed   Lower Extremity Assessment Lower Extremity Assessment: Defer to PT evaluation RLE Deficits / Details: grossly 2+ to 3/5       Communication Communication Communication: No difficulties   Cognition Arousal/Alertness: Awake/alert Behavior During Therapy: Anxious Overall Cognitive Status: No family/caregiver present to determine baseline cognitive functioning Area of Impairment: Attention;Following commands;Problem solving;Safety/judgement   Current Attention Level: Sustained   Following Commands: Follows one step commands consistently Safety/Judgement: Decreased  awareness of safety;Decreased awareness of deficits   Problem Solving: Requires verbal cues;Requires tactile  cues General Comments: pt with tangential thoughts, requires frequent redirection to current situation/questions/tasks   General Comments       Exercises       Shoulder Instructions      Home Living Family/patient expects to be discharged to:: Unsure Living Arrangements: Children Available Help at Discharge: Family Type of Home: House       Home Layout: One level;Laundry or work area in [employer] Equipment: Environmental consultant - 2 wheels;Gilmer Mor - single point   Additional Comments: Patient lives on main floor; daughter lives in basement. Has 2 canes, walker. Walk-in shower with seat and grab bars. Daughter supervises pt while showering. Has had some recent falls. Does own meal prep, cleans. Does not drive. pt reports her dtr falls as well      Prior Functioning/Environment Level of Independence: Independent        Comments: occasional use of a cane    OT Diagnosis: Acute pain   OT Problem List: Decreased strength;Decreased range of motion;Decreased activity tolerance;Impaired balance (sitting and/or standing);Decreased safety awareness;Decreased knowledge of use of DME or AE;Decreased knowledge of precautions;Pain   OT Treatment/Interventions: Self-care/ADL training;DME and/or AE instruction;Therapeutic activities;Patient/family education    OT Goals(Current goals can be found in the care plan section) Acute Rehab OT Goals Patient Stated Goal:  to go home OT Goal Formulation: With patient Time For Goal Achievement: 12/15/15 Potential to Achieve Goals: Good ADL Goals Pt Will Perform Lower Body Bathing: with supervision;sit to/from stand;with adaptive equipment Pt Will Perform Lower Body Dressing: with supervision;with adaptive equipment;sit to/from stand Pt Will Transfer to Toilet: with supervision;ambulating;bedside commode Pt Will Perform Toileting - Clothing Manipulation and hygiene: with supervision;sit to/from stand Pt Will Perform Tub/Shower Transfer:  with min assist;ambulating;shower seat;rolling walker  OT Frequency: Min 2X/week   Barriers to D/C: Decreased caregiver support  daughter is disabled as well and falls       Co-evaluation PT/OT/SLP Co-Evaluation/Treatment: Yes Reason for Co-Treatment: For patient/therapist safety PT goals addressed during session: Mobility/safety with mobility OT goals addressed during session: ADL's and self-care      End of Session Equipment Utilized During Treatment: Gait belt;Rolling walker Nurse Communication: Mobility status;Patient requests pain meds  Activity Tolerance: Patient tolerated treatment well Patient left: in chair;with call bell/phone within reach;with chair alarm set   Time: 1610-9604 OT Time Calculation (min): 26 min Charges:  OT General Charges $OT Visit: 1 Procedure OT Evaluation $OT Eval Moderate Complexity: 1 Procedure G-Codes:    Mikayla Wilson A 12-22-15, 12:58 PM

## 2015-12-01 NOTE — Progress Notes (Signed)
Subjective: 1 Day Post-Op Procedure(s) (LRB): INTRAMEDULLARY (IM) NAIL INTERTROCHANTERIC RIGHT HIP (Right) Patient reports pain as moderate.   Acute blood loss anemia from her fracture and surgery.  Objective: Vital signs in last 24 hours: Temp:  [97.8 F (36.6 C)-98.2 F (36.8 C)] 98.2 F (36.8 C) (05/20 0435) Pulse Rate:  [79-114] 82 (05/20 0435) Resp:  [13-22] 15 (05/20 0435) BP: (104-134)/(54-90) 104/54 mmHg (05/20 0435) SpO2:  [96 %-100 %] 99 % (05/20 0435) Weight:  [68.947 kg (152 lb)] 68.947 kg (152 lb) (05/19 1330)  Intake/Output from previous day: 05/19 0701 - 05/20 0700 In: 2312.5 [I.V.:2312.5] Out: 675 [Urine:525; Blood:150] Intake/Output this shift:     Recent Labs  11/30/15 1418 12/01/15 0423  HGB 12.6 9.3*    Recent Labs  11/30/15 1418 12/01/15 0423  WBC 12.2* 12.5*  RBC 3.94 2.91*  HCT 36.7 27.2*  PLT 185 164    Recent Labs  11/30/15 1418 12/01/15 0423  NA 141 138  K 3.4* 4.1  CL 108 106  CO2 25 26  BUN 8 6  CREATININE 0.76 0.79  GLUCOSE 146* 193*  CALCIUM 9.1 8.0*    Recent Labs  11/30/15 1418 12/01/15 0423  INR 1.17 1.27    Sensation intact distally Intact pulses distally Dorsiflexion/Plantar flexion intact Incision: scant drainage  Assessment/Plan: 1 Day Post-Op Procedure(s) (LRB): INTRAMEDULLARY (IM) NAIL INTERTROCHANTERIC RIGHT HIP (Right) Up with therapy - WBAT right hip  Malillany Kazlauskas Y 12/01/2015, 10:33 AM

## 2015-12-01 NOTE — Discharge Instructions (Signed)
You may put all of your weight on your right hip, but only be up with a walker and assistance.    Information on my medicine - Coumadin   (Warfarin)  This medication education was reviewed with me or my healthcare representative as part of my discharge preparation.  The pharmacist that spoke with me during my hospital stay was:  Toron Bowring, [facility]  Why was Coumadin prescribed for you? Coumadin was prescribed for you because you have a blood clot or a medical condition that can cause an increased risk of forming blood clots. Blood clots can cause serious health problems by blocking the flow of blood to the heart, lung, or brain. Coumadin can prevent harmful blood clots from forming. As a reminder your indication for Coumadin is:   Blood Clot Prevention After Orthopedic Surgery  What test will check on my response to Coumadin? While on Coumadin (warfarin) you will need to have an INR test regularly to ensure that your dose is keeping you in the desired range. The INR (international normalized ratio) number is calculated from the result of the laboratory test called prothrombin time (PT).  If an INR APPOINTMENT HAS NOT ALREADY BEEN MADE FOR YOU please schedule an appointment to have this lab work done by your health care provider within 7 days. Your INR goal is usually a number between:  2 to 3 or your provider may give you a more narrow range like 2-2.5.  Ask your health care provider during an office visit what your goal INR is.  What  do you need to  know  About  COUMADIN? Take Coumadin (warfarin) exactly as prescribed by your healthcare provider about the same time each day.  DO NOT stop taking without talking to the doctor who prescribed the medication.  Stopping without other blood clot prevention medication to take the place of Coumadin may increase your risk of developing a new clot or stroke.  Get refills before you run out.  What do you do if you miss a dose? If you miss a dose, take  it as soon as you remember on the same day then continue your regularly scheduled regimen the next day.  Do not take two doses of Coumadin at the same time.  Important Safety Information A possible side effect of Coumadin (Warfarin) is an increased risk of bleeding. You should call your healthcare provider right away if you experience any of the following: ? Bleeding from an injury or your nose that does not stop. ? Unusual colored urine (red or dark brown) or unusual colored stools (red or black). ? Unusual bruising for unknown reasons. ? A serious fall or if you hit your head (even if there is no bleeding).  Some foods or medicines interact with Coumadin (warfarin) and might alter your response to warfarin. To help avoid this: ? Eat a balanced diet, maintaining a consistent amount of Vitamin K. ? Notify your provider about major diet changes you plan to make. ? Avoid alcohol or limit your intake to 1 drink for women and 2 drinks for men per day. (1 drink is 5 oz. wine, 12 oz. beer, or 1.5 oz. liquor.)  Make sure that ANY health care provider who prescribes medication for you knows that you are taking Coumadin (warfarin).  Also make sure the healthcare provider who is monitoring your Coumadin knows when you have started a new medication including herbals and non-prescription products.  Coumadin (Warfarin)  Major Drug Interactions  Increased Warfarin Effect Decreased  Warfarin Effect  Alcohol (large quantities) Antibiotics (esp. Septra/Bactrim, Flagyl, Cipro) Amiodarone (Cordarone) Aspirin (ASA) Cimetidine (Tagamet) Megestrol (Megace) NSAIDs (ibuprofen, naproxen, etc.) Piroxicam (Feldene) Propafenone (Rythmol SR) Propranolol (Inderal) Isoniazid (INH) Posaconazole (Noxafil) Barbiturates (Phenobarbital) Carbamazepine (Tegretol) Chlordiazepoxide (Librium) Cholestyramine (Questran) Griseofulvin Oral Contraceptives Rifampin Sucralfate (Carafate) Vitamin K   Coumadin (Warfarin)  Major Herbal Interactions  Increased Warfarin Effect Decreased Warfarin Effect  Garlic Ginseng Ginkgo biloba Coenzyme Q10 Green tea St. Johns wort    Coumadin (Warfarin) FOOD Interactions  Eat a consistent number of servings per week of foods HIGH in Vitamin K (1 serving =  cup)  Collards (cooked, or boiled & drained) Kale (cooked, or boiled & drained) Mustard greens (cooked, or boiled & drained) Parsley *serving size only =  cup Spinach (cooked, or boiled & drained) Swiss chard (cooked, or boiled & drained) Turnip greens (cooked, or boiled & drained)  Eat a consistent number of servings per week of foods MEDIUM-HIGH in Vitamin K (1 serving = 1 cup)  Asparagus (cooked, or boiled & drained) Broccoli (cooked, boiled & drained, or raw & chopped) Brussel sprouts (cooked, or boiled & drained) *serving size only =  cup Lettuce, raw (green leaf, endive, romaine) Spinach, raw Turnip greens, raw & chopped   These websites have more information on Coumadin (warfarin):  http://www.king-russell.com/; https://www.hines.net/;

## 2015-12-01 NOTE — Op Note (Signed)
NAMEJERRIANN, Mikayla Wilson NO.:  [id number]  MEDICAL RECORD NO.:  [id number]  LOCATION:  1606                         FACILITY:  [facility]  PHYSICIAN:  Vanita Panda. Magnus Ivan, M.D.DATE OF BIRTH:  February 06, 1931  DATE OF PROCEDURE:  11/30/2015 DATE OF DISCHARGE:                              OPERATIVE REPORT   PREOPERATIVE DIAGNOSIS:  Right hip closed, displaced, unstable intertrochanteric femur fracture.  POSTOPERATIVE DIAGNOSIS:  Right hip closed, displaced, unstable intertrochanteric femur fracture.  PROCEDURE:  Open reduction and internal fixation of right intertrochanteric hip fracture using intramedullary rod and hip screw construct.  IMPLANTS:  [employer]hew 10-mm x 36-cm right INTERTAN nail with integrated interlocking lag screw system using 95-mm lag screw and a 90- mm compression screw.  SURGEON:  Vanita Panda. Magnus Ivan, M.D.  ANESTHESIA:  General.  ANTIBIOTICS:  2 g of IV Ancef.  BLOOD LOSS:  150 mL.  COMPLICATIONS:  None.  INDICATIONS:  Mikayla Wilson is an 82 year old female, who was working in her yard today when she sustained an accidental witnessed mechanical fall. She denied any shortness of breath, chest pain or syncopal episode.  She fell directly on her right hip and had the inability to ambulate with severe right hip pain.  She was transferred to the [facility] Emergency Room and x-rays were obtained and showed a right hip intertrochanteric fracture with significant displacement.  She was seen by the Medicine Service as well as Orthopedics and were both consulted for this.  She was cleared from a Medicine standpoint for surgery and I talked to she and her family in length about the risks and benefits of the surgery and the reason behind proceeding with surgery and they did wish to proceed.  PROCEDURE DESCRIPTION:  After informed consent was obtained, appropriate right hip was marked.  She was brought to the operating room where general  anesthesia was obtained while she was on her stretcher.  A Foley catheter was placed.  Next, she was placed supine on the fracture table with the right operative leg in in-line skeletal traction and the left hip placed in an abducted and flexed position with appropriate padding in the popliteal area as a perineal post in place as well.  Under direct fluoroscopy, we then reduced her fracture with traction and in slight internal rotation.  We then chose our rod under direct fluoroscopic guidance keeping it sterile in the box choosing a 10 x 36 femoral nail. We then prepped her right hip with DuraPrep and sterile drapes.  Time- out was called and she was identified as correct patient and correct right hip.  I then made an incision over the greater trochanter and carried this proximally.  We dissected down the tip of greater trochanter and felt that easily and under direct fluoroscopic guidance, we placed a temporary guidepin in antegrade fashion.  We then used an initiating reamer to open up the femoral canal and then placed our femoral rod down the canal without difficulty removing the guidepin.  We then made a separate incision on the lateral thigh and we were able to place our drill under direct fluoroscopic guidance into the neck and femoral head and taken measurements off this  did to choose our 95-mm/90- mm integrated interlocking lag screw system.  We placed these without difficulty and let some traction off the leg so we could compress the fracture.  We again verified this all under fluoroscopy.  We then removed the outrigger guide and I irrigated both wounds with normal saline solution.  We closed the deep tissue with 0 Vicryl followed by 2- 0 Vicryl in the subcutaneous tissue and interrupted staples on the skin. Well-padded sterile dressing was applied.  She was taken off the fracture table, awakened, extubated and taken to the recovery room in stable condition.  All final counts  were correct.  There were no complications noted.     Vanita Pandahristopher Y. Magnus IvanBlackman, M.D.     CYB/MEDQ  D:  11/30/2015  T:  12/01/2015  Job:  [id number]

## 2015-12-01 NOTE — Progress Notes (Addendum)
PROGRESS NOTE    Mikayla Wilson  ZOX:[id number] DOB: 10-May-1931 DOA: 11/30/2015  PCP: Kevin FentonSamuel Bradshaw, MD   Brief Narrative:  Mikayla LampJosephine M Wilson is a 82 y.o. female with medical history significant of RBBB, hiatal hernia, anxiety, hyperlipidemia, GERD and asthma who presents with a right intertronchanteric femur fracture while doing yard work and falling. She has no complaints of chest pain or dyspnea on exertion. She has been having 2-3 BMs a day (not all are loose) for the past 2-3 weeks. She states it started after Prednisone was started for respiratory issues.   Subjective: Had some pain when moving from bed to chair. No other complaints. No diarrhea today.   Assessment & Plan:   Principal Problem:   Right Hip fracture requiring operative repair  - s/p IM nailing on 5/19- Coumadin for DVT prophylaxis per ortho - PT recommends SNF vs 24 hr home care  Active Problems:  Anemia acute blood loss - Hb dropped by 3 grams - follow  Anxiety - per PCPs notes she takes Ativan 1mg  3-4 x day- med rec states BID- will order it BID - Cymbalta   HLD (hyperlipidemia) - Rosuvastatin   GERD (gastroesophageal reflux disease) - Prilosec- states this did not cause her diarrhea and she needs it   Asthma - Xopenex PRN  RBBB - per history and EKG   DVT prophylaxis:Coumadin/ SCDs Code Status: Full code Family Communication: son Disposition Plan: home vs SNF in 1-2 days Consultants:   ortho Procedures:   IM nailing of right hip Antimicrobials:  Anti-infectives    Start     Dose/Rate Route Frequency Ordered Stop   12/01/15 0000  ceFAZolin (ANCEF) IVPB 2g/100 mL premix     2 g 200 mL/hr over 30 Minutes Intravenous Every 6 hours 11/30/15 2108 12/01/15 0548   11/30/15 1700  ceFAZolin (ANCEF) IVPB 2g/100 mL premix     2 g 200 mL/hr over 30 Minutes Intravenous  Once 11/30/15 1657 11/30/15 1806       Objective: Filed Vitals:   11/30/15 2258 12/01/15 0016 12/01/15 0250  12/01/15 0435  BP: 120/67 107/58 121/69 104/54  Pulse: 97 87 89 82  Temp: 98.1 F (36.7 C) 98.2 F (36.8 C) 98.1 F (36.7 C) 98.2 F (36.8 C)  TempSrc: Oral Oral Oral Oral  Resp: 16 16 16 15   Height:      Weight:      SpO2: 98% 99% 100% 99%    Intake/Output Summary (Last 24 hours) at 12/01/15 1302 Last data filed at 12/01/15 1045  Gross per 24 hour  Intake 2672.5 ml  Output   1000 ml  Net 1672.5 ml   Filed Weights   11/30/15 1330  Weight: 68.947 kg (152 lb)    Examination: General exam: Appears comfortable  HEENT: PERRLA, oral mucosa moist, no sclera icterus or thrush Respiratory system: Clear to auscultation. Respiratory effort normal. Cardiovascular system: S1 & S2 heard, RRR.  No murmurs  Gastrointestinal system: Abdomen soft, non-tender, nondistended. Normal bowel sound. No organomegaly Central nervous system: Alert and oriented. No focal neurological deficits. Extremities: No cyanosis, clubbing or edema Skin: No rashes or ulcers Psychiatry:  Mood & affect appropriate.     Data Reviewed: I have personally reviewed following labs and imaging studies  CBC:  Recent Labs Lab 11/30/15 1418 12/01/15 0423  WBC 12.2* 12.5*  NEUTROABS 9.2*  --   HGB 12.6 9.3*  HCT 36.7 27.2*  MCV 93.1 93.5  PLT 185 164   Basic Metabolic  Panel:  Recent Labs Lab 11/30/15 1418 12/01/15 0423  NA 141 138  K 3.4* 4.1  CL 108 106  CO2 25 26  GLUCOSE 146* 193*  BUN 8 6  CREATININE 0.76 0.79  CALCIUM 9.1 8.0*   GFR: Estimated Creatinine Clearance: 51.7 mL/min (by C-G formula based on Cr of 0.79). Liver Function Tests: No results for input(s): AST, ALT, ALKPHOS, BILITOT, PROT, ALBUMIN in the last 168 hours. No results for input(s): LIPASE, AMYLASE in the last 168 hours. No results for input(s): AMMONIA in the last 168 hours. Coagulation Profile:  Recent Labs Lab 11/30/15 1418 12/01/15 0423  INR 1.17 1.27   Cardiac Enzymes: No results for input(s): CKTOTAL, CKMB,  CKMBINDEX, TROPONINI in the last 168 hours. BNP (last 3 results) No results for input(s): PROBNP in the last 8760 hours. HbA1C: No results for input(s): HGBA1C in the last 72 hours. CBG: No results for input(s): GLUCAP in the last 168 hours. Lipid Profile: No results for input(s): CHOL, HDL, LDLCALC, TRIG, CHOLHDL, LDLDIRECT in the last 72 hours. Thyroid Function Tests: No results for input(s): TSH, T4TOTAL, FREET4, T3FREE, THYROIDAB in the last 72 hours. Anemia Panel: No results for input(s): VITAMINB12, FOLATE, FERRITIN, TIBC, IRON, RETICCTPCT in the last 72 hours. Urine analysis:    Component Value Date/Time   COLORURINE YELLOW 01/25/2015 2330   APPEARANCEUR CLEAR 01/25/2015 2330   LABSPEC 1.015 01/25/2015 2330   PHURINE 6.0 01/25/2015 2330   GLUCOSEU NEGATIVE 01/25/2015 2330   HGBUR SMALL* 01/25/2015 2330   BILIRUBINUR NEGATIVE 01/25/2015 2330   KETONESUR NEGATIVE 01/25/2015 2330   PROTEINUR NEGATIVE 01/25/2015 2330   UROBILINOGEN 0.2 01/25/2015 2330   NITRITE NEGATIVE 01/25/2015 2330   LEUKOCYTESUR TRACE* 01/25/2015 2330   Sepsis Labs: (procalcitonin:4,lacticidven:4) )No results found for this or any previous visit (from the past 240 hour(s)).       Radiology Studies: Dg Chest 1 View  11/30/2015  CLINICAL DATA:  Fall, landed on RIGHT side.  RIGHT-sided pain EXAM: CHEST 1 VIEW COMPARISON:  09/24/2015 FINDINGS: Normal mediastinum and cardiac silhouette. Lungs are hyperinflated. No effusion, infiltrate pneumothorax. No evidence of fracture. Pulmonary is a skin fold over LEFT upper hemi thorax. IMPRESSION: No evidence of thoracic trauma Emphysematous change. Electronically Signed   By: Genevive Bi M.D.   On: 11/30/2015 14:22   Dg Lumbar Spine Complete  11/30/2015  CLINICAL DATA:  Fall today.  Landed on RIGHT side. EXAM: LUMBAR SPINE - COMPLETE 4+ VIEW COMPARISON:  None. FINDINGS: Normal alignment of lumbar vertebral bodies. No loss of vertebral body height or  disc height. There is osteopenia noted. Multiple levels of endplate spurring. IMPRESSION: No acute findings lumbar spine. Electronically Signed   By: Genevive Bi M.D.   On: 11/30/2015 14:19   Dg Pelvis 1-2 Views  11/30/2015  CLINICAL DATA:  Fall with right hip pain EXAM: PELVIS - 1-2 VIEW COMPARISON:  None. FINDINGS: There is a comminuted intertrochanteric right proximal femur fracture with mild apex lateral angulation. No additional fracture in the pelvis. No diastasis or suspicious focal osseous lesion. No evidence of hip dislocation on this single frontal view. Mild degenerative changes in the visualized lower lumbar spine. IMPRESSION: Comminuted intertrochanteric right proximal femur fracture. Electronically Signed   By: Delbert Phenix M.D.   On: 11/30/2015 14:17   Dg C-arm 1-60 Min-no Report  11/30/2015  CLINICAL DATA: right hip IM nail C-ARM 1-60 MINUTES Fluoroscopy was utilized by the requesting physician.  No radiographic interpretation.   Dg Hip Operative Unilat  W Or W/o Pelvis Right  11/30/2015  CLINICAL DATA:  Intraoperative intramedullary nail intertrochanteric right hip EXAM: OPERATIVE right HIP (WITH PELVIS IF PERFORMED) 5 VIEWS TECHNIQUE: Fluoroscopic spot image(s) were submitted for interpretation post-operatively. FLUOROSCOPY TIME:  1 minutes 12 seconds COMPARISON:  Right hip radiographs dated 11/30/2014 at 1344 seconds FINDINGS: Intraoperative fluoroscopic images during IM nail with dynamic hip screw fixation of an intertrochanteric right hip fracture. Fracture fragments are in near anatomic alignment. IMPRESSION: Intraoperative fluoroscopic images during ORIF of an intertrochanteric right hip fracture, as above. Electronically Signed   By: Charline Bills M.D.   On: 11/30/2015 18:51   Dg Femur, Min 2 Views Right  11/30/2015  CLINICAL DATA:  Fall.  Right hip pain. EXAM: RIGHT FEMUR 2 VIEWS COMPARISON:  None. FINDINGS: There is a comminuted intertrochanteric right proximal femur  fracture with mild apex lateral angulation, mild impaction and mild 6 mm medial displacement of the dominant distal fracture fragment. No additional fracture. No dislocation at the right hip or right knee. No suspicious focal osseous lesion. IMPRESSION: Comminuted intertrochanteric right proximal femur fracture as described. Electronically Signed   By: Delbert Phenix M.D.   On: 11/30/2015 14:19      Scheduled Meds: . diphenoxylate-atropine  5 mL Oral Once  . docusate sodium  100 mg Oral BID  . DULoxetine  20 mg Oral Daily  . ketotifen  1 drop Both Eyes BID  . pantoprazole  40 mg Oral Daily  . potassium chloride  40 mEq Oral Once  . Warfarin - Pharmacist Dosing Inpatient   Does not apply q1800   Continuous Infusions: . sodium chloride 125 mL/hr at 12/01/15 0300  . sodium chloride       LOS: 1 day    Time spent in minutes: 35    Qamar Rosman, MD [facility] Pager: [email or web address] Password [email or web address] 12/01/2015, 1:02 PM

## 2015-12-02 LAB — CBC
HCT: 23.7 % — ABNORMAL LOW (ref 36.0–46.0)
HEMOGLOBIN: 8.1 g/dL — AB (ref 12.0–15.0)
MCH: 32 pg (ref 26.0–34.0)
MCHC: 34.2 g/dL (ref 30.0–36.0)
MCV: 93.7 fL (ref 78.0–100.0)
PLATELETS: 175 10*3/uL (ref 150–400)
RBC: 2.53 MIL/uL — ABNORMAL LOW (ref 3.87–5.11)
RDW: 13.3 % (ref 11.5–15.5)
WBC: 15 10*3/uL — ABNORMAL HIGH (ref 4.0–10.5)

## 2015-12-02 LAB — BASIC METABOLIC PANEL
Anion gap: 5 (ref 5–15)
BUN: 11 mg/dL (ref 6–20)
CHLORIDE: 107 mmol/L (ref 101–111)
CO2: 28 mmol/L (ref 22–32)
CREATININE: 0.64 mg/dL (ref 0.44–1.00)
Calcium: 8.5 mg/dL — ABNORMAL LOW (ref 8.9–10.3)
GFR calc Af Amer: >60 mL/min (ref >60)
GFR calc non Af Amer: >60 mL/min (ref >60)
GLUCOSE: 158 mg/dL — AB (ref 65–99)
Potassium: 3.2 mmol/L — ABNORMAL LOW (ref 3.5–5.1)
SODIUM: 140 mmol/L (ref 135–145)

## 2015-12-02 LAB — PROTIME-INR
INR: 1.51 — ABNORMAL HIGH (ref 0.00–1.49)
Prothrombin Time: 17.7 seconds — ABNORMAL HIGH (ref 11.6–15.2)

## 2015-12-02 LAB — URINALYSIS, ROUTINE W REFLEX MICROSCOPIC
BILIRUBIN URINE: NEGATIVE
GLUCOSE, UA: NEGATIVE mg/dL
HGB URINE DIPSTICK: NEGATIVE
Ketones, ur: NEGATIVE mg/dL
Leukocytes, UA: NEGATIVE
Nitrite: NEGATIVE
PH: 6 (ref 5.0–8.0)
Protein, ur: NEGATIVE mg/dL
SPECIFIC GRAVITY, URINE: 1.007 (ref 1.005–1.030)

## 2015-12-02 MED ORDER — LORAZEPAM 2 MG/ML IJ SOLN
INTRAMUSCULAR | Status: AC
  Filled 2015-12-02: qty 1

## 2015-12-02 MED ORDER — WARFARIN SODIUM 4 MG PO TABS
4.0000 mg | ORAL_TABLET | Freq: Once | ORAL | Status: AC
  Administered 2015-12-02: 4 mg via ORAL
  Filled 2015-12-02: qty 1

## 2015-12-02 MED ORDER — LORAZEPAM 1 MG PO TABS
1.0000 mg | ORAL_TABLET | Freq: Two times a day (BID) | ORAL | Status: DC | PRN
  Administered 2015-12-02: 1 mg via ORAL
  Filled 2015-12-02: qty 1

## 2015-12-02 MED ORDER — HALOPERIDOL LACTATE 5 MG/ML IJ SOLN: 2.0000 mg | Freq: Once | INTRAMUSCULAR | Status: DC | PRN

## 2015-12-02 MED ORDER — HALOPERIDOL LACTATE 5 MG/ML IJ SOLN
INTRAMUSCULAR | Status: AC
  Administered 2015-12-02: 2 mg
  Filled 2015-12-02: qty 1

## 2015-12-02 MED ORDER — QUETIAPINE FUMARATE 25 MG PO TABS: 25.0000 mg | ORAL_TABLET | Freq: Two times a day (BID) | ORAL | Status: DC

## 2015-12-02 MED ORDER — ENSURE ENLIVE PO LIQD
237.0000 mL | Freq: Two times a day (BID) | ORAL | Status: DC
  Administered 2015-12-02 – 2015-12-03 (×4): 237 mL via ORAL

## 2015-12-02 MED ORDER — LIP MEDEX EX OINT
TOPICAL_OINTMENT | CUTANEOUS | Status: AC
  Filled 2015-12-02: qty 7

## 2015-12-02 MED ORDER — LORAZEPAM 2 MG/ML IJ SOLN
1.0000 mg | Freq: Once | INTRAMUSCULAR | Status: AC
  Administered 2015-12-02: 1 mg via INTRAVENOUS

## 2015-12-02 MED ORDER — HALOPERIDOL LACTATE 5 MG/ML IJ SOLN: 2.0000 mg | Freq: Four times a day (QID) | INTRAMUSCULAR | Status: DC | PRN

## 2015-12-02 MED ORDER — NICOTINE 7 MG/24HR TD PT24
7.0000 mg | MEDICATED_PATCH | Freq: Every day | TRANSDERMAL | Status: DC
  Filled 2015-12-02 (×3): qty 1

## 2015-12-02 NOTE — Progress Notes (Signed)
ANTICOAGULATION CONSULT NOTE - Follow-Up  Pharmacy Consult for Warfarin Indication: VTE prophylaxis  Allergies  Allergen Reactions  . Aspirin Other (See Comments)    "My doctor told me to never take aspirin"  . Morphine And Related Other (See Comments)   Patient Measurements: Height: 5\' 4"  (162.6 cm) Weight: 152 lb (68.947 kg) IBW/kg (Calculated) : 54.7  Vital Signs: Temp: 98 F (36.7 C) (05/21 0614) Temp Source: Axillary (05/21 0614) BP: 97/46 mmHg (05/21 0614) Pulse Rate: 109 (05/21 0614)  Labs:  Recent Labs  11/30/15 1418 12/01/15 0423 12/02/15 0954  HGB 12.6 9.3* 8.1*  HCT 36.7 27.2* 23.7*  PLT 185 164 175  LABPROT 14.7 15.6* 17.7*  INR 1.17 1.27 1.51*  CREATININE 0.76 0.79 0.64   Estimated Creatinine Clearance: 51.7 mL/min (by C-G formula based on Cr of 0.64).  Medical History: Past Medical History  Diagnosis Date  . Arthritis   . Coronary artery disease   . Hiatal hernia   . Fibrocystic breast disease   . Asthma   . Depression   . Allergy   . Anxiety   . Cataract   . GERD (gastroesophageal reflux disease)   . Heart murmur   . Osteoporosis   . Constipation   . Hiatal hernia    Medications:  Scheduled:  . diphenoxylate-atropine  5 mL Oral Once  . docusate sodium  100 mg Oral BID  . DULoxetine  20 mg Oral Daily  . feeding supplement (ENSURE ENLIVE)  237 mL Oral BID BM  . ketotifen  1 drop Both Eyes BID  . LORazepam      . nicotine  7 mg Transdermal Daily  . pantoprazole  40 mg Oral Daily  . potassium chloride  40 mEq Oral Once  . Warfarin - Pharmacist Dosing Inpatient   Does not apply q1800   Assessment: [age] yoF s/p Hip fx repair, Warfarin for VTE prophylaxis. Baseline INR wnl, no anti-coagulants PTA.  Today, 12/02/2015   INR 1.51, subtherapeutic  Hgb decreased from 12.6 to 9.3,  Now at 8.1, will monitor  Patient education completed on 5/20  Poor appetite 0-50% meals eaten    Goal of Therapy:  INR 2-3 Monitor platelets by  anticoagulation protocol: Yes   Plan:   Warfarin 4mg  tonight  Daily CBC, Protime/INR  Monitor for signs/symtoms of bleeding  Follow Hgb closely  Adalberto ColeNikola Awad Gladd, PharmD, BCPS Pager [phone] 12/02/2015 12:46 PM

## 2015-12-02 NOTE — Progress Notes (Signed)
Patient ID: Mikayla Wilson, female   DOB: 26-Jan-1931, 82 y.o.   MRN: [id number] Patient is status post internal fixation for an intertrochanteric hip fracture. Patient is not oriented this morning she has no complaints of pain. Anticipate discharge to skilled nursing.

## 2015-12-02 NOTE — Progress Notes (Signed)
Tried to administer nicotine patch due to fact that pt sniff snuff at home, she refused to let me apply it.

## 2015-12-02 NOTE — Progress Notes (Signed)
Physical Therapy Treatment Patient Details Name: Mikayla Wilson MRN: [id number] DOB: Mar 23, 1931 Today's Date: 12/02/2015    History of Present Illness 82 yo female adm 11/30/15 after fall resulting in righ hip fx, s/p ORIF right hip per Dr. Magnus Ivan; PMHx:  RBBB, hiatal hernia, anxiety, depression, hyperlipidemia, GERD and asthma, +tobacco (dips snuff)     PT Comments    POD # 2 pm session Assisted OOB to amb a greater distance then back to bed.  Pt more agreeable to D/C to SNF after much direction.    Follow Up Recommendations  SNF     Equipment Recommendations       Recommendations for Other Services       Precautions / Restrictions Precautions Precautions: Fall Precaution Comments: reports multiple recent falls at home lately Restrictions Weight Bearing Restrictions: No Other Position/Activity Restrictions: WBAT RLE    Mobility  Bed Mobility Overal bed mobility: Needs Assistance Bed Mobility: Supine to Sit     Supine to sit: Max assist;+2 for physical assistance;+2 for safety/equipment     General bed mobility comments: assisted OOB then back into bed  Transfers Overall transfer level: Needs assistance Equipment used: Rolling walker (2 wheeled) Transfers: Sit to/from Stand Sit to Stand: Min assist;+2 safety/equipment;+2 physical assistance         General transfer comment: cues for hand placement and overall safety  Ambulation/Gait Ambulation/Gait assistance: Min assist;Mod assist;+2 safety/equipment Ambulation Distance (Feet): 11 Feet Assistive device: Rolling walker (2 wheeled) Gait Pattern/deviations: Step-to pattern;Decreased stance time - right     General Gait Details: 75% VC's on proper sequencing and walker to self distance.  great difficulty tolerating WBing thru R LE.  Very unsteady gait.     Stairs            Wheelchair Mobility    Modified Rankin (Stroke Patients Only)       Balance                                     Cognition Arousal/Alertness: Awake/alert Behavior During Therapy: Anxious                        Exercises      General Comments        Pertinent Vitals/Pain Pain Assessment: Faces Faces Pain Scale: Hurts even more Pain Location: R hip Pain Descriptors / Indicators: Grimacing Pain Intervention(s): Monitored during session;Repositioned;Ice applied    Home Living                      Prior Function            PT Goals (current goals can now be found in the care plan section) Progress towards PT goals: Progressing toward goals    Frequency  Min 5X/week    PT Plan Current plan remains appropriate    Co-evaluation             End of Session Equipment Utilized During Treatment: Gait belt Activity Tolerance: No increased pain;Patient limited by fatigue Patient left: in chair;with call bell/phone within reach;with chair alarm set     Time: 1317-1330 PT Time Calculation (min) (ACUTE ONLY): 13 min  Charges:  $Gait Training: 8-22 mins $Therapeutic Activity: 8-22 mins                    G Codes:  Armando ReichertKropski, Nickole Adamek Ann 12/02/2015, 3:41 PM

## 2015-12-02 NOTE — Progress Notes (Signed)
Pt pulled out her IV in spite of it being wrapped up in a towel.

## 2015-12-02 NOTE — Progress Notes (Signed)
Pt very confused and combative. Refuses to allow me or any staff to  to touch her. Sitter with . Daughter [name] called and says will come. Awake alert. Color wnl. Right foot and ankle color wnl and patient able to move it.

## 2015-12-02 NOTE — Progress Notes (Signed)
Pt just called the front desk and said she needed someone to "call the law because she is in here with a bunch of nuts" She asked for someone to come see her that didn't work at [facility]. She called the front desk and started talking to her son and daughter like they had answered.  The Haldol hadn't been very effective.

## 2015-12-02 NOTE — Progress Notes (Signed)
PROGRESS NOTE    Mikayla Wilson  ZOX:[id number] DOB: Aug 31, 1930 DOA: 11/30/2015  PCP: Kevin Fenton, MD   Brief Narrative:  Mikayla Wilson is a 82 y.o. female with medical history significant of RBBB, hiatal hernia, anxiety, hyperlipidemia, GERD and asthma who presents with a right intertronchanteric femur fracture while doing yard work and falling. She has no complaints of chest pain or dyspnea on exertion. She has been having 2-3 BMs a day (not all are loose) for the past 2-3 weeks. She states it started after Prednisone was started for respiratory issues.   Subjective: Was very confused, agitated and angry through the night despite taking her usual home doses of Ativan. Haldol 5 mg and Ativan  IV did not help. Pulled out IV. She is now back to baseline.   Assessment & Plan:   Principal Problem:   Right Hip fracture requiring operative repair  - s/p IM nailing on 5/19- Coumadin for DVT prophylaxis per ortho - PT recommends SNF vs 24 hr home care  Active Problems:  Anemia acute blood loss - Hb dropped by 3 grams - following  Sundowning - will order Seroquel QHS PRN if it occurs again tonight  Anxiety - Ativan  BID - Cymbalta   HLD (hyperlipidemia) - Rosuvastatin   GERD (gastroesophageal reflux disease) - Prilosec- states this did not cause her diarrhea and she needs it   Asthma - Xopenex PRN  RBBB - per history and EKG   DVT prophylaxis:Coumadin/ SCDs Code Status: Full code Family Communication: son Disposition Plan: home vs SNF   Consultants:   ortho Procedures:   IM nailing of right hip Antimicrobials:  Anti-infectives    Start     Dose/Rate Route Frequency Ordered Stop   12/01/15 0000  ceFAZolin (ANCEF) IVPB 2g/100 mL premix     2 g 200 mL/hr over 30 Minutes Intravenous Every 6 hours 11/30/15 2108 12/01/15 0548   11/30/15 1700  ceFAZolin (ANCEF) IVPB 2g/100 mL premix     2 g 200 mL/hr over 30 Minutes Intravenous  Once 11/30/15 1657  11/30/15 1806       Objective: Filed Vitals:   12/01/15 0435 12/01/15 1445 12/01/15 2130 12/02/15 0614  BP: 104/54 114/62 128/59 97/46  Pulse: 82 96 105 109  Temp: 98.2 F (36.8 C) 98.1 F (36.7 C) 99.3 F (37.4 C) 98 F (36.7 C)  TempSrc: Oral Oral Oral Axillary  Resp: Height:      Weight:      SpO2: 99% 98% 97% 98%    Intake/Output Summary (Last 24 hours) at 12/02/15 0938 Last data filed at 12/02/15 8119  Gross per 24 hour  Intake   2610 ml  Output   3100 ml  Net   -490 ml   Filed Weights   11/30/15 1330  Weight: 68.947 kg (152 lb)    Examination: General exam: Appears comfortable  HEENT: PERRLA, oral mucosa moist, no sclera icterus or thrush Respiratory system: Clear to auscultation. Respiratory effort normal. Cardiovascular system: S1 & S2 heard, RRR.  No murmurs  Gastrointestinal system: Abdomen soft, non-tender, nondistended. Normal bowel sound. No organomegaly Central nervous system: Alert and oriented. No focal neurological deficits. Extremities: No cyanosis, clubbing or edema Skin: No rashes or ulcers Psychiatry:  Mood & affect appropriate.     Data Reviewed: I have personally reviewed following labs and imaging studies  CBC:  Recent Labs Lab 11/30/15 1418 12/01/15 0423  WBC 12.2* 12.5*  NEUTROABS 9.2*  --  HGB 12.6 9.3*  HCT 36.7 27.2*  MCV 93.1 93.5  PLT 185 164   Basic Metabolic Panel:  Recent Labs Lab 11/30/15 1418 12/01/15 0423  NA 141 138  K 3.4* 4.1  CL 108 106  CO2 25 26  GLUCOSE 146* 193*  BUN 8 6  CREATININE 0.76 0.79  CALCIUM 9.1 8.0*   GFR: Estimated Creatinine Clearance: 51.7 mL/min (by C-G formula based on Cr of 0.79). Liver Function Tests: No results for input(s): AST, ALT, ALKPHOS, BILITOT, PROT, ALBUMIN in the last 168 hours. No results for input(s): LIPASE, AMYLASE in the last 168 hours. No results for input(s): AMMONIA in the last 168 hours. Coagulation Profile:  Recent Labs Lab  11/30/15 1418 12/01/15 0423  INR 1.17 1.27   Cardiac Enzymes: No results for input(s): CKTOTAL, CKMB, CKMBINDEX, TROPONINI in the last 168 hours. BNP (last 3 results) No results for input(s): PROBNP in the last 8760 hours. HbA1C: No results for input(s): HGBA1C in the last 72 hours. CBG: No results for input(s): GLUCAP in the last 168 hours. Lipid Profile: No results for input(s): CHOL, HDL, LDLCALC, TRIG, CHOLHDL, LDLDIRECT in the last 72 hours. Thyroid Function Tests: No results for input(s): TSH, T4TOTAL, FREET4, T3FREE, THYROIDAB in the last 72 hours. Anemia Panel: No results for input(s): VITAMINB12, FOLATE, FERRITIN, TIBC, IRON, RETICCTPCT in the last 72 hours. Urine analysis:    Component Value Date/Time   COLORURINE YELLOW 12/02/2015 0047   APPEARANCEUR CLEAR 12/02/2015 0047   LABSPEC 1.007 12/02/2015 0047   PHURINE 6.0 12/02/2015 0047   GLUCOSEU NEGATIVE 12/02/2015 0047   HGBUR NEGATIVE 12/02/2015 0047   BILIRUBINUR NEGATIVE 12/02/2015 0047   KETONESUR NEGATIVE 12/02/2015 0047   PROTEINUR NEGATIVE 12/02/2015 0047   UROBILINOGEN 0.2 01/25/2015 2330   NITRITE NEGATIVE 12/02/2015 0047   LEUKOCYTESUR NEGATIVE 12/02/2015 0047   Sepsis Labs: @LABRCNTIP (procalcitonin:4,lacticidven:4) )No results found for this or any previous visit (from the past 240 hour(s)).       Radiology Studies: Dg Chest 1 View  11/30/2015  CLINICAL DATA:  Fall, landed on RIGHT side.  RIGHT-sided pain EXAM: CHEST 1 VIEW COMPARISON:  09/24/2015 FINDINGS: Normal mediastinum and cardiac silhouette. Lungs are hyperinflated. No effusion, infiltrate pneumothorax. No evidence of fracture. Pulmonary is a skin fold over LEFT upper hemi thorax. IMPRESSION: No evidence of thoracic trauma Emphysematous change. Electronically Signed   By: Genevive BiStewart  Edmunds M.D.   On: 11/30/2015 14:22   Dg Lumbar Spine Complete  11/30/2015  CLINICAL DATA:  Fall today.  Landed on RIGHT side. EXAM: LUMBAR SPINE - COMPLETE 4+  VIEW COMPARISON:  None. FINDINGS: Normal alignment of lumbar vertebral bodies. No loss of vertebral body height or disc height. There is osteopenia noted. Multiple levels of endplate spurring. IMPRESSION: No acute findings lumbar spine. Electronically Signed   By: Genevive BiStewart  Edmunds M.D.   On: 11/30/2015 14:19   Dg Pelvis 1-2 Views  11/30/2015  CLINICAL DATA:  Fall with right hip pain EXAM: PELVIS - 1-2 VIEW COMPARISON:  None. FINDINGS: There is a comminuted intertrochanteric right proximal femur fracture with mild apex lateral angulation. No additional fracture in the pelvis. No diastasis or suspicious focal osseous lesion. No evidence of hip dislocation on this single frontal view. Mild degenerative changes in the visualized lower lumbar spine. IMPRESSION: Comminuted intertrochanteric right proximal femur fracture. Electronically Signed   By: Delbert PhenixJason A Poff M.D.   On: 11/30/2015 14:17   Dg C-arm 1-60 Min-no Report  11/30/2015  CLINICAL DATA: right hip IM nail C-ARM  1-60 MINUTES Fluoroscopy was utilized by the requesting physician.  No radiographic interpretation.   Dg Hip Operative Unilat W Or W/o Pelvis Right  11/30/2015  CLINICAL DATA:  Intraoperative intramedullary nail intertrochanteric right hip EXAM: OPERATIVE right HIP (WITH PELVIS IF PERFORMED) 5 VIEWS TECHNIQUE: Fluoroscopic spot image(s) were submitted for interpretation post-operatively. FLUOROSCOPY TIME:  1 minutes 12 seconds COMPARISON:  Right hip radiographs dated 11/30/2014 at 1344 seconds FINDINGS: Intraoperative fluoroscopic images during IM nail with dynamic hip screw fixation of an intertrochanteric right hip fracture. Fracture fragments are in near anatomic alignment. IMPRESSION: Intraoperative fluoroscopic images during ORIF of an intertrochanteric right hip fracture, as above. Electronically Signed   By: Charline Bills M.D.   On: 11/30/2015 18:51   Dg Femur, Min 2 Views Right  11/30/2015  CLINICAL DATA:  Fall.  Right hip pain.  EXAM: RIGHT FEMUR 2 VIEWS COMPARISON:  None. FINDINGS: There is a comminuted intertrochanteric right proximal femur fracture with mild apex lateral angulation, mild impaction and mild 6 mm medial displacement of the dominant distal fracture fragment. No additional fracture. No dislocation at the right hip or right knee. No suspicious focal osseous lesion. IMPRESSION: Comminuted intertrochanteric right proximal femur fracture as described. Electronically Signed   By: Delbert Phenix M.D.   On: 11/30/2015 14:19      Scheduled Meds: . diphenoxylate-atropine  5 mL Oral Once  . docusate sodium  100 mg Oral BID  . DULoxetine  20 mg Oral Daily  . feeding supplement (ENSURE ENLIVE)  237 mL Oral BID BM  . ketotifen  1 drop Both Eyes BID  . LORazepam      . nicotine  7 mg Transdermal Daily  . pantoprazole  40 mg Oral Daily  . potassium chloride  40 mEq Oral Once  . Warfarin - Pharmacist Dosing Inpatient   Does not apply q1800   Continuous Infusions:     LOS: 2 days    Time spent in minutes: 35    Mikayla Canady, MD [facility] Pager: [email or web address] Password [facility] 12/02/2015, 9:38 AM

## 2015-12-02 NOTE — Progress Notes (Signed)
Pt started trying to get OOB, trying to pull her IV out. Administered 2 mg Haldol per order.  Will continue to monitor.

## 2015-12-02 NOTE — Progress Notes (Signed)
Physical Therapy Treatment Patient Details Name: Mikayla LampJosephine M Gentile MRN: [id number] DOB: Dec 07, 1930 Today's Date: 12/02/2015    History of Present Illness 82 yo female adm 11/30/15 after fall resulting in righ hip fx, s/p ORIF right hip per Dr. Magnus IvanBlackman; PMHx:  RBBB, hiatal hernia, anxiety, depression, hyperlipidemia, GERD and asthma, +tobacco (dips snuff)     PT Comments    POD # 2 Pt required + 2 assist [facility] then amb in hallway.    Follow Up Recommendations  SNF  pt more agreeable after this session [facility] first choise if poss   Equipment Recommendations       Recommendations for Other Services       Precautions / Restrictions Precautions Precautions: Fall Precaution Comments: reports multiple recent falls at home lately Restrictions Weight Bearing Restrictions: No Other Position/Activity Restrictions: WBAT RLE    Mobility  Bed Mobility Overal bed mobility: Needs Assistance Bed Mobility: Supine to Sit     Supine to sit: Max assist;+2 for physical assistance;+2 for safety/equipment     General bed mobility comments: multi-modal cues for technique and self assist; requires assist with trunk, LEs, bed pad used to scoot to EOB  Transfers Overall transfer level: Needs assistance Equipment used: Rolling walker (2 wheeled) Transfers: Sit to/from Stand Sit to Stand: Min assist;+2 safety/equipment;+2 physical assistance         General transfer comment: cues for hand placement and overall safety  Ambulation/Gait Ambulation/Gait assistance: Min assist;Mod assist;+2 safety/equipment Ambulation Distance (Feet): 4 Feet Assistive device: Rolling walker (2 wheeled) Gait Pattern/deviations: Step-to pattern;Decreased stance time - right     General Gait Details: 75% VC's on proper sequencing and walker to self distance.  great difficulty tolerating WBing thru R LE.  Very unsteady gait.     Stairs            Wheelchair Mobility    Modified  Rankin (Stroke Patients Only)       Balance                                    Cognition Arousal/Alertness: Awake/alert Behavior During Therapy: Anxious                        Exercises      General Comments        Pertinent Vitals/Pain Pain Assessment: Faces Faces Pain Scale: Hurts even more Pain Location: R hip Pain Descriptors / Indicators: Grimacing Pain Intervention(s): Monitored during session;Repositioned;Ice applied    Home Living                      Prior Function            PT Goals (current goals can now be found in the care plan section) Progress towards PT goals: Progressing toward goals    Frequency  Min 5X/week    PT Plan Current plan remains appropriate    Co-evaluation             End of Session Equipment Utilized During Treatment: Gait belt Activity Tolerance: No increased pain;Patient limited by fatigue Patient left: in chair;with call bell/phone within reach;with chair alarm set     Time: [id number] PT Time Calculation (min) (ACUTE ONLY): 26 min  Charges:  $Gait Training: 8-22 mins $Therapeutic Activity: 8-22 mins  G Codes:      Rica Koyanagi  PTA [facility]  Acute  Rehab Pager      [phone]

## 2015-12-02 NOTE — Progress Notes (Signed)
Pt alert and oriented until about 30 minutes ago, when she started trying to get out of bed, saying "she wanted to get out of this bed and go home." As we were trying to keep her in bed, she started swing at staff with her fists and pinching us.  When she saw that we were not going to let her get out of bed, she started screaming at the top of her lungs, saying "she had a gun and was going to kill us."  I notified provider on call, obtained order to administer a one time dose of Ativan IV.  Will continue to monitor.

## 2015-12-03 ENCOUNTER — Inpatient Hospital Stay
Admission: RE | Admit: 2015-12-03 | Discharge: 2015-12-03 | Disposition: A | Discharge status: Home / Self Care | Payer: Medicare Other | Source: Ambulatory Visit | Attending: Physician Assistant | Admitting: Physician Assistant

## 2015-12-03 ENCOUNTER — Encounter (HOSPITAL_COMMUNITY): Payer: Self-pay | Admitting: Orthopaedic Surgery

## 2015-12-03 DIAGNOSIS — J45909 Unspecified asthma, uncomplicated: Secondary | ICD-10-CM | POA: Diagnosis not present

## 2015-12-03 DIAGNOSIS — M79604 Pain in right leg: Secondary | ICD-10-CM

## 2015-12-03 DIAGNOSIS — D62 Acute posthemorrhagic anemia: Secondary | ICD-10-CM | POA: Diagnosis not present

## 2015-12-03 DIAGNOSIS — Z720 Tobacco use: Secondary | ICD-10-CM

## 2015-12-03 DIAGNOSIS — M7989 Other specified soft tissue disorders: Secondary | ICD-10-CM | POA: Diagnosis not present

## 2015-12-03 DIAGNOSIS — K460 Unspecified abdominal hernia with obstruction, without gangrene: Secondary | ICD-10-CM | POA: Diagnosis not present

## 2015-12-03 DIAGNOSIS — S72001D Fracture of unspecified part of neck of right femur, subsequent encounter for closed fracture with routine healing: Secondary | ICD-10-CM | POA: Diagnosis not present

## 2015-12-03 DIAGNOSIS — K5900 Constipation, unspecified: Secondary | ICD-10-CM | POA: Diagnosis not present

## 2015-12-03 DIAGNOSIS — R52 Pain, unspecified: Secondary | ICD-10-CM

## 2015-12-03 DIAGNOSIS — R609 Edema, unspecified: Secondary | ICD-10-CM | POA: Diagnosis not present

## 2015-12-03 DIAGNOSIS — Z96641 Presence of right artificial hip joint: Secondary | ICD-10-CM | POA: Diagnosis not present

## 2015-12-03 DIAGNOSIS — K219 Gastro-esophageal reflux disease without esophagitis: Secondary | ICD-10-CM | POA: Diagnosis not present

## 2015-12-03 DIAGNOSIS — E785 Hyperlipidemia, unspecified: Secondary | ICD-10-CM | POA: Diagnosis not present

## 2015-12-03 DIAGNOSIS — J45998 Other asthma: Secondary | ICD-10-CM | POA: Diagnosis not present

## 2015-12-03 DIAGNOSIS — N390 Urinary tract infection, site not specified: Secondary | ICD-10-CM | POA: Diagnosis not present

## 2015-12-03 DIAGNOSIS — F419 Anxiety disorder, unspecified: Secondary | ICD-10-CM | POA: Diagnosis not present

## 2015-12-03 DIAGNOSIS — M79661 Pain in right lower leg: Principal | ICD-10-CM

## 2015-12-03 DIAGNOSIS — D508 Other iron deficiency anemias: Secondary | ICD-10-CM | POA: Diagnosis not present

## 2015-12-03 DIAGNOSIS — M6281 Muscle weakness (generalized): Secondary | ICD-10-CM | POA: Diagnosis not present

## 2015-12-03 DIAGNOSIS — H2510 Age-related nuclear cataract, unspecified eye: Secondary | ICD-10-CM | POA: Diagnosis not present

## 2015-12-03 DIAGNOSIS — W19XXXA Unspecified fall, initial encounter: Secondary | ICD-10-CM | POA: Diagnosis not present

## 2015-12-03 DIAGNOSIS — Z743 Need for continuous supervision: Secondary | ICD-10-CM | POA: Diagnosis not present

## 2015-12-03 DIAGNOSIS — S72009A Fracture of unspecified part of neck of unspecified femur, initial encounter for closed fracture: Secondary | ICD-10-CM | POA: Diagnosis not present

## 2015-12-03 DIAGNOSIS — F17200 Nicotine dependence, unspecified, uncomplicated: Secondary | ICD-10-CM

## 2015-12-03 DIAGNOSIS — F339 Major depressive disorder, recurrent, unspecified: Secondary | ICD-10-CM | POA: Diagnosis not present

## 2015-12-03 DIAGNOSIS — R410 Disorientation, unspecified: Secondary | ICD-10-CM | POA: Diagnosis not present

## 2015-12-03 DIAGNOSIS — R270 Ataxia, unspecified: Secondary | ICD-10-CM | POA: Diagnosis not present

## 2015-12-03 DIAGNOSIS — E784 Other hyperlipidemia: Secondary | ICD-10-CM | POA: Diagnosis not present

## 2015-12-03 DIAGNOSIS — Z8261 Family history of arthritis: Secondary | ICD-10-CM | POA: Diagnosis not present

## 2015-12-03 DIAGNOSIS — R791 Abnormal coagulation profile: Secondary | ICD-10-CM | POA: Diagnosis not present

## 2015-12-03 DIAGNOSIS — R279 Unspecified lack of coordination: Secondary | ICD-10-CM | POA: Diagnosis not present

## 2015-12-03 DIAGNOSIS — R2689 Other abnormalities of gait and mobility: Secondary | ICD-10-CM | POA: Diagnosis not present

## 2015-12-03 DIAGNOSIS — S72141D Displaced intertrochanteric fracture of right femur, subsequent encounter for closed fracture with routine healing: Secondary | ICD-10-CM | POA: Diagnosis not present

## 2015-12-03 DIAGNOSIS — J4520 Mild intermittent asthma, uncomplicated: Secondary | ICD-10-CM | POA: Diagnosis not present

## 2015-12-03 DIAGNOSIS — M818 Other osteoporosis without current pathological fracture: Secondary | ICD-10-CM | POA: Diagnosis not present

## 2015-12-03 DIAGNOSIS — R531 Weakness: Secondary | ICD-10-CM | POA: Diagnosis not present

## 2015-12-03 DIAGNOSIS — D6489 Other specified anemias: Secondary | ICD-10-CM | POA: Diagnosis not present

## 2015-12-03 DIAGNOSIS — W19XXXD Unspecified fall, subsequent encounter: Secondary | ICD-10-CM | POA: Diagnosis not present

## 2015-12-03 LAB — CBC
HCT: 21.5 % — ABNORMAL LOW (ref 36.0–46.0)
Hemoglobin: 7.4 g/dL — ABNORMAL LOW (ref 12.0–15.0)
MCH: 32.3 pg (ref 26.0–34.0)
MCHC: 34.4 g/dL (ref 30.0–36.0)
MCV: 93.9 fL (ref 78.0–100.0)
PLATELETS: 145 10*3/uL — AB (ref 150–400)
RBC: 2.29 MIL/uL — AB (ref 3.87–5.11)
RDW: 13.5 % (ref 11.5–15.5)
WBC: 11.9 10*3/uL — ABNORMAL HIGH (ref 4.0–10.5)

## 2015-12-03 LAB — PROTIME-INR
INR: 1.5 — ABNORMAL HIGH (ref 0.00–1.49)
PROTHROMBIN TIME: 17.6 s — AB (ref 11.6–15.2)

## 2015-12-03 LAB — BASIC METABOLIC PANEL
ANION GAP: 4 — AB (ref 5–15)
BUN: 10 mg/dL (ref 6–20)
CHLORIDE: 107 mmol/L (ref 101–111)
CO2: 28 mmol/L (ref 22–32)
CREATININE: 0.55 mg/dL (ref 0.44–1.00)
Calcium: 8.2 mg/dL — ABNORMAL LOW (ref 8.9–10.3)
GFR calc non Af Amer: >60 mL/min (ref >60)
GLUCOSE: 112 mg/dL — AB (ref 65–99)
Potassium: 3.7 mmol/L (ref 3.5–5.1)
Sodium: 139 mmol/L (ref 135–145)

## 2015-12-03 LAB — HEMOGLOBIN AND HEMATOCRIT, BLOOD
HCT: 26.6 % — ABNORMAL LOW (ref 36.0–46.0)
HEMOGLOBIN: 9 g/dL — AB (ref 12.0–15.0)

## 2015-12-03 MED ORDER — ALUM & MAG HYDROXIDE-SIMETH 200-200-20 MG/5ML PO SUSP: 30.0000 mL | ORAL | Status: DC | PRN

## 2015-12-03 MED ORDER — DOCUSATE SODIUM 100 MG PO CAPS: 100.0000 mg | ORAL_CAPSULE | Freq: Two times a day (BID) | ORAL | Status: DC

## 2015-12-03 MED ORDER — SODIUM CHLORIDE 0.9 % IV SOLN
Freq: Once | INTRAVENOUS | Status: AC
  Administered 2015-12-03: 10:00:00 via INTRAVENOUS

## 2015-12-03 MED ORDER — BISACODYL 10 MG RE SUPP
10.0000 mg | Freq: Once | RECTAL | Status: AC
  Administered 2015-12-03: 10 mg via RECTAL
  Filled 2015-12-03: qty 1

## 2015-12-03 MED ORDER — WARFARIN SODIUM 4 MG PO TABS: 4.0000 mg | ORAL_TABLET | Freq: Every day | ORAL | Status: DC

## 2015-12-03 MED ORDER — WARFARIN SODIUM 6 MG PO TABS
6.0000 mg | ORAL_TABLET | Freq: Once | ORAL | Status: AC
  Administered 2015-12-03: 6 mg via ORAL
  Filled 2015-12-03: qty 1

## 2015-12-03 MED ORDER — LORAZEPAM 1 MG PO TABS: 1.0000 mg | ORAL_TABLET | Freq: Two times a day (BID) | ORAL | Status: DC

## 2015-12-03 MED ORDER — ACETAMINOPHEN 325 MG PO TABS: 650.0000 mg | ORAL_TABLET | Freq: Four times a day (QID) | ORAL | Status: DC | PRN

## 2015-12-03 NOTE — Progress Notes (Signed)
CSW met with pt / family at bedside to assist with d/c planning. Pt is in agreement with [facility] placement. SNF initiated and bed offers are pending. Pt / family are requesting [facility] Awaiting return call from [facility].  Werner Lean LCSW [phone]

## 2015-12-03 NOTE — Care Management Important Message (Signed)
Important Message  Patient Details  Name: Mikayla Wilson MRN: [id number] Date of Birth: 05/24/1931   Medicare Important Message Given:  Yes    Haskell FlirtJamison, Nikki Glanzer 12/03/2015, 10:46 AMImportant Message  Patient Details  Name: Mikayla Wilson MRN: [id number] Date of Birth: 05/24/1931   Medicare Important Message Given:  Yes    Haskell FlirtJamison, Keyontay Stolz 12/03/2015, 10:46 AM

## 2015-12-03 NOTE — Clinical Social Work Placement (Signed)
   CLINICAL SOCIAL WORK PLACEMENT  NOTE  Date:  12/03/2015  Patient Details  Name: Mikayla Wilson MRN: [id number] Date of Birth: 05/02/1931  Clinical Social Work is seeking post-discharge placement for this patient at the Skilled  Nursing Facility level of care (*CSW will initial, date and re-position this form in  chart as items are completed):  Yes   Patient/family provided with [facility] Clinical Social Work Department's list of facilities offering this level of care within the geographic area requested by the patient (or if unable, by the patient's family).  Yes   Patient/family informed of their freedom to choose among providers that offer the needed level of care, that participate in Medicare, Medicaid or managed care program needed by the patient, have an available bed and are willing to accept the patient.  Yes   Patient/family informed of [facility]'s ownership interest in [facility] and [facility], as well as of the fact that they are under no obligation to receive care at these facilities.  PASRR submitted to EDS on 12/03/15     PASRR number received on 12/03/15     Existing PASRR number confirmed on       FL2 transmitted to all facilities in geographic area requested by pt/family on 12/03/15     FL2 transmitted to all facilities within larger geographic area on       Patient informed that his/her managed care company has contracts with or will negotiate with certain facilities, including the following:        Yes   Patient/family informed of bed offers received.  [facility]     Physician recommends and patient chooses bed at      [facility]26/[facility].  Patient to be transferred to facility by [facility]     Patient family notified on 12/03/15 of transfer.  Name of family member notified:  Daughter     PHYSICIAN       Additional Comment: Pt / family are in agreement with d/[facility] to  [facility]. [facility] transport required. Pt / family are aware out of pocket costs may be associated with [facility] transport. Medical necessity form completed. D/C Summary sent to SNF for review prior to d/c. Medication questions answered by nsg. Scripts included in d/c packet. # for report provided to nsg.   _______________________________________________ Royetta AsalHaidinger, Seren Chaloux Lee, LCSW  [phone] 12/03/2015, 4:24 PM

## 2015-12-03 NOTE — Progress Notes (Signed)
Subjective: 3 Days Post-Op Procedure(s) (LRB): INTRAMEDULLARY (IM) NAIL INTERTROCHANTERIC RIGHT HIP (Right) Patient reports pain as moderate.  Slow mobility and therapy recommending skilled nursing.  Acute blood loss anemia; may need transfusion   Objective: Vital signs in last 24 hours: Temp:  [98.7 F (37.1 C)-99.2 F (37.3 C)] 99.2 F (37.3 C) (05/22 0535) Pulse Rate:  [97-107] 97 (05/22 0535) Resp:  [16-18] 16 (05/22 0535) BP: (101-133)/(55-59) 101/55 mmHg (05/22 0535) SpO2:  [98 %-99 %] 98 % (05/22 0535)  Intake/Output from previous day: 05/21 0701 - 05/22 0700 In: 1080 [P.O.:1080] Out: 1600 [Urine:1600] Intake/Output this shift:     Recent Labs  11/30/15 1418 12/01/15 0423 12/02/15 0954 12/03/15 0424  HGB 12.6 9.3* 8.1* 7.4*    Recent Labs  12/02/15 0954 12/03/15 0424  WBC 15.0* 11.9*  RBC 2.53* 2.29*  HCT 23.7* 21.5*  PLT 175 145*    Recent Labs  12/02/15 0954 12/03/15 0424  NA 140 139  K 3.2* 3.7  CL 107 107  CO2 28 28  BUN 11 10  CREATININE 0.64 0.55  GLUCOSE 158* 112*  CALCIUM 8.5* 8.2*    Recent Labs  12/02/15 0954 12/03/15 0424  INR 1.51* 1.50*    Sensation intact distally Intact pulses distally Dorsiflexion/Plantar flexion intact Incision: scant drainage  Assessment/Plan: 3 Days Post-Op Procedure(s) (LRB): INTRAMEDULLARY (IM) NAIL INTERTROCHANTERIC RIGHT HIP (Right) Up with therapy Discharge to SNF soon per therapy  Brailyn Delman Y 12/03/2015, 7:24 AM

## 2015-12-03 NOTE — Discharge Summary (Signed)
Physician Discharge Summary  Mikayla Wilson:[id number] DOB: 1931/01/06 DOA: 11/30/2015  PCP: Kevin Fenton, MD  Admit date: 11/30/2015 Discharge date: 12/03/2015  Time spent: 55 minutes  Recommendations for Outpatient Follow-up:  1. CBCin 2 days  2. Coumadin for DVT prophylaxis SNF pharmacy to manage  Discharge Condition: stable    Discharge Diagnoses:  Principal Problem:   Hip fracture requiring operative repair (HCC) Active Problems:   Acute blood loss anemia   Anxiety   HLD (hyperlipidemia)   GERD (gastroesophageal reflux disease)   Asthma   Smoker sundowning   History of present illness:  Mikayla Wilson is a 82 y.o. female with medical history significant of RBBB, hiatal hernia, anxiety, hyperlipidemia, GERD and asthma who presents with a right intertronchanteric femur fracture while doing yard work and falling. She has no complaints of chest pain or dyspnea on exertion. She has been having 2-3 BMs a day (not all are loose) for the past 2-3 weeks. She states it started after Prednisone was started for respiratory issues.   Hospital Course:  Principal Problem:  Right Hip fracture requiring operative repair  - s/p IM nailing on 5/19- Coumadin for DVT prophylaxis per ortho- has received 4 mg daily for the past 3 days- INR 1.5 today - PT recommends SNF   - has been using Robaxin and Tylenol for discomfort  Active Problems:  Anemia acute blood loss - Hb dropped from 12.6 to 7.4 likely due to the fracture- receiving 1 u PRBC today  Sundowning -  she does well when her daughter spends the night smoker - if she does become agitation, can try Seroquel 20 mg QHS which I will order  Anxiety - Ativan 1mg  BID - Cymbalta   HLD (hyperlipidemia) - Rosuvastatin  Diarrhea - NO diarrhea in the hospital    GERD (gastroesophageal reflux disease) - Prilosec- states this did not cause her diarrhea and she needs it   Asthma - Xopenex PRN  RBBB - per history  and EKG  Smoker - refusing Nicotine patch  Procedures:  5/20 IM nailing right hip  Consultations:  ortho  Discharge Exam: Filed Weights   11/30/15 1330  Weight: 68.947 kg (152 lb)   Filed Vitals:   12/03/15 1018 12/03/15 1033  BP: 145/73 128/70  Pulse: 113 106  Temp: 98.5 F (36.9 C) 98.4 F (36.9 C)  Resp: 16 51    General: AAO x 3, no distress Cardiovascular: RRR, no murmurs  Respiratory: clear to auscultation bilaterally GI: soft, non-tender, non-distended, bowel sound positive  Discharge Instructions You were cared for by a hospitalist during your hospital stay. If you have any questions about your discharge medications or the care you received while you were in the hospital after you are discharged, you can call the unit and asked to speak with the hospitalist on call if the hospitalist that took care of you is not available. Once you are discharged, your primary care physician will handle any further medical issues. Please note that NO REFILLS for any discharge medications will be authorized once you are discharged, as it is imperative that you return to your primary care physician (or establish a relationship with a primary care physician if you do not have one) for your aftercare needs so that they can reassess your need for medications and monitor your lab values.      Discharge Instructions    Discharge instructions    Complete by:  As directed   Regular diet  Full weight bearing    Complete by:  As directed      Increase activity slowly    Complete by:  As directed             Medication List    TAKE these medications        acetaminophen 325 MG tablet  Commonly known as:  TYLENOL  Take 2 tablets (650 mg total) by mouth every 6 (six) hours as needed for mild pain (or Fever >/= 101).     alum & mag hydroxide-simeth 200-200-20 MG/5ML suspension  Commonly known as:  MAALOX/MYLANTA  Take 30 mLs by mouth every 4 (four) hours as needed for  indigestion.     COUGH SYRUP PO  Take 15 mLs by mouth daily as needed (cough).     docusate sodium 100 MG capsule  Commonly known as:  COLACE  Take 1 capsule (100 mg total) by mouth 2 (two) times daily.     DULoxetine 20 MG capsule  Commonly known as:  CYMBALTA  Take 1 capsule (20 mg total) by mouth daily.     ENSURE NUTRITION SHAKE PO  Take 1-2 Bottles by mouth daily. Reported on 11/30/2015     ketotifen 0.025 % ophthalmic solution  Commonly known as:  ZADITOR  Place 1 drop into both eyes 6 (six) times daily.     LORATADINE PO  Take 1 tablet by mouth daily as needed (allergies, runny nose).     LORazepam 1 MG tablet  Commonly known as:  ATIVAN  Take 1 tablet (1 mg total) by mouth 2 (two) times daily.     methocarbamol 500 MG tablet  Commonly known as:  ROBAXIN  Take 1 tablet (500 mg total) by mouth every 6 (six) hours as needed for muscle spasms.     omeprazole 20 MG capsule  Commonly known as:  PRILOSEC  Take 1 capsule (20 mg total) by mouth daily.     rosuvastatin 5 MG tablet  Commonly known as:  CRESTOR  Take 5 mg by mouth daily.     SALINE NASAL MIST NA  Place 1 spray into the nose 2 (two) times daily.     VISINE-A OP  Apply 1-2 drops to eye 2 (two) times daily as needed (allergies).     VITAMIN D PO  Take 1 tablet by mouth daily.     XOPENEX HFA 45 MCG/ACT inhaler  Generic drug:  levalbuterol  Inhale 2 puffs into the lungs every 8 (eight) hours as needed for wheezing or shortness of breath.       Allergies  Allergen Reactions  . Aspirin Other (See Comments)    "My doctor told me to never take aspirin"  . Morphine And Related Other (See Comments)   Follow-up Information    Follow up with Kathryne Hitch, MD. Schedule an appointment as soon as possible for a visit in 2 weeks.   Specialty:  Orthopedic Surgery   Contact information:   [address] [place] [address] [phone]        The results of significant diagnostics  from this hospitalization (including imaging, microbiology, ancillary and laboratory) are listed below for reference.    Significant Diagnostic Studies: Dg Chest 1 View  11/30/2015  CLINICAL DATA:  Fall, landed on RIGHT side.  RIGHT-sided pain EXAM: CHEST 1 VIEW COMPARISON:  09/24/2015 FINDINGS: Normal mediastinum and cardiac silhouette. Lungs are hyperinflated. No effusion, infiltrate pneumothorax. No evidence of fracture. Pulmonary is a skin fold over  LEFT upper hemi thorax. IMPRESSION: No evidence of thoracic trauma Emphysematous change. Electronically Signed   By: Genevive Bi M.D.   On: 11/30/2015 14:22   Dg Lumbar Spine Complete  11/30/2015  CLINICAL DATA:  Fall today.  Landed on RIGHT side. EXAM: LUMBAR SPINE - COMPLETE 4+ VIEW COMPARISON:  None. FINDINGS: Normal alignment of lumbar vertebral bodies. No loss of vertebral body height or disc height. There is osteopenia noted. Multiple levels of endplate spurring. IMPRESSION: No acute findings lumbar spine. Electronically Signed   By: Genevive Bi M.D.   On: 11/30/2015 14:19   Dg Pelvis 1-2 Views  11/30/2015  CLINICAL DATA:  Fall with right hip pain EXAM: PELVIS - 1-2 VIEW COMPARISON:  None. FINDINGS: There is a comminuted intertrochanteric right proximal femur fracture with mild apex lateral angulation. No additional fracture in the pelvis. No diastasis or suspicious focal osseous lesion. No evidence of hip dislocation on this single frontal view. Mild degenerative changes in the visualized lower lumbar spine. IMPRESSION: Comminuted intertrochanteric right proximal femur fracture. Electronically Signed   By: Delbert Phenix M.D.   On: 11/30/2015 14:17   Dg C-arm 1-60 Min-no Report  11/30/2015  CLINICAL DATA: right hip IM nail C-ARM 1-60 MINUTES Fluoroscopy was utilized by the requesting physician.  No radiographic interpretation.   Dg Hip Operative Unilat W Or W/o Pelvis Right  11/30/2015  CLINICAL DATA:  Intraoperative intramedullary nail  intertrochanteric right hip EXAM: OPERATIVE right HIP (WITH PELVIS IF PERFORMED) 5 VIEWS TECHNIQUE: Fluoroscopic spot image(s) were submitted for interpretation post-operatively. FLUOROSCOPY TIME:  1 minutes 12 seconds COMPARISON:  Right hip radiographs dated 11/30/2014 at 1344 seconds FINDINGS: Intraoperative fluoroscopic images during IM nail with dynamic hip screw fixation of an intertrochanteric right hip fracture. Fracture fragments are in near anatomic alignment. IMPRESSION: Intraoperative fluoroscopic images during ORIF of an intertrochanteric right hip fracture, as above. Electronically Signed   By: Charline Bills M.D.   On: 11/30/2015 18:51   Dg Femur, Min 2 Views Right  11/30/2015  CLINICAL DATA:  Fall.  Right hip pain. EXAM: RIGHT FEMUR 2 VIEWS COMPARISON:  None. FINDINGS: There is a comminuted intertrochanteric right proximal femur fracture with mild apex lateral angulation, mild impaction and mild 6 mm medial displacement of the dominant distal fracture fragment. No additional fracture. No dislocation at the right hip or right knee. No suspicious focal osseous lesion. IMPRESSION: Comminuted intertrochanteric right proximal femur fracture as described. Electronically Signed   By: Delbert Phenix M.D.   On: 11/30/2015 14:19    Microbiology: No results found for this or any previous visit (from the past 240 hour(s)).   Labs: Basic Metabolic Panel:  Recent Labs Lab 11/30/15 1418 12/01/15 0423 12/02/15 0954 12/03/15 0424  NA 141 138 140 139  K 3.4* 4.1 3.2* 3.7  CL 108 106 107 107  CO2 GLUCOSE 146* 193* 158* 112*  BUN CREATININE 0.76 0.79 0.64 0.55  CALCIUM 9.1 8.0* 8.5* 8.2*   Liver Function Tests: No results for input(s): AST, ALT, ALKPHOS, BILITOT, PROT, ALBUMIN in the last 168 hours. No results for input(s): LIPASE, AMYLASE in the last 168 hours. No results for input(s): AMMONIA in the last 168 hours. CBC:  Recent Labs Lab 11/30/15 1418  12/01/15 0423 12/02/15 0954 12/03/15 0424  WBC 12.2* 12.5* 15.0* 11.9*  NEUTROABS 9.2*  --   --   --   HGB 12.6 9.3* 8.1* 7.4*  HCT 36.7 27.2* 23.7*  21.5*  MCV 93.1 93.5 93.7 93.9  PLT 185 164 175 145*   Cardiac Enzymes: No results for input(s): CKTOTAL, CKMB, CKMBINDEX, TROPONINI in the last 168 hours. BNP: BNP (last 3 results) No results for input(s): BNP in the last 8760 hours.  ProBNP (last 3 results) No results for input(s): PROBNP in the last 8760 hours.  CBG: No results for input(s): GLUCAP in the last 168 hours.     SignedCalvert Cantor:  Miliani Deike, MD [facility] 12/03/2015, 1:05 PM

## 2015-12-03 NOTE — Progress Notes (Signed)
ANTICOAGULATION CONSULT NOTE - Follow-Up  Pharmacy Consult for Warfarin Indication: VTE prophylaxis  Allergies  Allergen Reactions  . Aspirin Other (See Comments)    "My doctor told me to never take aspirin"  . Morphine And Related Other (See Comments)   Patient Measurements: Height: 5\' 4"  (162.6 cm) Weight: 152 lb (68.947 kg) IBW/kg (Calculated) : 54.7  Vital Signs: Temp: 98.4 F (36.9 C) (05/22 0919) Temp Source: Oral (05/22 0919) BP: 126/60 mmHg (05/22 0919) Pulse Rate: 101 (05/22 0919)  Labs:  Recent Labs  12/01/15 0423 12/02/15 0954 12/03/15 0424  HGB 9.3* 8.1* 7.4*  HCT 27.2* 23.7* 21.5*  PLT 164 175 145*  LABPROT 15.6* 17.7* 17.6*  INR 1.27 1.51* 1.50*  CREATININE 0.79 0.64 0.55   Estimated Creatinine Clearance: 51.7 mL/min (by C-G formula based on Cr of 0.55).  Assessment: Mikayla Wilson s/p Hip fx repair, Warfarin for VTE prophylaxis. Baseline INR wnl, no anti-coagulants PTA.  Today, 12/03/2015   INR 1.50, subtherapeutic and did not increase further from yesterday's INR. Patient has been receiving 4 mg doses.  Hgb decreasing.  ABLA, 1uPRBC ordered for today.  Patient education completed on 5/20  Poor appetite 0-25% meals eaten with BID Ensures.  Goal of Therapy:  INR 2-3  Plan:   Warfarin 6mg  tonight  Daily INR.  Clance BollAmanda Rehan Holness, PharmD, BCPS Pager: [phone] 12/03/2015 10:01 AM

## 2015-12-03 NOTE — NC FL2 (Signed)
[place] MEDICAID FL2 LEVEL OF CARE SCREENING TOOL     IDENTIFICATION  Patient Name: Mikayla Wilson Birthdate: [birth year] Sex: female Admission Date (Current Location): 11/30/2015  [facility] and [place] Number:  Geophysical data processor and Address:  [facility],  [address] [place] [address]      Provider Number: [id number]  Attending Physician Name and Address:  Calvert Cantor, MD  Relative Name and Phone Number:       Current Level of Care: Hospital Recommended Level of Care: Skilled Nursing Facility Prior Approval Number:    Date Approved/Denied:   PASRR Number:    Discharge Plan: SNF    Current Diagnoses: Patient Active Problem List   Diagnosis Date Noted  . Hip fracture requiring operative repair (HCC) 11/30/2015  . Asthma 09/24/2015  . HLD (hyperlipidemia) 08/27/2015  . GERD (gastroesophageal reflux disease) 08/27/2015  . Anxiety 01/26/2015    Orientation RESPIRATION BLADDER Height & Weight     Self, Time, Situation, Place  Normal Indwelling catheter Weight: 68.947 kg (152 lb) Height:   (162.6 cm)  BEHAVIORAL SYMPTOMS/MOOD NEUROLOGICAL BOWEL NUTRITION STATUS  Other (Comment) (no behaviors)   Continent Diet  AMBULATORY STATUS COMMUNICATION OF NEEDS Skin   Extensive Assist Verbally Surgical wounds                       Personal Care Assistance Level of Assistance  Bathing, Feeding, Dressing Bathing Assistance: Limited assistance Feeding assistance: Independent Dressing Assistance: Limited assistance     Functional Limitations Info  Sight, Hearing, Speech Sight Info: Adequate Hearing Info: Adequate Speech Info: Adequate    SPECIAL CARE FACTORS FREQUENCY  PT (By licensed PT), OT (By licensed OT)     PT Frequency: 5 x wk OT Frequency: 5 x wk            Contractures Contractures Info: Not present    Additional Factors Info  Code Status Code Status Info: Full Code             Current Medications  (12/03/2015):  This is the current hospital active medication list Current Facility-Administered Medications  Medication Dose Route Frequency Provider Last Rate Last Dose  . 0.9 %  sodium chloride infusion   Intravenous Once Calvert Cantor, MD      . acetaminophen (TYLENOL) tablet 650 mg  650 mg Oral Q6H PRN Kathryne Hitch, MD   650 mg at 12/01/15 2301   Or  . acetaminophen (TYLENOL) suppository 650 mg  650 mg Rectal Q6H PRN Kathryne Hitch, MD      . albuterol (PROVENTIL) (2.5 MG/3ML) 0.083% nebulizer solution 2 mL  2 mL Inhalation Q8H PRN Calvert Cantor, MD      . alum & mag hydroxide-simeth (MAALOX/MYLANTA) 200-200-20 MG/5ML suspension 30 mL  30 mL Oral Q4H PRN Kathryne Hitch, MD      . diphenoxylate-atropine (LOMOTIL) 2.5-0.025 MG/5ML liquid 5 mL  5 mL Oral Once Calvert Cantor, MD      . docusate sodium (COLACE) capsule 100 mg  100 mg Oral BID Kathryne Hitch, MD   100 mg at 12/02/15 2102  . DULoxetine (CYMBALTA) DR capsule 20 mg  20 mg Oral Daily Calvert Cantor, MD   20 mg at 12/02/15 1020  . feeding supplement (ENSURE ENLIVE) (ENSURE ENLIVE) liquid 237 mL  237 mL Oral BID BM Calvert Cantor, MD   237 mL at 12/02/15 1817  . HYDROmorphone (DILAUDID) injection 0.5 mg  0.5 mg  Intravenous Q2H PRN Kathryne Hitchhristopher Y Blackman, MD      . ketotifen (ZADITOR) 0.025 % ophthalmic solution 1 drop  1 drop Both Eyes BID Calvert CantorSaima Rizwan, MD      . LORazepam (ATIVAN) tablet 1 mg  1 mg Oral Q12H PRN Calvert CantorSaima Rizwan, MD   1 mg at 12/02/15 2105  . menthol-cetylpyridinium (CEPACOL) lozenge 3 mg  1 lozenge Oral PRN Kathryne Hitchhristopher Y Blackman, MD       Or  . phenol (CHLORASEPTIC) mouth spray 1 spray  1 spray Mouth/Throat PRN Kathryne Hitchhristopher Y Blackman, MD      . methocarbamol (ROBAXIN) tablet 500 mg  500 mg Oral Q6H PRN Kathryne Hitchhristopher Y Blackman, MD   500 mg at 12/03/15 0541   Or  . methocarbamol (ROBAXIN) 500 mg in dextrose 5 % 50 mL IVPB  500 mg Intravenous Q6H PRN Kathryne Hitchhristopher Y Blackman, MD   500 mg at 12/01/15  0259  . nicotine (NICODERM CQ - dosed in mg/24 hr) patch 7 mg  7 mg Transdermal Daily Lonia SkinnerLeslie K Sofia, PA-C   7 mg at 12/02/15 1000  . ondansetron (ZOFRAN) tablet 4 mg  4 mg Oral Q6H PRN Kathryne Hitchhristopher Y Blackman, MD   4 mg at 12/03/15 [id number]   Or  . ondansetron [facility](ZOFRAN) injection 4 mg  4 mg Intravenous Q6H PRN Kathryne Hitchhristopher Y Blackman, MD      . pantoprazole (PROTONIX) EC tablet 40 mg  40 mg Oral Daily Calvert CantorSaima Rizwan, MD   40 mg at 12/02/15 1020  . potassium chloride SA (K-DUR,KLOR-CON) CR tablet 40 mEq  40 mEq Oral Once Calvert CantorSaima Rizwan, MD      . warfarin (COUMADIN) tablet 6 mg  6 mg Oral ONCE-1800 Maryanna ShapeAmanda M Runyon, RPH      . Warfarin - Pharmacist Dosing Inpatient   Does not apply q1800 Otho Bellowserri L Green, RPH   0  at 12/01/15 1800     Discharge Medications: Please see discharge summary for a list of discharge medications.  Relevant Imaging Results:  Relevant Lab Results:   Additional Information SS # [id number]  Latica Hohmann, Dickey GaveJamie Lee, LCSW

## 2015-12-03 NOTE — Progress Notes (Signed)
Physical Therapy Treatment Patient Details Name: Mikayla LampJosephine M Seeley MRN: [id number] DOB: October 13, 1930 Today's Date: 12/03/2015    History of Present Illness 82 yo female adm 11/30/15 after fall resulting in righ hip fx, s/p ORIF right hip per Dr. Magnus IvanBlackman; PMHx:  RBBB, hiatal hernia, anxiety, depression, hyperlipidemia, GERD and asthma, +tobacco (dips snuff)     PT Comments    POD # 3  Completed blood transfusion.  [facility] with increased time and assist.  Follow Up Recommendations  SNF     Equipment Recommendations       Recommendations for Other Services       Precautions / Restrictions Precautions Precautions: Fall Restrictions Weight Bearing Restrictions: No Other Position/Activity Restrictions: WBAT RLE    Mobility  Bed Mobility Overal bed mobility: Needs Assistance Bed Mobility: Supine to Sit;Sit to Supine     Supine to sit: Mod assist Sit to supine: Mod assist;Max assist   General bed mobility comments: assisted OOB then back into bed  Transfers Overall transfer level: Needs assistance Equipment used: Rolling walker (2 wheeled) Transfers: Sit to/from [employer] CorporationStand;Stand Pivot Transfers Sit to Stand: Min assist;+2 safety/equipment;+2 physical assistance         General transfer comment: assisted on then off BSC with 50% VC's on proper hand placement and 75% VC's on turn completion.  Ambulation/Gait                 Stairs            Wheelchair Mobility    Modified Rankin (Stroke Patients Only)       Balance                                    Cognition Arousal/Alertness: Awake/alert                          Exercises      General Comments        Pertinent Vitals/Pain Pain Assessment: Faces Faces Pain Scale: Hurts little more Pain Location: R hip Pain Descriptors / Indicators: Grimacing;Moaning Pain Intervention(s): Monitored during session;Repositioned;Ice applied    Home  Living                      Prior Function            PT Goals (current goals can now be found in the care plan section) Progress towards PT goals: Progressing toward goals    Frequency       PT Plan Current plan remains appropriate    Co-evaluation             End of Session Equipment Utilized During Treatment: Gait belt Activity Tolerance: Patient tolerated treatment well Patient left: in bed;with call bell/phone within reach     Time: 1434-1458 PT Time Calculation (min) (ACUTE ONLY): 24 min  Charges:  $Gait Training: 8-22 mins $Therapeutic Activity: 8-22 mins                    G Codes:      Felecia ShellingLori Sanel Stemmer  PTA [facility]  Acute  Rehab Pager      [phone]

## 2015-12-03 NOTE — Progress Notes (Signed)
RN called report to Production managerVivian RN at [facility]. All questions answered.   Paperwork and prescriptions sent with [facility] transport.  Patient transported via [facility] to SNF facility.

## 2015-12-04 ENCOUNTER — Other Ambulatory Visit: Payer: Self-pay

## 2015-12-04 DIAGNOSIS — K219 Gastro-esophageal reflux disease without esophagitis: Secondary | ICD-10-CM | POA: Diagnosis not present

## 2015-12-04 DIAGNOSIS — Z96641 Presence of right artificial hip joint: Secondary | ICD-10-CM | POA: Diagnosis not present

## 2015-12-04 DIAGNOSIS — E784 Other hyperlipidemia: Secondary | ICD-10-CM | POA: Diagnosis not present

## 2015-12-04 DIAGNOSIS — J45998 Other asthma: Secondary | ICD-10-CM | POA: Diagnosis not present

## 2015-12-04 LAB — TYPE AND SCREEN
ABO/RH(D): O NEG
Antibody Screen: NEGATIVE
UNIT DIVISION: 0

## 2015-12-04 MED ORDER — LORAZEPAM 1 MG PO TABS: 1.0000 mg | ORAL_TABLET | Freq: Two times a day (BID) | ORAL | Status: DC

## 2015-12-04 NOTE — Telephone Encounter (Signed)
Rx faxed to [facility] at [phone].   Phone #: [phone]  

## 2015-12-17 ENCOUNTER — Encounter (HOSPITAL_COMMUNITY): Payer: Self-pay

## 2015-12-17 ENCOUNTER — Ambulatory Visit (HOSPITAL_COMMUNITY)
Admission: RE | Admit: 2015-12-17 | Discharge: 2015-12-17 | Disposition: A | Discharge status: Home / Self Care | Payer: No Typology Code available for payment source | Source: Ambulatory Visit | Attending: Physician Assistant | Admitting: Physician Assistant

## 2015-12-17 DIAGNOSIS — M7989 Other specified soft tissue disorders: Secondary | ICD-10-CM | POA: Diagnosis not present

## 2015-12-17 DIAGNOSIS — R609 Edema, unspecified: Secondary | ICD-10-CM | POA: Diagnosis not present

## 2015-12-17 DIAGNOSIS — R52 Pain, unspecified: Secondary | ICD-10-CM | POA: Diagnosis not present

## 2015-12-20 ENCOUNTER — Other Ambulatory Visit: Payer: Self-pay | Admitting: *Deleted

## 2015-12-20 MED ORDER — METOPROLOL TARTRATE 25 MG PO TABS: 25.0000 mg | ORAL_TABLET | Freq: Two times a day (BID) | ORAL | Status: DC

## 2015-12-20 MED ORDER — WARFARIN SODIUM 2 MG PO TABS
ORAL_TABLET | ORAL | Status: DC
Start: 2015-12-20 — End: 2015-12-28

## 2015-12-20 MED ORDER — WARFARIN SODIUM 5 MG PO TABS: 5.0000 mg | ORAL_TABLET | Freq: Every day | ORAL | Status: DC

## 2015-12-20 MED ORDER — METHOCARBAMOL 500 MG PO TABS: 500.0000 mg | ORAL_TABLET | Freq: Four times a day (QID) | ORAL | Status: DC | PRN

## 2015-12-20 NOTE — Telephone Encounter (Signed)
[facility] and said will be discharged tomorrow. She needs new RX's for the following meds?

## 2015-12-22 DIAGNOSIS — J4520 Mild intermittent asthma, uncomplicated: Secondary | ICD-10-CM | POA: Diagnosis not present

## 2015-12-22 DIAGNOSIS — S72141D Displaced intertrochanteric fracture of right femur, subsequent encounter for closed fracture with routine healing: Secondary | ICD-10-CM | POA: Diagnosis not present

## 2015-12-24 DIAGNOSIS — J4520 Mild intermittent asthma, uncomplicated: Secondary | ICD-10-CM | POA: Diagnosis not present

## 2015-12-24 DIAGNOSIS — S72141D Displaced intertrochanteric fracture of right femur, subsequent encounter for closed fracture with routine healing: Secondary | ICD-10-CM | POA: Diagnosis not present

## 2015-12-26 DIAGNOSIS — S72141D Displaced intertrochanteric fracture of right femur, subsequent encounter for closed fracture with routine healing: Secondary | ICD-10-CM | POA: Diagnosis not present

## 2015-12-26 DIAGNOSIS — J4520 Mild intermittent asthma, uncomplicated: Secondary | ICD-10-CM | POA: Diagnosis not present

## 2015-12-27 DIAGNOSIS — S72141D Displaced intertrochanteric fracture of right femur, subsequent encounter for closed fracture with routine healing: Secondary | ICD-10-CM | POA: Diagnosis not present

## 2015-12-27 DIAGNOSIS — J4520 Mild intermittent asthma, uncomplicated: Secondary | ICD-10-CM | POA: Diagnosis not present

## 2015-12-28 ENCOUNTER — Encounter: Payer: Self-pay | Admitting: Family Medicine

## 2015-12-28 ENCOUNTER — Ambulatory Visit (INDEPENDENT_AMBULATORY_CARE_PROVIDER_SITE_OTHER): Payer: Medicare Other | Admitting: Family Medicine

## 2015-12-28 VITALS — BP 111/64 | HR 70 | Temp 97.5°F | Ht 64.0 in | Wt 154.6 lb

## 2015-12-28 DIAGNOSIS — R197 Diarrhea, unspecified: Secondary | ICD-10-CM

## 2015-12-28 DIAGNOSIS — Z5181 Encounter for therapeutic drug level monitoring: Secondary | ICD-10-CM

## 2015-12-28 DIAGNOSIS — Z7901 Long term (current) use of anticoagulants: Secondary | ICD-10-CM | POA: Diagnosis not present

## 2015-12-28 DIAGNOSIS — E785 Hyperlipidemia, unspecified: Secondary | ICD-10-CM

## 2015-12-28 DIAGNOSIS — J45901 Unspecified asthma with (acute) exacerbation: Secondary | ICD-10-CM

## 2015-12-28 LAB — COAGUCHEK XS/INR WAIVED
INR: 4.8 — ABNORMAL HIGH (ref 0.9–1.1)
PROTHROMBIN TIME: 57.8 s

## 2015-12-28 MED ORDER — AMOXICILLIN-POT CLAVULANATE 875-125 MG PO TABS: 1.0000 | ORAL_TABLET | Freq: Two times a day (BID) | ORAL | Status: DC

## 2015-12-28 MED ORDER — LORAZEPAM 1 MG PO TABS: 1.0000 mg | ORAL_TABLET | Freq: Two times a day (BID) | ORAL | Status: DC

## 2015-12-28 MED ORDER — ALBUTEROL SULFATE HFA 108 (90 BASE) MCG/ACT IN AERS: 2.0000 | INHALATION_SPRAY | Freq: Four times a day (QID) | RESPIRATORY_TRACT | Status: DC | PRN

## 2015-12-28 NOTE — Progress Notes (Signed)
   HPI  Patient presents today seen today for follow-up.  She was hospitalized about one month ago for hip fracture which was surgically corrected. She spent 2-3 weeks in inpatient rehabilitation and is doing well at home.  She dates her pain is controlled with Tylenol.  Asthma Recently has had increased cough, shortness of breath She has decreased appetite and decreased energy. She's tolerating food and fluids normally. She has no chest pain.  Diarrhea Notes 6-7 loose stools daily, states that it is foul-smelling. She drank juice yesterday, today her loose stools were purple. No black stools, no melena.  He is currently anticoagulated after hip surgery to prevent DVT. She's taking Coumadin 5 mg daily 7 mg on Saturday and Sunday. No signs of bleeding  PMH: Smoking status noted ROS: Per HPI  Objective: BP 111/64 mmHg  Pulse 70  Temp(Src) 97.5 F (36.4 C) (Oral)  Ht '5\' 4"'$  (1.626 m)  Wt 154 lb 9.6 oz (70.126 kg)  BMI 26.52 kg/m2 Gen: NAD, alert, cooperative with exam HEENT: NCAT CV: RRR, good S1/S2, no murmur Resp: Nonlabored, persistent cough, wheezing and coarse sounds throughout Ext: No edema, warm Neuro: Alert and oriented, No gross deficits  Assessment and plan:  # Asthma exacerbation Covering for underlying pneumonia Augmentin No prednisone, previously had worsening diarrhea with this. Xopenex changed to albuterol per patient's request  # Anticoagulated on Coumadin INR supratherapeutic, for headache No signs of bleeding Hold 2 days, decreased by 10%, 5 mg daily to equal total of 35 mg per week, previously 39 mg per week Recheck in 2 weeks, Augmentin should have little effect  # Hyperlipidemia Rechecking labs  # Diarrhea Recent hospitalization, foul-smelling Check for C. difficile Add Metamucil to regulate    Orders Placed This Encounter  Procedures  . Clostridium Difficile by PCR    Order Specific Question:  Is your patient experiencing loose or  watery stools (3 or more in 24 hours)?    Answer:  Yes    Order Specific Question:  Has the patient received laxatives in the last 24 hours?    Answer:  No    Order Specific Question:  Has a negative Cdiff test resulted in the last 7 days?    Answer:  No  . CoaguChek XS/INR Waived  . Lipid panel  . CBC with Differential  . CMP14+EGFR    Meds ordered this encounter  Medications  . amoxicillin-clavulanate (AUGMENTIN) 875-125 MG tablet    Sig: Take 1 tablet by mouth 2 (two) times daily.    Dispense:  20 tablet    Refill:  0  . LORazepam (ATIVAN) 1 MG tablet    Sig: Take 1 tablet (1 mg total) by mouth 2 (two) times daily.    Dispense:  60 tablet    Refill:  2    Please disregard and cancel previous Rx's  . albuterol (PROVENTIL HFA;VENTOLIN HFA) 108 (90 Base) MCG/ACT inhaler    Sig: Inhale 2 puffs into the lungs every 6 (six) hours as needed for wheezing or shortness of breath.    Dispense:  1 Inhaler    Refill:  [place], MD [place] 12/28/2015, 11:52 AM

## 2015-12-28 NOTE — Patient Instructions (Signed)
Great to see you!  I am treating you for an asthma exacerbation I have sent albuterol I have sent augmentin  Coumadin dosing Stop for 2 days, decrease to 5 mg every day Come back in 2 weeks to see the clinical pharmacist for an INR check  Come back to see me in 1 month

## 2015-12-29 LAB — LIPID PANEL
CHOLESTEROL TOTAL: 130 mg/dL (ref 100–199)
Chol/HDL Ratio: 2.5 ratio units (ref 0.0–4.4)
HDL: 53 mg/dL (ref >39)
LDL Calculated: 48 mg/dL (ref 0–99)
Triglycerides: 145 mg/dL (ref 0–149)
VLDL CHOLESTEROL CAL: 29 mg/dL (ref 5–40)

## 2015-12-31 ENCOUNTER — Other Ambulatory Visit: Payer: Self-pay

## 2015-12-31 ENCOUNTER — Telehealth: Payer: Self-pay

## 2015-12-31 ENCOUNTER — Other Ambulatory Visit: Payer: Medicare Other

## 2015-12-31 DIAGNOSIS — R197 Diarrhea, unspecified: Secondary | ICD-10-CM | POA: Diagnosis not present

## 2015-12-31 DIAGNOSIS — Z1212 Encounter for screening for malignant neoplasm of rectum: Secondary | ICD-10-CM

## 2015-12-31 NOTE — Telephone Encounter (Signed)
FYI, stool culture was turned in this morning.  Family is very concerned that her stool is so dark and that she is on Coumadin, they are worried that she may have internal bleeding and would like to speak with you directly about this.  They have asked that you call her daughter, Philomena CourseMelia Eblin, at [phone].  I explained to them that you are out of the office until tomorrow and they said that would be fine to call then.

## 2016-01-01 ENCOUNTER — Telehealth: Payer: Self-pay | Admitting: Family Medicine

## 2016-01-01 ENCOUNTER — Other Ambulatory Visit: Payer: Self-pay

## 2016-01-01 DIAGNOSIS — J4520 Mild intermittent asthma, uncomplicated: Secondary | ICD-10-CM | POA: Diagnosis not present

## 2016-01-01 DIAGNOSIS — S72141D Displaced intertrochanteric fracture of right femur, subsequent encounter for closed fracture with routine healing: Secondary | ICD-10-CM | POA: Diagnosis not present

## 2016-01-01 LAB — CLOSTRIDIUM DIFFICILE BY PCR: Toxigenic C. Difficile by PCR: NEGATIVE

## 2016-01-01 MED ORDER — ROSUVASTATIN CALCIUM 5 MG PO TABS: 5.0000 mg | ORAL_TABLET | Freq: Every day | ORAL | Status: DC

## 2016-01-01 NOTE — Telephone Encounter (Signed)
We are managing Coumadin. Pt has appt to come back in 2 weeks to have INR.

## 2016-01-01 NOTE — Telephone Encounter (Signed)
Called and discussed with her daughter, [name]  Has had one dark black stool, her diarrhea improved after stopping ensure. She has not had any bloody stools. Since the dark black stools she has had more episodes of loose stools that were not black.  She's feeling fine with no new complaints.  She will come in for a CBC which was ordered on the day of the visit.  FOBT is pending. She will also contact orthopedic surgery to ask how long they want her to be anticoagulated for DVT prophylaxis. Consider 40 mg Lovenox daily as a safer alternative  He does call for help or seek emergency medical care if melena frequent or if she sees any bloody stools.    Murtis SinkSam Natalee Tomkiewicz, MD [facility] 01/01/2016, 12:24 PM

## 2016-01-02 ENCOUNTER — Other Ambulatory Visit: Payer: Self-pay

## 2016-01-02 ENCOUNTER — Other Ambulatory Visit: Payer: Medicare Other

## 2016-01-02 DIAGNOSIS — R197 Diarrhea, unspecified: Secondary | ICD-10-CM | POA: Diagnosis not present

## 2016-01-02 DIAGNOSIS — Z7901 Long term (current) use of anticoagulants: Secondary | ICD-10-CM

## 2016-01-02 LAB — FECAL OCCULT BLOOD, IMMUNOCHEMICAL: FECAL OCCULT BLD: NEGATIVE

## 2016-01-03 DIAGNOSIS — J4520 Mild intermittent asthma, uncomplicated: Secondary | ICD-10-CM | POA: Diagnosis not present

## 2016-01-03 DIAGNOSIS — S72141D Displaced intertrochanteric fracture of right femur, subsequent encounter for closed fracture with routine healing: Secondary | ICD-10-CM | POA: Diagnosis not present

## 2016-01-03 LAB — CBC WITH DIFFERENTIAL/PLATELET
Basophils Absolute: 0 10*3/uL (ref 0.0–0.2)
Basos: 0 %
EOS (ABSOLUTE): 0.3 10*3/uL (ref 0.0–0.4)
EOS: 4 %
HEMATOCRIT: 39.5 % (ref 34.0–46.6)
Hemoglobin: 12.5 g/dL (ref 11.1–15.9)
Immature Grans (Abs): 0 10*3/uL (ref 0.0–0.1)
Immature Granulocytes: 0 %
LYMPHS ABS: 3.4 10*3/uL — AB (ref 0.7–3.1)
Lymphs: 40 %
MCH: 30.8 pg (ref 26.6–33.0)
MCHC: 31.6 g/dL (ref 31.5–35.7)
MCV: 97 fL (ref 79–97)
MONOS ABS: 0.4 10*3/uL (ref 0.1–0.9)
Monocytes: 4 %
Neutrophils Absolute: 4.4 10*3/uL (ref 1.4–7.0)
Neutrophils: 52 %
PLATELETS: 226 10*3/uL (ref 150–379)
RBC: 4.06 x10E6/uL (ref 3.77–5.28)
RDW: 14.8 % (ref 12.3–15.4)
WBC: 8.6 10*3/uL (ref 3.4–10.8)

## 2016-01-03 NOTE — Telephone Encounter (Signed)
Nurse called and discussed with patient. PT told to continue warfarin. FOBT negative

## 2016-01-08 DIAGNOSIS — S72141D Displaced intertrochanteric fracture of right femur, subsequent encounter for closed fracture with routine healing: Secondary | ICD-10-CM | POA: Diagnosis not present

## 2016-01-08 DIAGNOSIS — J4520 Mild intermittent asthma, uncomplicated: Secondary | ICD-10-CM | POA: Diagnosis not present

## 2016-01-10 DIAGNOSIS — J4520 Mild intermittent asthma, uncomplicated: Secondary | ICD-10-CM | POA: Diagnosis not present

## 2016-01-10 DIAGNOSIS — S72141D Displaced intertrochanteric fracture of right femur, subsequent encounter for closed fracture with routine healing: Secondary | ICD-10-CM | POA: Diagnosis not present

## 2016-01-13 DIAGNOSIS — J4520 Mild intermittent asthma, uncomplicated: Secondary | ICD-10-CM | POA: Diagnosis not present

## 2016-01-13 DIAGNOSIS — S72141D Displaced intertrochanteric fracture of right femur, subsequent encounter for closed fracture with routine healing: Secondary | ICD-10-CM | POA: Diagnosis not present

## 2016-01-14 ENCOUNTER — Telehealth: Payer: Self-pay | Admitting: Family Medicine

## 2016-01-14 DIAGNOSIS — S72141D Displaced intertrochanteric fracture of right femur, subsequent encounter for closed fracture with routine healing: Secondary | ICD-10-CM | POA: Diagnosis not present

## 2016-01-14 NOTE — Telephone Encounter (Signed)
Patient aware.

## 2016-01-14 NOTE — Telephone Encounter (Signed)
lmtcb

## 2016-01-14 NOTE — Telephone Encounter (Signed)
Please call the patient and tell her that she can discontinue the Coumadin as per the orthopedist following the hip surgery that she has had

## 2016-01-14 NOTE — Telephone Encounter (Signed)
Covering PCP 

## 2016-01-16 ENCOUNTER — Other Ambulatory Visit: Payer: Self-pay | Admitting: Pharmacist

## 2016-01-16 ENCOUNTER — Encounter: Payer: Medicare Other | Admitting: Pharmacist

## 2016-01-18 DIAGNOSIS — S72141D Displaced intertrochanteric fracture of right femur, subsequent encounter for closed fracture with routine healing: Secondary | ICD-10-CM | POA: Diagnosis not present

## 2016-01-18 DIAGNOSIS — J4520 Mild intermittent asthma, uncomplicated: Secondary | ICD-10-CM | POA: Diagnosis not present

## 2016-01-30 ENCOUNTER — Ambulatory Visit (INDEPENDENT_AMBULATORY_CARE_PROVIDER_SITE_OTHER): Payer: Medicare Other | Admitting: Family Medicine

## 2016-01-30 ENCOUNTER — Encounter: Payer: Self-pay | Admitting: Family Medicine

## 2016-01-30 VITALS — BP 101/61 | HR 61 | Temp 96.9°F | Ht 64.0 in | Wt 147.6 lb

## 2016-01-30 DIAGNOSIS — I10 Essential (primary) hypertension: Secondary | ICD-10-CM

## 2016-01-30 DIAGNOSIS — R197 Diarrhea, unspecified: Secondary | ICD-10-CM | POA: Diagnosis not present

## 2016-01-30 NOTE — Progress Notes (Signed)
   HPI  Patient presents today here for follow-up diarrhea and hypertension.  She was started on metoprolol at rehabilitation after hospitalization. She states that this is when her diarrhea started and she is wondering if metoprolol could cause diarrhea. She denies any dizziness, shortness of breath, chest pain, or racing heart.  Diarrhea Continues, 3-4 stools a day, they're loose and watery. They're black after taking Pepto-Bismol. She believes Pepto-Bismol helping. She also has noticed that ensure causes her to have diarrhea, however she likes it so much that she doesn't want to stop it.  PMH: Smoking status noted ROS: Per HPI  Objective: BP 101/61 mmHg  Pulse 61  Temp(Src) 96.9 F (36.1 C) (Oral)  Ht 5\' 4"  (1.626 m)  Wt 147 lb 9.6 oz (66.951 kg)  BMI 25.32 kg/m2 Gen: NAD, alert, cooperative with exam HEENT: NCAT CV: RRR, good S1/S2, no murmur Resp: CTABL, no wheezes, non-labored Abd: SNTND, BS present, no guarding or organomegaly Ext: No edema, warm Neuro: Alert and oriented, walks with a walker  Assessment and plan:  # Diarrhea Stool studies ordered, unclear etiology at this point C. difficile was negative last month Possibly functional diarrhea or possibly diet changes Recommended stopping metoprolol as they are worried about it causing diarrhea, also after 1 week if not improved giving ensure a break for 3 or 4 days to see if that resolves it. Offered GI referral for further testing which they declined today. 7 pounds weight loss since last visit last month.  # HTN Unclear if she actually has diagnosis of hypertension, started on metoprolol and the nursing home which could've been for tachycardia For now we'll stop cautiously and follow up in one month. They will call if she has racing heart or hypertension.  Blood pressure is low enough today that if she patient for rapid heart rate we could use diltiazem   Orders Placed This Encounter  Procedures  . Cdiff  NAA+O+P+Stool Culture    No orders of the defined types were placed in this encounter.    Murtis SinkSam Liannah Yarbough, MD [facility] 01/30/2016, 9:38 AM

## 2016-01-30 NOTE — Patient Instructions (Signed)
Great to see you!  Its ok to stop the metoprolol, come back next month to see your heart rate, blood pressure, and to see how the diarrhea is going.

## 2016-02-05 LAB — CDIFF NAA+O+P+STOOL CULTURE
CDIFFPCR: NEGATIVE
E coli, Shiga toxin Assay: NEGATIVE

## 2016-03-03 ENCOUNTER — Encounter: Payer: Self-pay | Admitting: Family Medicine

## 2016-03-03 ENCOUNTER — Ambulatory Visit (INDEPENDENT_AMBULATORY_CARE_PROVIDER_SITE_OTHER): Payer: Medicare Other | Admitting: Family Medicine

## 2016-03-03 VITALS — BP 120/72 | HR 83 | Temp 96.7°F | Ht 64.0 in | Wt 142.8 lb

## 2016-03-03 DIAGNOSIS — L819 Disorder of pigmentation, unspecified: Secondary | ICD-10-CM

## 2016-03-03 DIAGNOSIS — F419 Anxiety disorder, unspecified: Secondary | ICD-10-CM

## 2016-03-03 DIAGNOSIS — L814 Other melanin hyperpigmentation: Secondary | ICD-10-CM

## 2016-03-03 DIAGNOSIS — K591 Functional diarrhea: Secondary | ICD-10-CM | POA: Diagnosis not present

## 2016-03-03 MED ORDER — LORAZEPAM 1 MG PO TABS: 1.0000 mg | ORAL_TABLET | Freq: Two times a day (BID) | ORAL | 2 refills | Status: DC

## 2016-03-03 NOTE — Patient Instructions (Signed)
Great to see you!  Come back in 3 months to discuss anxiety

## 2016-03-03 NOTE — Progress Notes (Signed)
   HPI  Patient presents today here to follow-up for diarrhea, anxiety, and hypertension.  Hypertension , Unclear diagnosis, previously she was teacher treated with metoprolol. She had no obvious history of hypertension or tachycardia. Last visit they were concerned that metoprolol was causing diarrhea, they discontinued it and her blood pressure has been fine since. No tachycardia   Diarrhea Improving, now having 2-4 episodes a day, on many days she does not feel that it's a problem It does seem like she has increased stools after drinking ensure, however she continues to drink them a few days a week.   Anxiety Tolerating Cymbalta well Feels like her anxiety is improving, she still taking Ativan 1 pill twice daily.  Brown macules have been developing on her bilateral legs for the last several months, no pain, itching, erythema, or areas of induration.  PMH: Smoking status noted ROS: Per HPI  Objective: BP 120/72 (BP Location: Right Arm, Patient Position: Sitting, Cuff Size: Normal)   Pulse 83   Temp (!) 96.7 F (35.9 C) (Oral)   Ht 5\' 4"  (1.626 m)   Wt 142 lb 12.8 oz (64.8 kg)   BMI 24.51 kg/m  Gen: NAD, alert, cooperative with exam HEENT: NCAT CV: RRR, good S1/S2, no murmur Resp: CTABL, no wheezes, non-labored Ext: No edema, warm Neuro: Alert and oriented, No gross deficits   skin: 10-20 flat light brown macules on bilateral lower extremities, similar appearance to arms, arms have been stable for many years  Assessment and plan:  # Anxiety Improved on Cymbalta, refilled Ativan Discussed goals of reducing overall benzodiazepine use, patient will try on half pill twice daily or 1 pill once daily for a time.   # Diarrhea Improving Possibly diet related, continue to monitor, her weight is still down a little bit. She feels overall it's improved. Metoprolol likely not related  # Solar lentigo Reassurance provided Continue to follow  # Blood pressure Unclear  history/reason for metoprolol use Discontinue metoprolol, patient is tolerating very easily       Meds ordered this encounter  Medications  . LORazepam (ATIVAN) 1 MG tablet    Sig: Take 1 tablet (1 mg total) by mouth 2 (two) times daily.    Dispense:  60 tablet    Refill:  2    Please disregard and cancel previous Rx's    Murtis SinkSam Bradshaw, MD [facility] 03/03/2016, 10:10 AM

## 2016-03-27 ENCOUNTER — Other Ambulatory Visit: Payer: Self-pay | Admitting: Family Medicine

## 2016-04-14 ENCOUNTER — Telehealth: Payer: Self-pay | Admitting: Family Medicine

## 2016-04-14 DIAGNOSIS — Z23 Encounter for immunization: Secondary | ICD-10-CM | POA: Diagnosis not present

## 2016-04-14 MED ORDER — ESOMEPRAZOLE MAGNESIUM 20 MG PO CPDR: 20.0000 mg | DELAYED_RELEASE_CAPSULE | Freq: Every day | ORAL | 3 refills | Status: DC

## 2016-04-14 NOTE — Telephone Encounter (Signed)
Ok with change, Nexium sent.   Murtis SinkSam Bradshaw, MD [facility] 04/14/2016, 4:59 PM

## 2016-04-15 ENCOUNTER — Telehealth: Payer: Self-pay | Admitting: *Deleted

## 2016-04-15 NOTE — Telephone Encounter (Signed)
Addressed in a phone note 1 day ago.   Mikayla SinkSam Linzey Ramser, MD [facility] 04/15/2016, 9:47 AM

## 2016-04-15 NOTE — Telephone Encounter (Signed)
Left detailed message per dpr  

## 2016-04-30 ENCOUNTER — Other Ambulatory Visit: Payer: Self-pay | Admitting: Family Medicine

## 2016-04-30 NOTE — Telephone Encounter (Signed)
Last filled 03/27/16, last seen 03/03/16. Route to pool A, nurse call in

## 2016-05-01 NOTE — Telephone Encounter (Signed)
Per [place] this was called in yesterday

## 2016-06-03 ENCOUNTER — Ambulatory Visit (INDEPENDENT_AMBULATORY_CARE_PROVIDER_SITE_OTHER): Payer: Medicare Other | Admitting: Family Medicine

## 2016-06-03 ENCOUNTER — Encounter: Payer: Self-pay | Admitting: Family Medicine

## 2016-06-03 ENCOUNTER — Ambulatory Visit: Payer: Medicare Other | Admitting: Family Medicine

## 2016-06-03 VITALS — BP 137/71 | HR 74 | Temp 97.3°F

## 2016-06-03 DIAGNOSIS — E785 Hyperlipidemia, unspecified: Secondary | ICD-10-CM

## 2016-06-03 DIAGNOSIS — F419 Anxiety disorder, unspecified: Secondary | ICD-10-CM

## 2016-06-03 DIAGNOSIS — R197 Diarrhea, unspecified: Secondary | ICD-10-CM

## 2016-06-03 DIAGNOSIS — K219 Gastro-esophageal reflux disease without esophagitis: Secondary | ICD-10-CM | POA: Diagnosis not present

## 2016-06-03 DIAGNOSIS — Z23 Encounter for immunization: Secondary | ICD-10-CM | POA: Diagnosis not present

## 2016-06-03 IMAGING — CR DG FEMUR 2+V*R*
1 series · 1 of 1 positions shown · non-contrast
Comparison: None.

CLINICAL DATA: Fall.  Right hip pain.

EXAM:
RIGHT FEMUR 2 VIEWS

[w hip lat right]
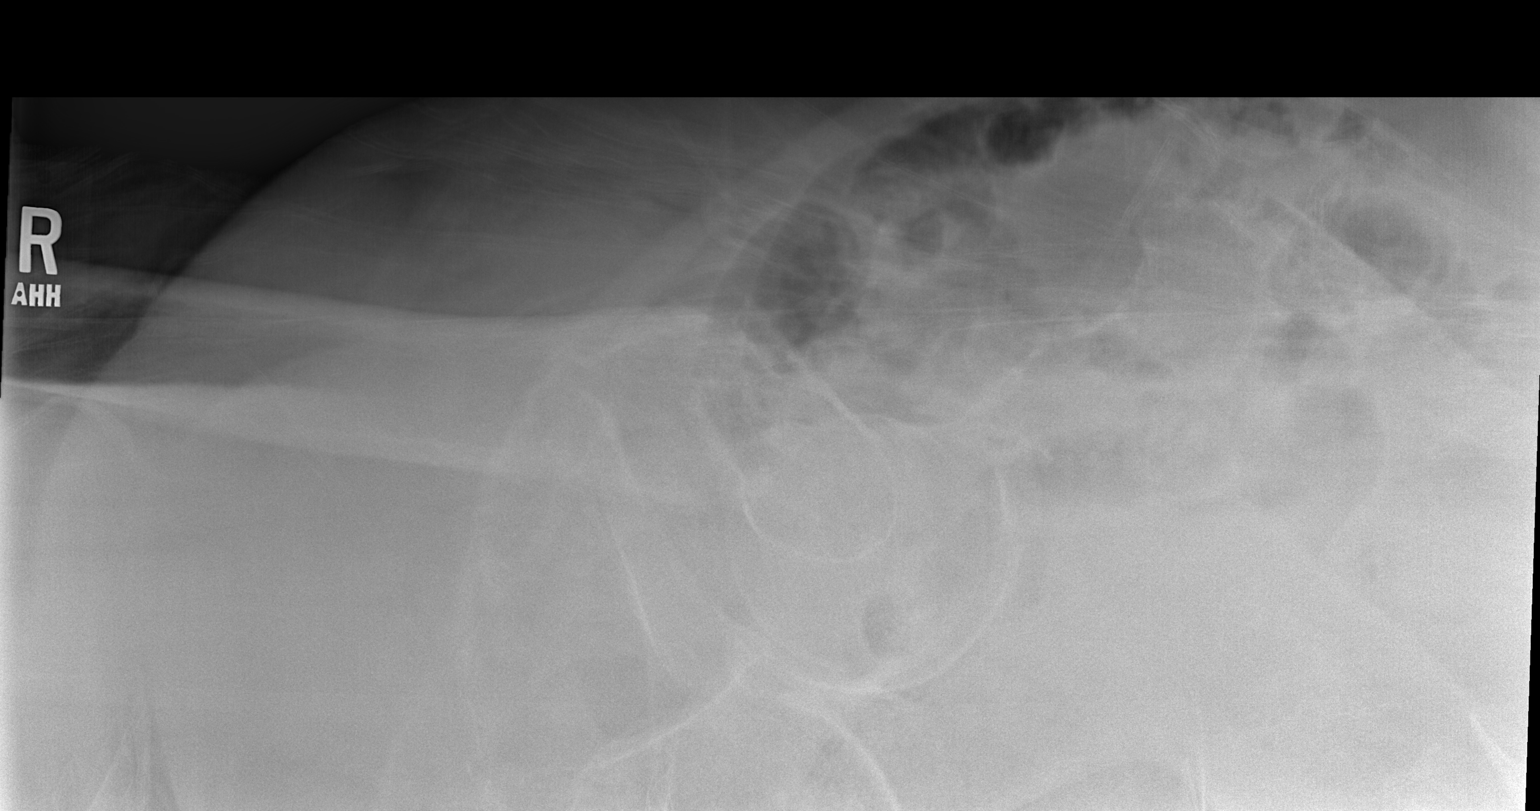

[1 of 1 positions shown; findings below may reference images not displayed]

FINDINGS: There is a comminuted intertrochanteric right proximal femur
fracture with mild apex lateral angulation, mild impaction and mild
6 mm medial displacement of the dominant distal fracture fragment.
No additional fracture. No dislocation at the right hip or right
knee. No suspicious focal osseous lesion.
IMPRESSION: Comminuted intertrochanteric right proximal femur fracture as
described.

## 2016-06-03 IMAGING — CR DG LUMBAR SPINE COMPLETE 4+V
6 series · 6 of 6 positions shown · non-contrast
Comparison: None.

CLINICAL DATA: Fall today.  Landed on RIGHT side.

EXAM:
LUMBAR SPINE - COMPLETE 4+ VIEW

[t lumbar spine ap]
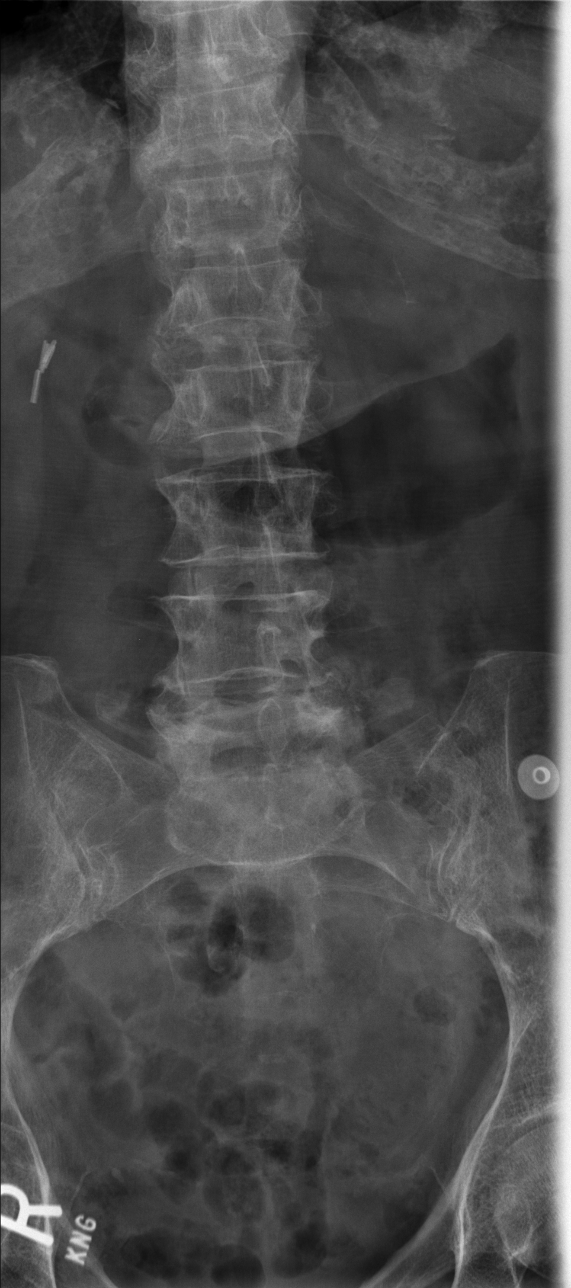

[t lumbar spine obl (1 of 3)]
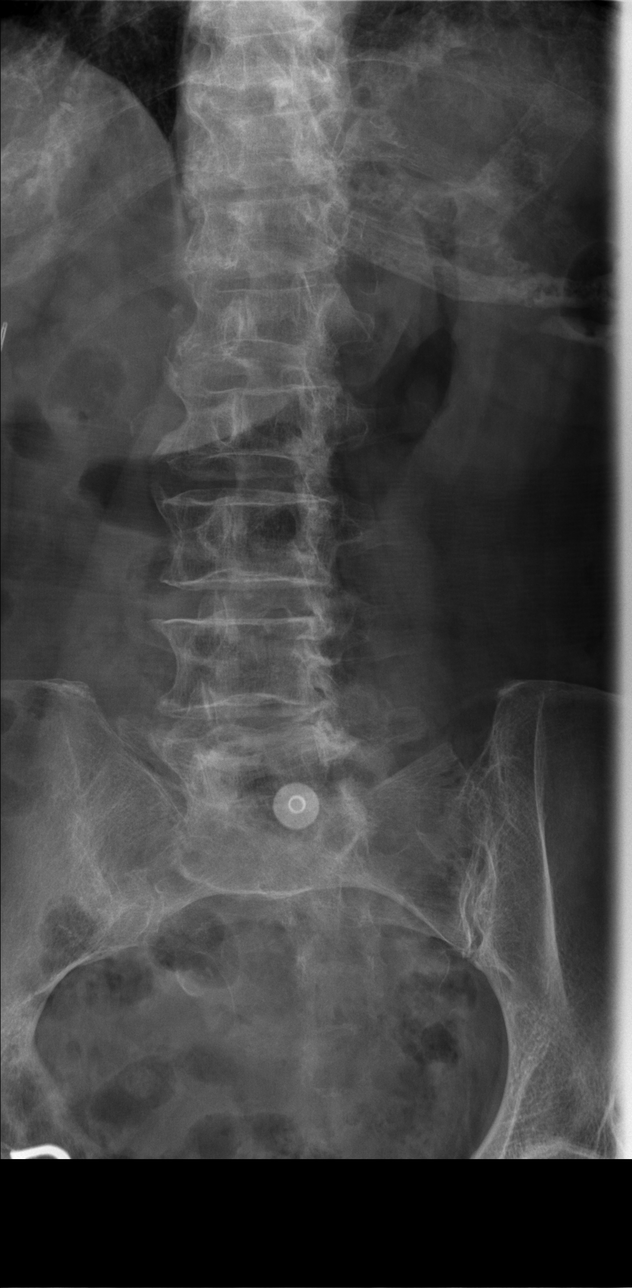

[t lumbar spine obl (2 of 3)]
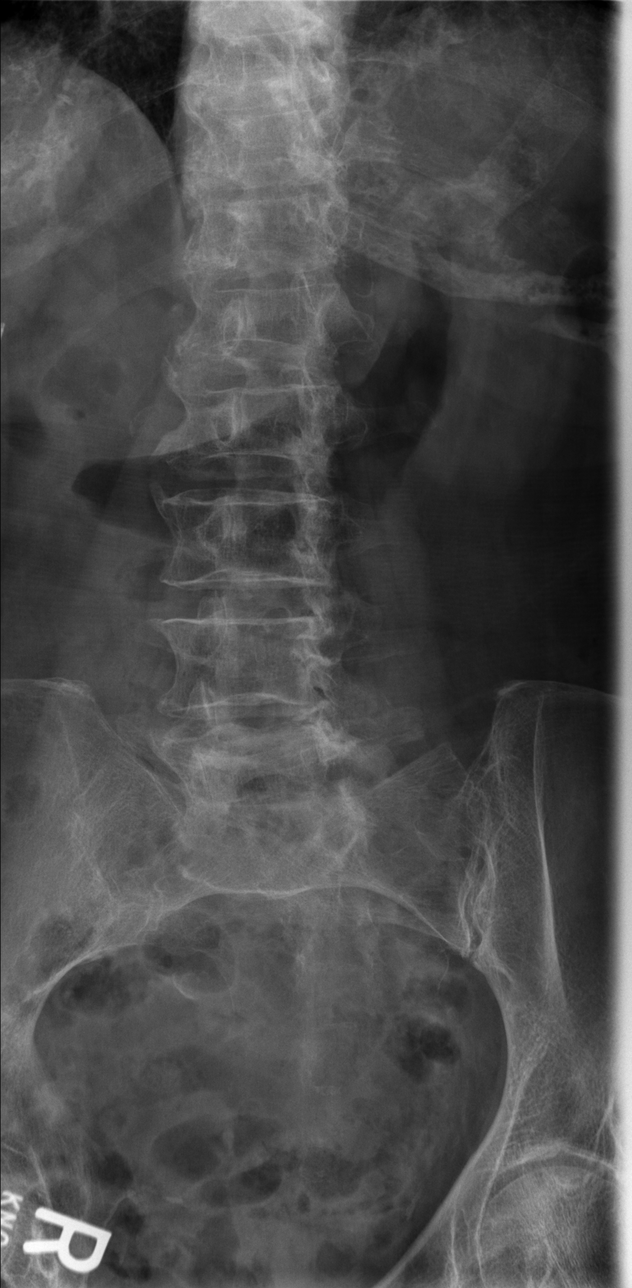

[t lumbar spine obl (3 of 3)]
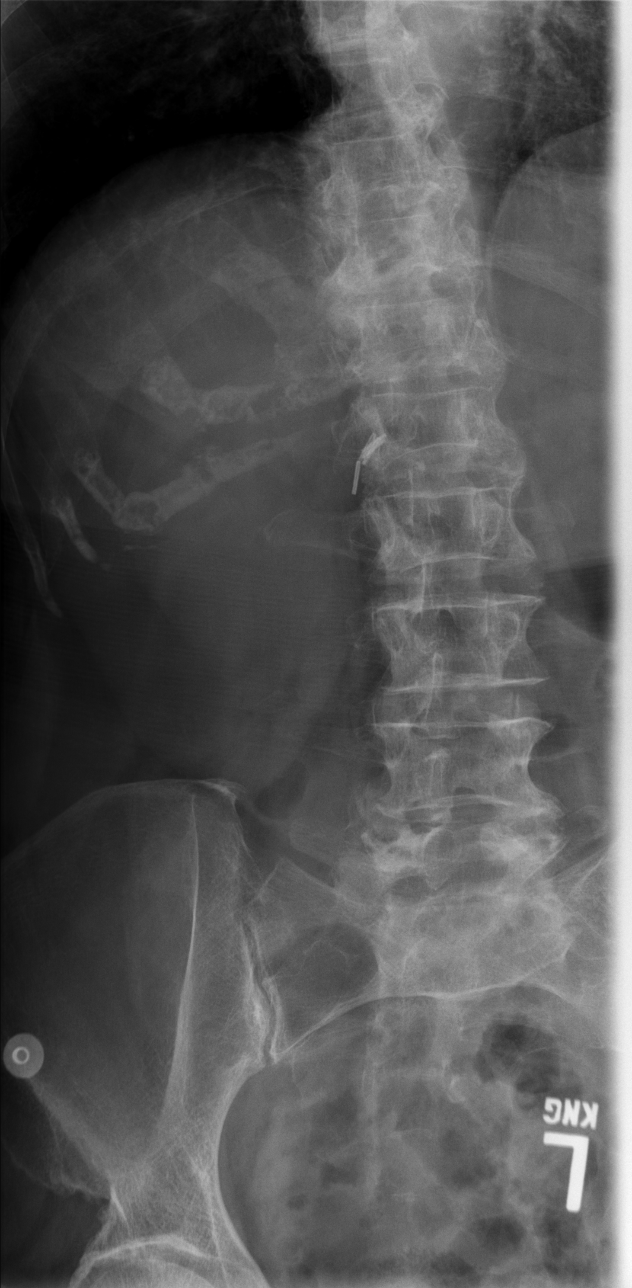

[w lumbar spine lat]
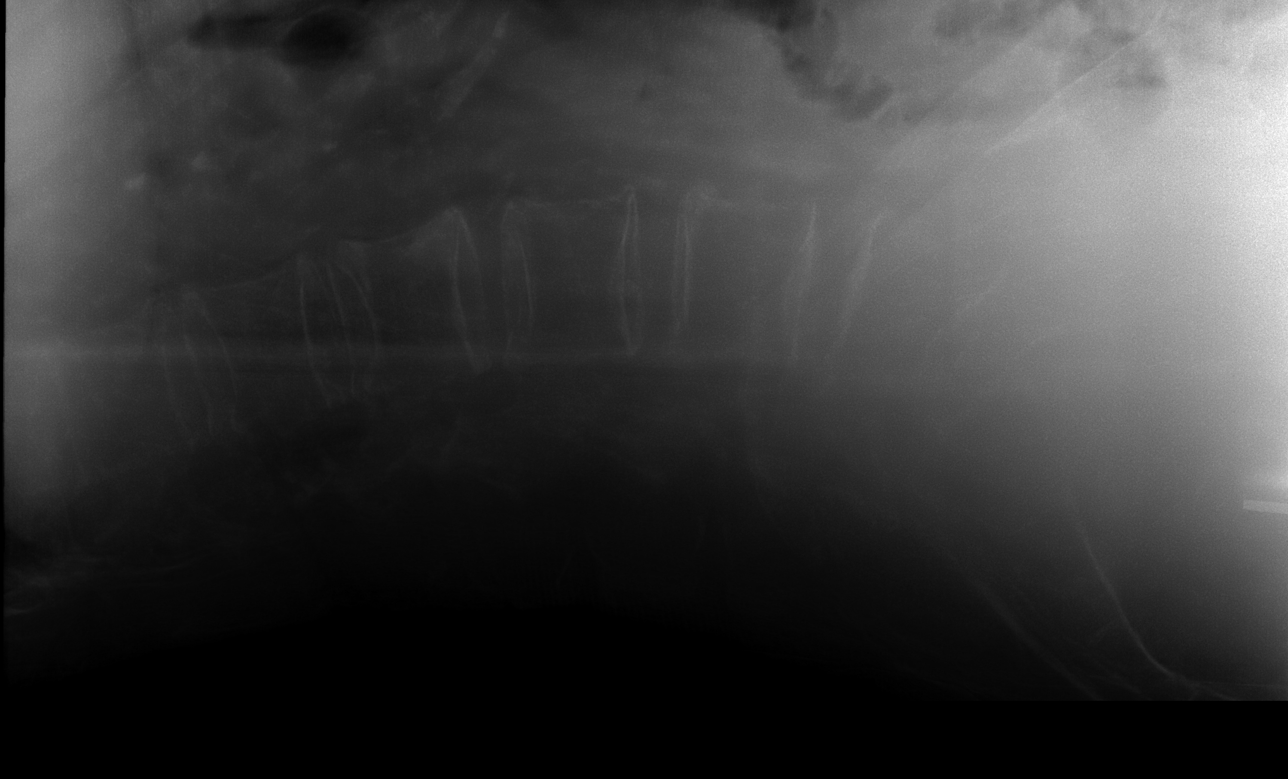

[w lumbar l-5 s-1 spot]
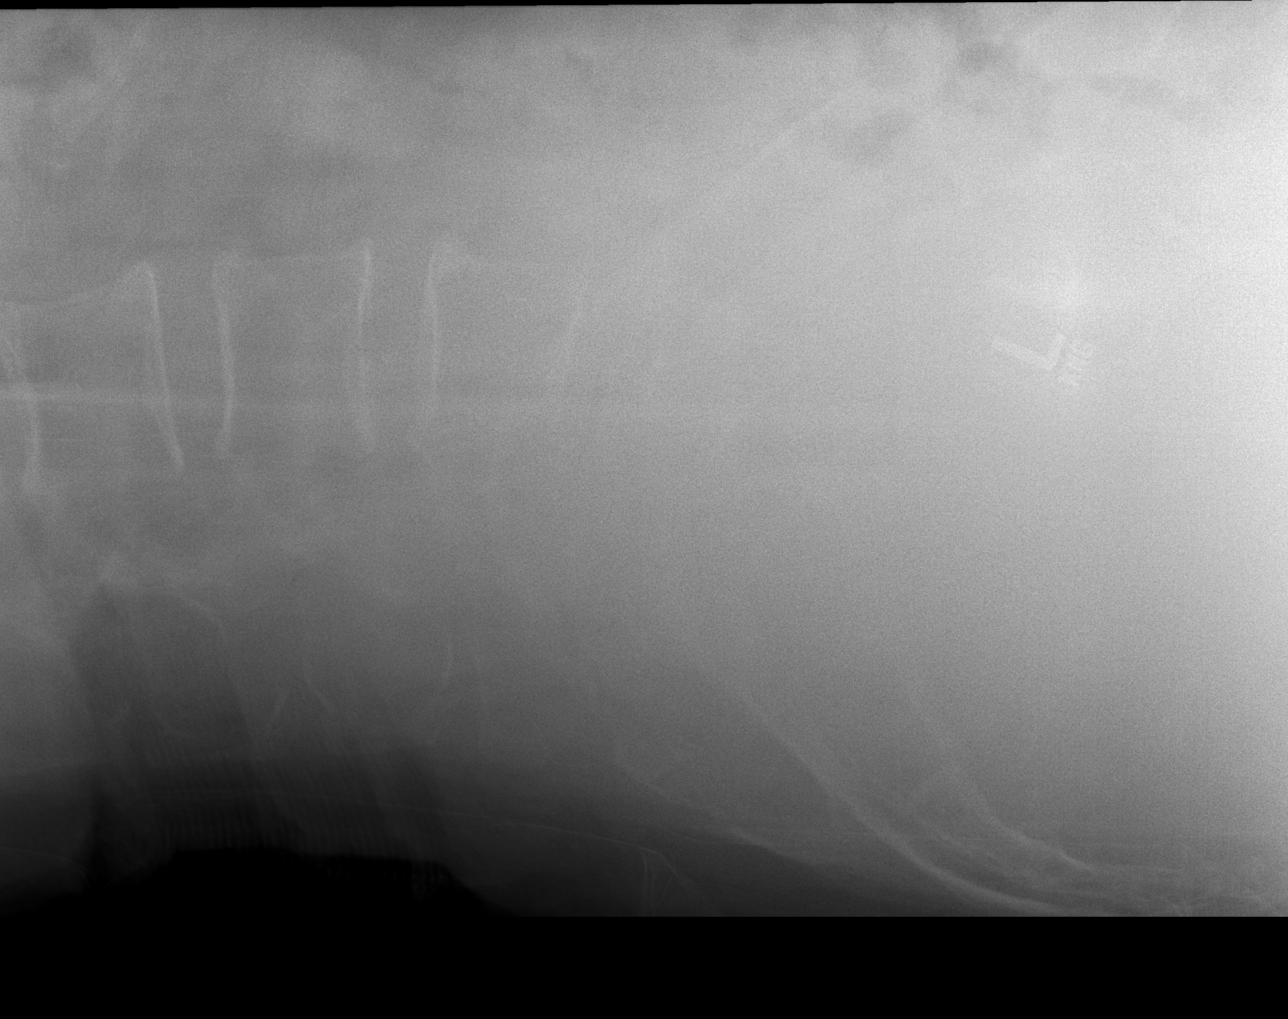

[6 of 6 positions shown; findings below may reference images not displayed]

FINDINGS: Normal alignment of lumbar vertebral bodies. No loss of vertebral
body height or disc height. There is osteopenia noted. Multiple
levels of endplate spurring.
IMPRESSION: No acute findings lumbar spine.

## 2016-06-03 IMAGING — RF DG HIP (WITH PELVIS) OPERATIVE*R*
1 series · 5 of 5 positions shown · non-contrast
Comparison: Right hip radiographs dated 11/30/2014 at 1988 seconds

CLINICAL DATA: Intraoperative intramedullary nail intertrochanteric
right hip

EXAM:
OPERATIVE right HIP (WITH PELVIS IF PERFORMED) 5 VIEWS
TECHNIQUE: Fluoroscopic spot image(s) were submitted for interpretation
post-operatively.
FLUOROSCOPY TIME:  1 minutes 12 seconds

[Series 1: run · 5 of 5 slices shown]
[im 1/5]
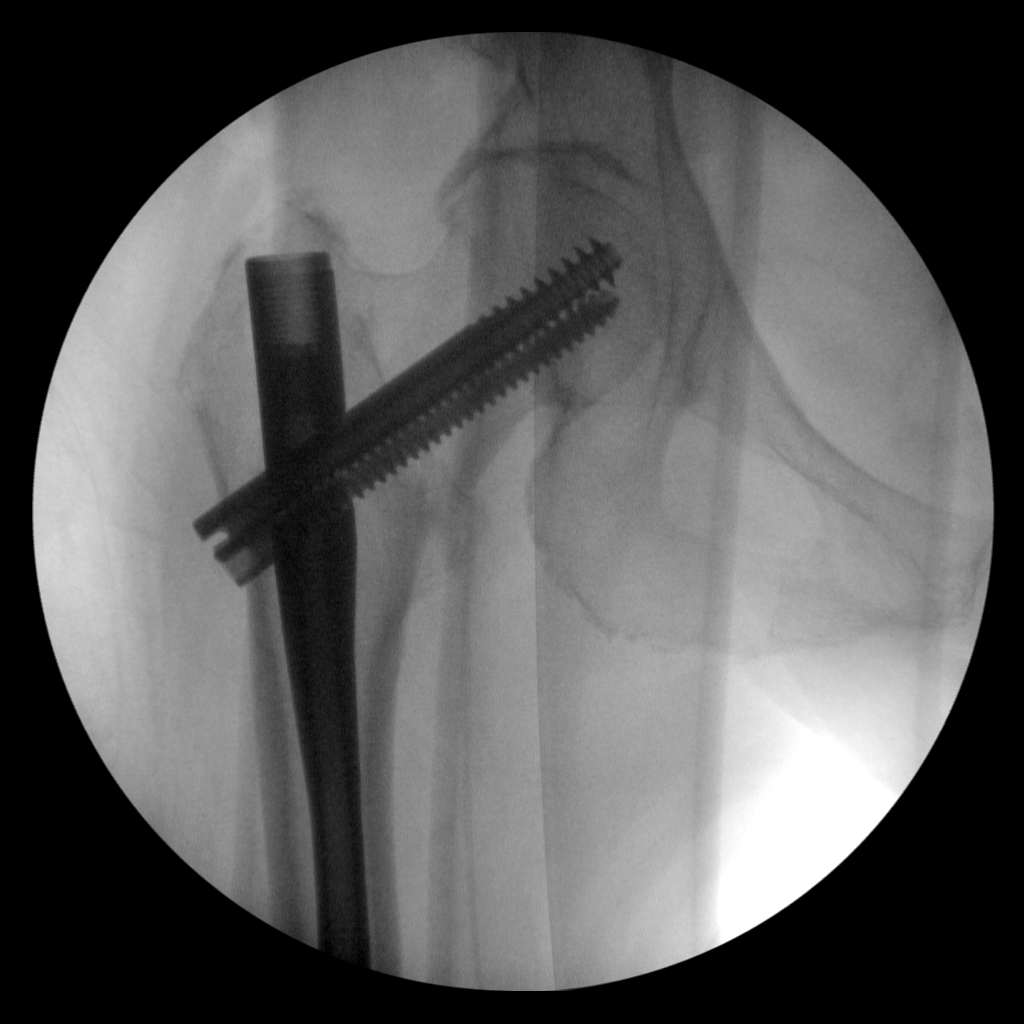
[im 2/5]
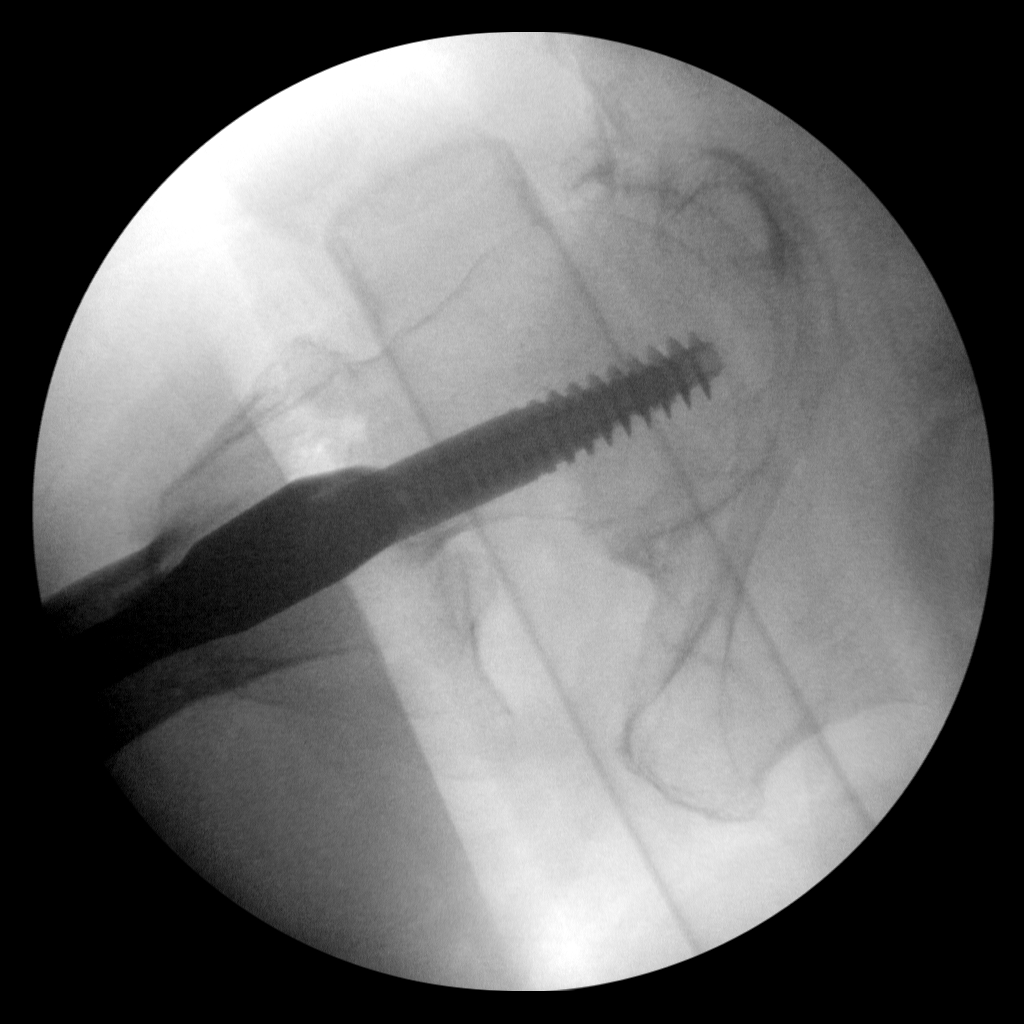
[im 3/5]
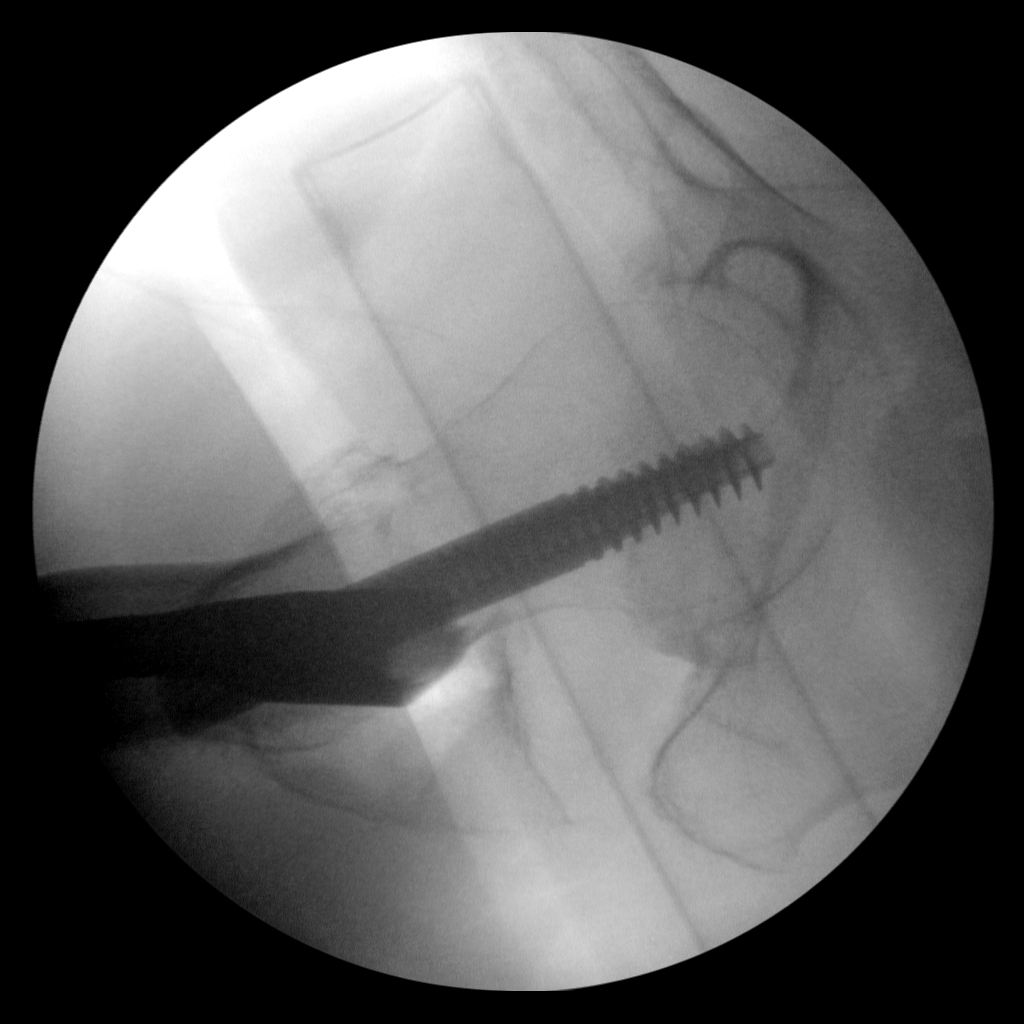
[im 4/5]
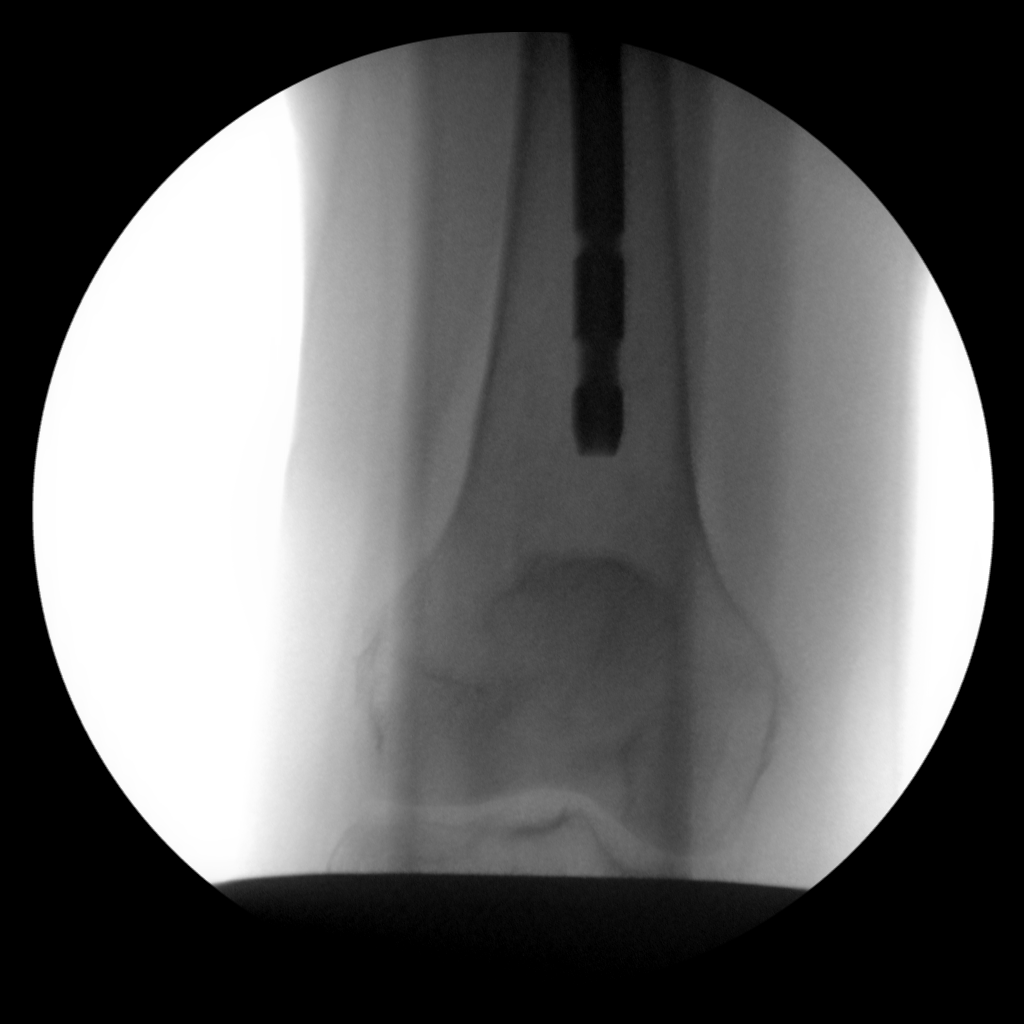
[im 5/5]
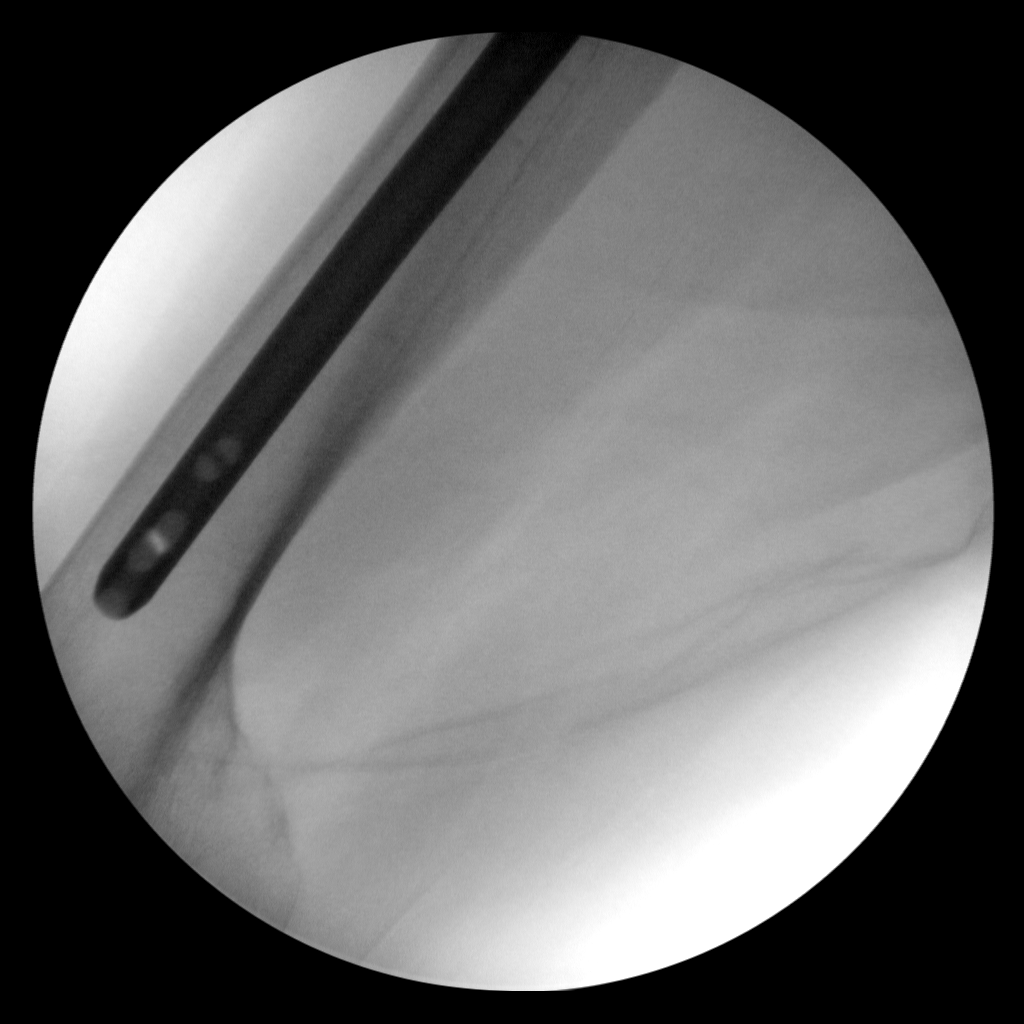

[5 of 5 positions shown; findings below may reference images not displayed]

FINDINGS: Intraoperative fluoroscopic images during IM nail with dynamic hip
screw fixation of an intertrochanteric right hip fracture.

Fracture fragments are in near anatomic alignment.
IMPRESSION: Intraoperative fluoroscopic images during ORIF of an
intertrochanteric right hip fracture, as above.

## 2016-06-03 MED ORDER — LORAZEPAM 1 MG PO TABS: 1.0000 mg | ORAL_TABLET | Freq: Two times a day (BID) | ORAL | 2 refills | Status: DC | PRN

## 2016-06-03 MED ORDER — FAMOTIDINE 20 MG PO TABS: 20.0000 mg | ORAL_TABLET | Freq: Two times a day (BID) | ORAL | 3 refills | Status: DC

## 2016-06-03 MED ORDER — DULOXETINE HCL 40 MG PO CPEP: 40.0000 mg | ORAL_CAPSULE | Freq: Every day | ORAL | 3 refills | Status: DC

## 2016-06-03 MED ORDER — ROSUVASTATIN CALCIUM 5 MG PO TABS: 5.0000 mg | ORAL_TABLET | Freq: Every day | ORAL | 3 refills | Status: DC

## 2016-06-03 NOTE — Progress Notes (Signed)
   HPI  Patient presents today here to follow-up for anxiety, hyperlipidemia, GERD, and diarrhea.  Diarrhea, continued, improved with changing PPI. She still feels it's due to the PPI. She states she gets upset stomach if she does not take it.  Hyperlipidemia Needs refill of Crestor, tolerating without side effect.  Anxiety She is certainly improved since starting Cymbalta, however she is still having some panic attacks, she's tolerating Cymbalta without side effect She has reduced her Ativan dose to 1 pill once daily, she is willing to reduce the dose further.   PMH: Smoking status noted ROS: Per HPI  Objective: BP 137/71   Pulse 74   Temp 97.3 F (36.3 C) (Oral)  Gen: NAD, alert, cooperative with exam HEENT: NCAT CV: RRR, good S1/S2, no murmur Resp: CTABL, no wheezes, non-labored Ext: No edema, warm Neuro: Alert and oriented, No gross deficits  Assessment and plan:  # Anxiety Improved since starting Cymbalta, however not completely controlled yet Increase Cymbalta 40 mg daily Reduce Ativan dose to 1 pill once a day, 10 pills for as needed symptoms Anything to reduce dose to 1/2 mg once to twice daily as the next step, patient will try half tablets  He filled Ativan, note written on the prescription to DC all other Ativan prescriptions.  # HLD Repeating labs, fasting Refilled Crestor   # GERD, diarrhea Patient with persistent diarrhea, she's also had some weight loss in the last 3 months, she is resistant to further evaluation of her diarrhea this point. She believes it's PPI related, she will stop Nexium and try twice-daily Pepcid.  Need for immunization against pneumonia Prevnar given today, counseling provided for all vaccine components    Orders Placed This Encounter  Procedures  . CBC with Differential/Platelet  . CMP14+EGFR  . Lipid panel    Meds ordered this encounter  Medications  . DULoxetine 40 MG CPEP    Sig: Take 40 mg by mouth daily.   Dispense:  90 capsule    Refill:  3  . rosuvastatin (CRESTOR) 5 MG tablet    Sig: Take 1 tablet (5 mg total) by mouth daily.    Dispense:  90 tablet    Refill:  3  . LORazepam (ATIVAN) 1 MG tablet    Sig: Take 1 tablet (1 mg total) by mouth 2 (two) times daily as needed for anxiety.    Dispense:  40 tablet    Refill:  2  . famotidine (PEPCID) 20 MG tablet    Sig: Take 1 tablet (20 mg total) by mouth 2 (two) times daily.    Dispense:  60 tablet    Refill:  [place], MD [place] 06/03/2016, 10:25 AM

## 2016-06-03 NOTE — Patient Instructions (Signed)
Great to see you!  Stop nexium, start famotidine twice daily  Come back in 3 months  We will call within 1 week with your lab results

## 2016-06-04 LAB — CBC WITH DIFFERENTIAL/PLATELET
BASOS ABS: 0 10*3/uL (ref 0.0–0.2)
Basos: 1 %
EOS (ABSOLUTE): 0.6 10*3/uL — AB (ref 0.0–0.4)
Eos: 7 %
Hematocrit: 41.3 % (ref 34.0–46.6)
Hemoglobin: 13.5 g/dL (ref 11.1–15.9)
IMMATURE GRANS (ABS): 0 10*3/uL (ref 0.0–0.1)
IMMATURE GRANULOCYTES: 0 %
LYMPHS: 37 %
Lymphocytes Absolute: 3.1 10*3/uL (ref 0.7–3.1)
MCH: 32.8 pg (ref 26.6–33.0)
MCHC: 32.7 g/dL (ref 31.5–35.7)
MCV: 100 fL — ABNORMAL HIGH (ref 79–97)
Monocytes Absolute: 0.4 10*3/uL (ref 0.1–0.9)
Monocytes: 5 %
NEUTROS PCT: 50 %
Neutrophils Absolute: 4.3 10*3/uL (ref 1.4–7.0)
PLATELETS: 207 10*3/uL (ref 150–379)
RBC: 4.12 x10E6/uL (ref 3.77–5.28)
RDW: 13.7 % (ref 12.3–15.4)
WBC: 8.5 10*3/uL (ref 3.4–10.8)

## 2016-06-04 LAB — CMP14+EGFR
A/G RATIO: 2.4 — AB (ref 1.2–2.2)
ALBUMIN: 4.6 g/dL (ref 3.5–4.7)
ALK PHOS: 53 IU/L (ref 39–117)
ALT: 26 IU/L (ref 0–32)
AST: 35 IU/L (ref 0–40)
BILIRUBIN TOTAL: 0.4 mg/dL (ref 0.0–1.2)
BUN / CREAT RATIO: 10 — AB (ref 12–28)
BUN: 7 mg/dL — ABNORMAL LOW (ref 8–27)
CHLORIDE: 99 mmol/L (ref 96–106)
CO2: 25 mmol/L (ref 18–29)
Calcium: 9.1 mg/dL (ref 8.7–10.3)
Creatinine, Ser: 0.73 mg/dL (ref 0.57–1.00)
GFR calc non Af Amer: 76 mL/min/{1.73_m2} (ref >59)
GFR, EST AFRICAN AMERICAN: 88 mL/min/{1.73_m2} (ref >59)
GLOBULIN, TOTAL: 1.9 g/dL (ref 1.5–4.5)
GLUCOSE: 88 mg/dL (ref 65–99)
POTASSIUM: 3.3 mmol/L — AB (ref 3.5–5.2)
SODIUM: 141 mmol/L (ref 134–144)
TOTAL PROTEIN: 6.5 g/dL (ref 6.0–8.5)

## 2016-06-04 LAB — LIPID PANEL
CHOLESTEROL TOTAL: 142 mg/dL (ref 100–199)
Chol/HDL Ratio: 2.5 ratio units (ref 0.0–4.4)
HDL: 56 mg/dL (ref >39)
LDL Calculated: 52 mg/dL (ref 0–99)
Triglycerides: 171 mg/dL — ABNORMAL HIGH (ref 0–149)
VLDL CHOLESTEROL CAL: 34 mg/dL (ref 5–40)

## 2016-06-24 ENCOUNTER — Other Ambulatory Visit: Payer: Self-pay | Admitting: *Deleted

## 2016-06-24 MED ORDER — LORATADINE 10 MG PO TABS
10.0000 mg | ORAL_TABLET | Freq: Every day | ORAL | 11 refills | Status: DC | PRN
Start: 2016-06-24 — End: 2017-07-08

## 2016-08-18 ENCOUNTER — Ambulatory Visit (INDEPENDENT_AMBULATORY_CARE_PROVIDER_SITE_OTHER): Payer: Medicare Other | Admitting: Family Medicine

## 2016-08-18 ENCOUNTER — Encounter: Payer: Self-pay | Admitting: Family Medicine

## 2016-08-18 VITALS — BP 135/71 | HR 77 | Temp 97.7°F | Ht 64.0 in | Wt 130.4 lb

## 2016-08-18 DIAGNOSIS — F419 Anxiety disorder, unspecified: Secondary | ICD-10-CM | POA: Diagnosis not present

## 2016-08-18 DIAGNOSIS — E876 Hypokalemia: Secondary | ICD-10-CM | POA: Diagnosis not present

## 2016-08-18 DIAGNOSIS — K219 Gastro-esophageal reflux disease without esophagitis: Secondary | ICD-10-CM | POA: Diagnosis not present

## 2016-08-18 MED ORDER — LORAZEPAM 0.5 MG PO TABS: 0.5000 mg | ORAL_TABLET | Freq: Two times a day (BID) | ORAL | 2 refills | Status: DC | PRN

## 2016-08-18 MED ORDER — ESCITALOPRAM OXALATE 10 MG PO TABS: 10.0000 mg | ORAL_TABLET | Freq: Every day | ORAL | 5 refills | Status: DC

## 2016-08-18 NOTE — Progress Notes (Signed)
   HPI  Patient presents today along for anxiety.  Patient states that Cymbalta was actually causing diarrhea. She took this for several months and has gone down from 40 mg to 20 mg for about a week, she stopped taking 12 mg a few days ago. She states her diarrhea has improved and she feels that this was giving her diarrhea.  She's been giving her daughter a very hard time, she is calling and complaining a lot. She seems more anxious than the patient admits.  She has had very good improvement over the last year, she has gone from 3 or 4 pills of Ativan daily to less than 1 per day using Cymbalta. However now she states she cannot tolerate Cymbalta.  Hypokalemia No diet changes Requests repeat lab work today.   PMH: Smoking status noted ROS: Per HPI  Objective: BP 135/71   Pulse 77   Temp 97.7 F (36.5 C) (Oral)   Ht _0  (1.626 m)   Wt 130 lb 6.4 oz (59.1 kg)   BMI 22.38 kg/m  Gen: NAD, alert, cooperative with exam HEENT: NCAT CV: RRR, good S1/S2, no murmur Resp: CTABL, no wheezes, non-labored Ext: No edema, warm Neuro: Alert and oriented, No gross deficits  Psych:  Appropriate Mood and affect, No SI  Depression screen [facility] 2/9 08/18/2016 06/03/2016 03/03/2016 01/30/2016 12/28/2015  Decreased Interest 0 0 0 0 0  Down, Depressed, Hopeless 0 0 0 0 0  PHQ - 2 Score 0 0 0 0 0  Altered sleeping - - - - -  Tired, decreased energy - - - - -  Change in appetite - - - - -  Feeling bad or failure about yourself  - - - - -  Trouble concentrating - - - - -  Moving slowly or fidgety/restless - - - - -  Suicidal thoughts - - - - -  PHQ-9 Score - - - - -    Assessment and plan:  # Anxiety Doing well on Cymbalta, however does not tolerate side effects. Change to 10 mg of Lexapro Also decrease the Lexapro to 0.5 mg 1-2 times daily as needed, encouraged her to try it for nighttime sleep as this is her primary complaint Also consider Seroquel at night to help with sleep and  anxiety.   # Hypokalemia Repeat labs  # GERD Doing well on PPI, continue. Reports good symptom improvement.  Unclear if she is taking PPI + H2    Orders Placed This Encounter  Procedures  . BMP8+EGFR    Meds ordered this encounter  Medications  . omeprazole (PRILOSEC) 20 MG capsule  . LORazepam (ATIVAN) 0.5 MG tablet    Sig: Take 1 tablet (0.5 mg total) by mouth 2 (two) times daily as needed for anxiety.    Dispense:  40 tablet    Refill:  2    Change in dose, please DC remaining 1 mg refills.  Marland Kitchen escitalopram (LEXAPRO) 10 MG tablet    Sig: Take 1 tablet (10 mg total) by mouth daily.    Dispense:  30 tablet    Refill:  [place], MD [facility] 08/18/2016, 10:06 AM

## 2016-08-18 NOTE — Patient Instructions (Signed)
Great to see you!  Start lexapro 1 pill once daily, it is ok to stop cymbalta  Try ativan at night for sleep.   Come back in 1 month to discuss anxiety

## 2016-08-19 ENCOUNTER — Other Ambulatory Visit: Payer: Self-pay | Admitting: Family Medicine

## 2016-08-19 LAB — BMP8+EGFR
BUN / CREAT RATIO: 9 — AB (ref 12–28)
BUN: 6 mg/dL — ABNORMAL LOW (ref 8–27)
CO2: 28 mmol/L (ref 18–29)
Calcium: 8.9 mg/dL (ref 8.7–10.3)
Chloride: 102 mmol/L (ref 96–106)
Creatinine, Ser: 0.64 mg/dL (ref 0.57–1.00)
GFR, EST AFRICAN AMERICAN: 95 mL/min/{1.73_m2} (ref >59)
GFR, EST NON AFRICAN AMERICAN: 83 mL/min/{1.73_m2} (ref >59)
Glucose: 96 mg/dL (ref 65–99)
POTASSIUM: 3.3 mmol/L — AB (ref 3.5–5.2)
SODIUM: 145 mmol/L — AB (ref 134–144)

## 2016-08-19 MED ORDER — POTASSIUM CHLORIDE CRYS ER 20 MEQ PO TBCR: 20.0000 meq | EXTENDED_RELEASE_TABLET | Freq: Every day | ORAL | 3 refills | Status: DC

## 2016-09-04 ENCOUNTER — Ambulatory Visit: Payer: Medicare Other | Admitting: Family Medicine

## 2016-09-15 ENCOUNTER — Ambulatory Visit: Payer: Medicare Other | Admitting: Family Medicine

## 2016-09-17 ENCOUNTER — Telehealth: Payer: Self-pay

## 2016-09-17 NOTE — Telephone Encounter (Signed)
Received fax from pharmacy stating that patient came to pharmacy stating that rx for lorazepam 1mg  is supposed to be restarted but what they have had on file was cancelled. Please advise and route to Pool A so nurse can contact pharmacy

## 2016-09-18 NOTE — Telephone Encounter (Signed)
Patient should not have dose changed without appointment.   Murtis SinkSam Bradshaw, MD [facility] 09/18/2016, 7:42 AM

## 2016-09-18 NOTE — Telephone Encounter (Signed)
[facility] [place] [facility] and advised pt is on the 0.5mg  lorazepam and would need to be seen prior to any changes being made. Bjorn LoserRhonda states she will let the pt know.

## 2016-09-23 ENCOUNTER — Ambulatory Visit: Payer: Medicare Other | Admitting: Family Medicine

## 2016-11-04 ENCOUNTER — Encounter: Payer: Self-pay | Admitting: Family Medicine

## 2016-11-04 ENCOUNTER — Ambulatory Visit (INDEPENDENT_AMBULATORY_CARE_PROVIDER_SITE_OTHER): Payer: Medicare Other | Admitting: Family Medicine

## 2016-11-04 VITALS — BP 126/68 | HR 67 | Temp 97.3°F | Ht 64.0 in | Wt 138.2 lb

## 2016-11-04 DIAGNOSIS — E785 Hyperlipidemia, unspecified: Secondary | ICD-10-CM | POA: Diagnosis not present

## 2016-11-04 DIAGNOSIS — F419 Anxiety disorder, unspecified: Secondary | ICD-10-CM | POA: Diagnosis not present

## 2016-11-04 DIAGNOSIS — R0982 Postnasal drip: Secondary | ICD-10-CM | POA: Diagnosis not present

## 2016-11-04 MED ORDER — FLUTICASONE PROPIONATE 50 MCG/ACT NA SUSP: 2.0000 | Freq: Every day | NASAL | 5 refills | Status: DC

## 2016-11-04 MED ORDER — LORAZEPAM 1 MG PO TABS: 1.0000 mg | ORAL_TABLET | Freq: Two times a day (BID) | ORAL | 2 refills | Status: DC | PRN

## 2016-11-04 NOTE — Progress Notes (Signed)
   HPI  Patient presents today here for follow-up anxiety, cough, and hyperlipidemia.  Patient is fasting, she would like her cholesterol checked. She good medication compliance with Crestor, however she states that they're so small she does not feel like they could be helping.  Anxiety Doing well overall on Lexapro, does have some lightheadedness, however family states that she is doing very well and patient states she has no problem tolerating it. She requests increase in Ativan, she takes 1 mg in night currently to help her sleep. She states that she does not have to take any additional Ativan.  Cough [place] off cough with throat clearing for about one week, no fever, chills, sweats, no shortness of breath. Patient has asthma, she has albuterol available and has not needed this. She uses it maybe once or twice a month.  PMH: Smoking status noted ROS: Per HPI  Objective: BP 126/68   Pulse 67   Temp 97.3 F (36.3 C) (Oral)   Ht '5\' 4"'$  (1.626 m)   Wt 138 lb 3.2 oz (62.7 kg)   BMI 23.72 kg/m  Gen: NAD, alert, cooperative with exam HEENT: NCAT CV: RRR, good S1/S2, no murmur Resp: CTABL, no wheezes, non-labored Ext: No edema, warm Neuro: Alert and oriented, No gross deficits  Assessment and plan:  # Hyperlipidemia Repeat labs, continue Crestor Clinically stable, tolerating statin well  # Anxiety Doing well with Lexapro, no change in dose Refill Ativan, increase dose to 1 mg, she does well with this one pill once a day at night. This is still a good improvement over her previous oxazepam dosing  # Postnasal drip Cough most consistent with postnasal drip mediated cough, continue antihistamine, start Flonase. No signs of asthma exacerbation or severe bacterial infection,  lung exam is very reassuring    Orders Placed This Encounter  Procedures  . CMP14+EGFR  . Lipid panel    Meds ordered this encounter  Medications  . LORazepam (ATIVAN) 1 MG tablet    Sig: Take 1  tablet (1 mg total) by mouth 2 (two) times daily as needed for anxiety.    Dispense:  40 tablet    Refill:  2    Change in dose, please DC remaining 1 mg refills.  . fluticasone (FLONASE) 50 MCG/ACT nasal spray    Sig: Place 2 sprays into both nostrils daily.    Dispense:  16 g    Refill:  [place], MD [facility] 11/04/2016, 10:06 AM

## 2016-11-04 NOTE — Patient Instructions (Signed)
Great to see you!  Start Flonase 2 sprays per nostril once daily  Continue lexapro 1 pill once daily.

## 2016-11-05 LAB — CMP14+EGFR
ALBUMIN: 4.5 g/dL (ref 3.5–4.7)
ALK PHOS: 52 IU/L (ref 39–117)
ALT: 25 IU/L (ref 0–32)
AST: 29 IU/L (ref 0–40)
Albumin/Globulin Ratio: 2.3 — ABNORMAL HIGH (ref 1.2–2.2)
BILIRUBIN TOTAL: 0.4 mg/dL (ref 0.0–1.2)
BUN / CREAT RATIO: 13 (ref 12–28)
BUN: 10 mg/dL (ref 8–27)
CO2: 27 mmol/L (ref 18–29)
CREATININE: 0.75 mg/dL (ref 0.57–1.00)
Calcium: 9.4 mg/dL (ref 8.7–10.3)
Chloride: 98 mmol/L (ref 96–106)
GFR calc Af Amer: 85 mL/min/{1.73_m2} (ref >59)
GFR calc non Af Amer: 74 mL/min/{1.73_m2} (ref >59)
GLUCOSE: 93 mg/dL (ref 65–99)
Globulin, Total: 2 g/dL (ref 1.5–4.5)
POTASSIUM: 4.7 mmol/L (ref 3.5–5.2)
SODIUM: 142 mmol/L (ref 134–144)
Total Protein: 6.5 g/dL (ref 6.0–8.5)

## 2016-11-05 LAB — LIPID PANEL
CHOL/HDL RATIO: 2.7 ratio (ref 0.0–4.4)
CHOLESTEROL TOTAL: 165 mg/dL (ref 100–199)
HDL: 62 mg/dL (ref >39)
LDL CALC: 73 mg/dL (ref 0–99)
TRIGLYCERIDES: 149 mg/dL (ref 0–149)
VLDL Cholesterol Cal: 30 mg/dL (ref 5–40)

## 2017-01-05 DIAGNOSIS — H47323 Drusen of optic disc, bilateral: Secondary | ICD-10-CM | POA: Diagnosis not present

## 2017-01-05 DIAGNOSIS — Z9841 Cataract extraction status, right eye: Secondary | ICD-10-CM | POA: Diagnosis not present

## 2017-01-05 DIAGNOSIS — H04123 Dry eye syndrome of bilateral lacrimal glands: Secondary | ICD-10-CM | POA: Diagnosis not present

## 2017-01-05 DIAGNOSIS — Z9842 Cataract extraction status, left eye: Secondary | ICD-10-CM | POA: Diagnosis not present

## 2017-01-12 ENCOUNTER — Other Ambulatory Visit: Payer: Self-pay | Admitting: Family Medicine

## 2017-02-10 ENCOUNTER — Ambulatory Visit (INDEPENDENT_AMBULATORY_CARE_PROVIDER_SITE_OTHER): Payer: Medicare Other | Admitting: Family Medicine

## 2017-02-10 ENCOUNTER — Encounter: Payer: Self-pay | Admitting: Family Medicine

## 2017-02-10 VITALS — BP 139/63 | HR 62 | Temp 97.0°F | Ht 64.0 in | Wt 146.2 lb

## 2017-02-10 DIAGNOSIS — R059 Cough, unspecified: Secondary | ICD-10-CM

## 2017-02-10 DIAGNOSIS — R05 Cough: Secondary | ICD-10-CM

## 2017-02-10 DIAGNOSIS — E876 Hypokalemia: Secondary | ICD-10-CM | POA: Diagnosis not present

## 2017-02-10 DIAGNOSIS — F419 Anxiety disorder, unspecified: Secondary | ICD-10-CM

## 2017-02-10 MED ORDER — OMEPRAZOLE 20 MG PO CPDR: 20.0000 mg | DELAYED_RELEASE_CAPSULE | Freq: Every day | ORAL | 3 refills | Status: DC

## 2017-02-10 MED ORDER — LORAZEPAM 1 MG PO TABS: 1.0000 mg | ORAL_TABLET | Freq: Two times a day (BID) | ORAL | 2 refills | Status: DC | PRN

## 2017-02-10 MED ORDER — ESCITALOPRAM OXALATE 10 MG PO TABS: 10.0000 mg | ORAL_TABLET | Freq: Every day | ORAL | 3 refills | Status: DC

## 2017-02-10 NOTE — Progress Notes (Signed)
   HPI  Patient presents today here to discuss dizziness, anxiety, and hypokalemia.  Potassium Patient is taking 1 pill once daily for hypokalemia, this is been going on for quite some time. She's concerned about developing high potassium.  Anxiety Doing well with Lexapro, however she does question whether or not it's causing dizziness. Patient has unsteadiness type sensation was first and first getting out of bed. After lengthy discussion we have discovered that this is actually going on for years Patient is taking 1 Ativan once daily and occasionally takes one to 2 pills for anxiety or specific anxiety in situations  Cough Using Claritin, using PPI, also using Flonase. Has been going on for a few weeks. No fever, chills, sweats, no shortness of breath  PMH: Smoking status noted ROS: Per HPI  Objective: BP 139/63   Pulse 62   Temp (!) 97 F (36.1 C) (Oral)   Ht 5\' 4"  (1.626 m)   Wt 146 lb 3.2 oz (66.3 kg)   BMI 25.10 kg/m  Gen: NAD, alert, cooperative with exam HEENT: NCAT, boggy erythematous nasal mucosa, oropharynx with cobblestoning CV: RRR, good S1/S2, no murmur Resp: CTABL, no wheezes, non-labored Ext: No edema, warm Neuro: Alert and oriented, No gross deficits  Assessment and plan:  # Anxiety Doing well with Lexapro plus Ativan I believe the patient's actually improved quite a bit with Lexapro, she is only using one Ativan once daily on most cases and is happy with this.  # Hyperkalemia Previous labs reviewed, potassium normal, renal function normal No medications causing rest for high potassium except for supplementation, she is only taking 10 mEq daily No changes  # Cough Likely multifactorial, consider titrating PPI however symptoms are well controlled on 20 mg. Continue Flonase and Claritin, she does have some signs of allergic rhinitis and postnasal drip Reassurance provided, continue to follow, no signs of serious spectral infection   Meds ordered  this encounter  Medications  . LORazepam (ATIVAN) 1 MG tablet    Sig: Take 1 tablet (1 mg total) by mouth 2 (two) times daily as needed for anxiety.    Dispense:  40 tablet    Refill:  2    Change in dose, please DC remaining 1 mg refills.  Marland Kitchen. escitalopram (LEXAPRO) 10 MG tablet    Sig: Take 1 tablet (10 mg total) by mouth daily.    Dispense:  90 tablet    Refill:  3  . omeprazole (PRILOSEC) 20 MG capsule    Sig: Take 1 capsule (20 mg total) by mouth daily.    Dispense:  90 capsule    Refill:  3    Murtis SinkSam Mariena Meares, MD [facility] 02/10/2017, 10:03 AM

## 2017-02-10 NOTE — Patient Instructions (Signed)
Great to see you!  Come bcak in 3 months unelss you need us sooner.   If you are not taking it, re-start prilosec  continue flonase

## 2017-04-13 ENCOUNTER — Telehealth: Payer: Self-pay

## 2017-04-13 NOTE — Telephone Encounter (Signed)
It is ok to get the flu shot with simple allergies.   We do not recommend it if there is concern for infection.   Murtis Sink, MD [facility] 04/13/2017, 3:25 PM

## 2017-04-13 NOTE — Telephone Encounter (Signed)
Patient said she has allergies and wants to know if its OK to get her flu shot this week

## 2017-04-13 NOTE — Telephone Encounter (Signed)
Patient aware of Dr. Bradshaw's recommendations  

## 2017-04-15 ENCOUNTER — Ambulatory Visit (INDEPENDENT_AMBULATORY_CARE_PROVIDER_SITE_OTHER): Payer: Medicare Other

## 2017-04-15 DIAGNOSIS — Z23 Encounter for immunization: Secondary | ICD-10-CM | POA: Diagnosis not present

## 2017-04-23 ENCOUNTER — Other Ambulatory Visit: Payer: Self-pay | Admitting: Family Medicine

## 2017-04-23 DIAGNOSIS — J45901 Unspecified asthma with (acute) exacerbation: Secondary | ICD-10-CM

## 2017-05-18 ENCOUNTER — Ambulatory Visit (INDEPENDENT_AMBULATORY_CARE_PROVIDER_SITE_OTHER): Payer: Medicare Other

## 2017-05-18 ENCOUNTER — Ambulatory Visit (INDEPENDENT_AMBULATORY_CARE_PROVIDER_SITE_OTHER): Payer: Medicare Other | Admitting: Family Medicine

## 2017-05-18 ENCOUNTER — Encounter: Payer: Self-pay | Admitting: Family Medicine

## 2017-05-18 VITALS — BP 124/70 | HR 78 | Temp 97.7°F | Ht 64.0 in | Wt 153.4 lb

## 2017-05-18 DIAGNOSIS — F419 Anxiety disorder, unspecified: Secondary | ICD-10-CM

## 2017-05-18 DIAGNOSIS — E785 Hyperlipidemia, unspecified: Secondary | ICD-10-CM | POA: Diagnosis not present

## 2017-05-18 DIAGNOSIS — J189 Pneumonia, unspecified organism: Secondary | ICD-10-CM

## 2017-05-18 DIAGNOSIS — R05 Cough: Secondary | ICD-10-CM | POA: Diagnosis not present

## 2017-05-18 DIAGNOSIS — J181 Lobar pneumonia, unspecified organism: Secondary | ICD-10-CM

## 2017-05-18 MED ORDER — LEVOFLOXACIN 750 MG PO TABS: 750.0000 mg | ORAL_TABLET | Freq: Every day | ORAL | 0 refills | Status: DC

## 2017-05-18 MED ORDER — TRIAMCINOLONE ACETONIDE 40 MG/ML IJ SUSP
40.0000 mg | Freq: Once | INTRAMUSCULAR | Status: AC
  Administered 2017-05-18: 40 mg via INTRAMUSCULAR

## 2017-05-18 MED ORDER — ROSUVASTATIN CALCIUM 5 MG PO TABS: 5.0000 mg | ORAL_TABLET | Freq: Every day | ORAL | 3 refills | Status: DC

## 2017-05-18 MED ORDER — LORAZEPAM 1 MG PO TABS: 1.0000 mg | ORAL_TABLET | Freq: Two times a day (BID) | ORAL | 2 refills | Status: DC | PRN

## 2017-05-18 NOTE — Progress Notes (Signed)
   HPI  Patient presents today for follow-up of anxiety, as well as cough.  Cough Patient states she has had cough and wheezing for about 6 weeks.  She has been using over-the-counter Robitussin.  Albuterol does not seem to be helping much. Patient states that over the last few days she has began developing pain in her central upper back between her shoulder blades, especially worse with cough. She does have a history of asthma.  Anxiety Doing well, needs refill of Ativan. Good medication compliance with Lexapro.   Denies any fever, chills, sweats.  She is tolerating food and fluids like usual.   PMH: Smoking status noted ROS: Per HPI  Objective: BP 124/70   Pulse 78   Temp 97.7 F (36.5 C) (Oral)   Ht _0  (1.626 m)   Wt 153 lb 6.4 oz (69.6 kg)   BMI 26.33 kg/m  Gen: NAD, alert, cooperative with exam HEENT: NCAT, EOMI, PERRL CV: RRR, good S1/S2, no murmur Resp: Nonlabored, expiratory rhonchi most notable in the left lower lung field, also coarse breath sounds and wheezing scattered throughout Ext: No edema, warm Neuro: Alert and oriented, No gross deficits  Assessment and plan:  #Community acquired pneumonia Most likely asthma exacerbation with uncontrolled asthma slowly progressing now to what I believe is likely developing pneumonia. Lung exam is concerning, however none labored. She is very comfortable appearing and has good air movement. Plain film is pending, however I believe clinically this is pneumonia. Levaquin, normal renal function. Repeat labs  Also offered her a short course of prednisone versus an injection of Kenalog today, she would like injection which was given.  #Anxiety Doing well on Lexapro plus Ativan, refill Ativan  #Hyperlipidemia Needs refill of Crestor, doing well with that. Labs today, has had coffee creamer this morning   Orders Placed This Encounter  Procedures  . DG Chest 2 View    Standing Status:   Future    Number of  Occurrences:   1    Standing Expiration Date:   07/18/2018    Order Specific Question:   Reason for Exam (SYMPTOM  OR DIAGNOSIS REQUIRED)    Answer:   Cough- Eval for CAP    Order Specific Question:   Preferred imaging location?    Answer:   Internal    Order Specific Question:   Radiology Contrast Protocol - do NOT remove file path    Answer:   \\[email or web address]s.pdf  . CBC with Differential/Platelet  . CMP14+EGFR    Meds ordered this encounter  Medications  . rosuvastatin (CRESTOR) 5 MG tablet    Sig: Take 1 tablet (5 mg total) daily by mouth.    Dispense:  90 tablet    Refill:  3  . LORazepam (ATIVAN) 1 MG tablet    Sig: Take 1 tablet (1 mg total) 2 (two) times daily as needed by mouth for anxiety.    Dispense:  40 tablet    Refill:  2  . levofloxacin (LEVAQUIN) 750 MG tablet    Sig: Take 1 tablet (750 mg total) daily by mouth.    Dispense:  7 tablet    Refill:  0    Laroy Apple, MD [facility] 05/18/2017, 10:18 AM

## 2017-05-18 NOTE — Addendum Note (Signed)
Addended by: Lorelee CoverOSTOSKY, Loye Reininger C on: 05/18/2017 10:22 AM   Modules accepted: Orders

## 2017-05-18 NOTE — Patient Instructions (Signed)
Great to see you!   Community-Acquired Pneumonia, Adult Pneumonia is an infection of the lungs. There are different types of pneumonia. One type can develop while a person is in a hospital. A different type, called community-acquired pneumonia, develops in people who are not, or have not recently been, in the hospital or other health care facility. What are the causes? Pneumonia may be caused by bacteria, viruses, or funguses. Community-acquired pneumonia is often caused by Streptococcus pneumonia bacteria. These bacteria are often passed from one person to another by breathing in droplets from the cough or sneeze of an infected person. What increases the risk? The condition is more likely to develop in:  People who havechronic diseases, such as chronic obstructive pulmonary disease (COPD), asthma, congestive heart failure, cystic fibrosis, diabetes, or kidney disease.  People who haveearly-stage or late-stage HIV.  People who havesickle cell disease.  People who havehad their spleen removed (splenectomy).  People who havepoor dental hygiene.  People who havemedical conditions that increase the risk of breathing in (aspirating) secretions their own mouth and nose.  People who havea weakened immune system (immunocompromised).  People who smoke.  People whotravel to areas where pneumonia-causing germs commonly exist.  People whoare around animal habitats or animals that have pneumonia-causing germs, including birds, bats, rabbits, cats, and farm animals.  What are the signs or symptoms? Symptoms of this condition include:  Adry cough.  A wet (productive) cough.  Fever.  Sweating.  Chest pain, especially when breathing deeply or coughing.  Rapid breathing or difficulty breathing.  Shortness of breath.  Shaking chills.  Fatigue.  Muscle aches.  How is this diagnosed? Your health care provider will take a medical history and perform a physical exam. You may  also have other tests, including:  Imaging studies of your chest, including X-rays.  Tests to check your blood oxygen level and other blood gases.  Other tests on blood, mucus (sputum), fluid around your lungs (pleural fluid), and urine.  If your pneumonia is severe, other tests may be done to identify the specific cause of your illness. How is this treated? The type of treatment that you receive depends on many factors, such as the cause of your pneumonia, the medicines you take, and other medical conditions that you have. For most adults, treatment and recovery from pneumonia may occur at home. In some cases, treatment must happen in a hospital. Treatment may include:  Antibiotic medicines, if the pneumonia was caused by bacteria.  Antiviral medicines, if the pneumonia was caused by a virus.  Medicines that are given by mouth or through an IV tube.  Oxygen.  Respiratory therapy.  Although rare, treating severe pneumonia may include:  Mechanical ventilation. This is done if you are not breathing well on your own and you cannot maintain a safe blood oxygen level.  Thoracentesis. This procedureremoves fluid around one lung or both lungs to help you breathe better.  Follow these instructions at home:  Take over-the-counter and prescription medicines only as told by your health care provider. ? Only takecough medicine if you are losing sleep. Understand that cough medicine can prevent your body's natural ability to remove mucus from your lungs. ? If you were prescribed an antibiotic medicine, take it as told by your health care provider. Do not stop taking the antibiotic even if you start to feel better.  Sleep in a semi-upright position at night. Try sleeping in a reclining chair, or place a few pillows under your head.  Do not   use tobacco products, including cigarettes, chewing tobacco, and e-cigarettes. If you need help quitting, ask your health care provider.  Drink enough  water to keep your urine clear or pale yellow. This will help to thin out mucus secretions in your lungs. How is this prevented? There are ways that you can decrease your risk of developing community-acquired pneumonia. Consider getting a pneumococcal vaccine if:  You are older than 81 years of age.  You are older than 81 years of age and are undergoing cancer treatment, have chronic lung disease, or have other medical conditions that affect your immune system. Ask your health care provider if this applies to you.  There are different types and schedules of pneumococcal vaccines. Ask your health care provider which vaccination option is best for you. You may also prevent community-acquired pneumonia if you take these actions:  Get an influenza vaccine every year. Ask your health care provider which type of influenza vaccine is best for you.  Go to the dentist on a regular basis.  Wash your hands often. Use hand sanitizer if soap and water are not available.  Contact a health care provider if:  You have a fever.  You are losing sleep because you cannot control your cough with cough medicine. Get help right away if:  You have worsening shortness of breath.  You have increased chest pain.  Your sickness becomes worse, especially if you are an older adult or have a weakened immune system.  You cough up blood. This information is not intended to replace advice given to you by your health care provider. Make sure you discuss any questions you have with your health care provider. Document Released: 06/30/2005 Document Revised: 11/08/2015 Document Reviewed: 10/25/2014 Elsevier Interactive Patient Education  2017 [employer]  

## 2017-05-19 LAB — CMP14+EGFR
A/G RATIO: 2 (ref 1.2–2.2)
ALBUMIN: 4.5 g/dL (ref 3.5–4.7)
ALK PHOS: 56 IU/L (ref 39–117)
ALT: 19 IU/L (ref 0–32)
AST: 27 IU/L (ref 0–40)
BILIRUBIN TOTAL: 0.4 mg/dL (ref 0.0–1.2)
BUN / CREAT RATIO: 10 — AB (ref 12–28)
BUN: 9 mg/dL (ref 8–27)
CHLORIDE: 101 mmol/L (ref 96–106)
CO2: 26 mmol/L (ref 20–29)
CREATININE: 0.91 mg/dL (ref 0.57–1.00)
Calcium: 9.5 mg/dL (ref 8.7–10.3)
GFR calc Af Amer: 67 mL/min/{1.73_m2} (ref >59)
GFR calc non Af Amer: 58 mL/min/{1.73_m2} — ABNORMAL LOW (ref >59)
GLOBULIN, TOTAL: 2.3 g/dL (ref 1.5–4.5)
Glucose: 101 mg/dL — ABNORMAL HIGH (ref 65–99)
POTASSIUM: 4.5 mmol/L (ref 3.5–5.2)
SODIUM: 142 mmol/L (ref 134–144)
Total Protein: 6.8 g/dL (ref 6.0–8.5)

## 2017-05-19 LAB — CBC WITH DIFFERENTIAL/PLATELET
Basophils Absolute: 0.1 10*3/uL (ref 0.0–0.2)
Basos: 1 %
EOS (ABSOLUTE): 0.9 10*3/uL — ABNORMAL HIGH (ref 0.0–0.4)
EOS: 9 %
HEMATOCRIT: 40.2 % (ref 34.0–46.6)
HEMOGLOBIN: 13.4 g/dL (ref 11.1–15.9)
Immature Grans (Abs): 0 10*3/uL (ref 0.0–0.1)
Immature Granulocytes: 0 %
LYMPHS ABS: 3 10*3/uL (ref 0.7–3.1)
Lymphs: 31 %
MCH: 32.3 pg (ref 26.6–33.0)
MCHC: 33.3 g/dL (ref 31.5–35.7)
MCV: 97 fL (ref 79–97)
MONOCYTES: 5 %
MONOS ABS: 0.5 10*3/uL (ref 0.1–0.9)
NEUTROS ABS: 5.3 10*3/uL (ref 1.4–7.0)
Neutrophils: 54 %
Platelets: 221 10*3/uL (ref 150–379)
RBC: 4.15 x10E6/uL (ref 3.77–5.28)
RDW: 13 % (ref 12.3–15.4)
WBC: 9.8 10*3/uL (ref 3.4–10.8)

## 2017-07-08 ENCOUNTER — Other Ambulatory Visit: Payer: Self-pay | Admitting: Family Medicine

## 2017-08-18 ENCOUNTER — Ambulatory Visit (INDEPENDENT_AMBULATORY_CARE_PROVIDER_SITE_OTHER): Payer: Medicare Other

## 2017-08-18 ENCOUNTER — Encounter: Payer: Self-pay | Admitting: Family Medicine

## 2017-08-18 ENCOUNTER — Ambulatory Visit (INDEPENDENT_AMBULATORY_CARE_PROVIDER_SITE_OTHER): Payer: Medicare Other | Admitting: Family Medicine

## 2017-08-18 VITALS — BP 123/72 | HR 70 | Temp 97.1°F | Ht 64.0 in | Wt 155.4 lb

## 2017-08-18 DIAGNOSIS — E785 Hyperlipidemia, unspecified: Secondary | ICD-10-CM

## 2017-08-18 DIAGNOSIS — R059 Cough, unspecified: Secondary | ICD-10-CM

## 2017-08-18 DIAGNOSIS — R05 Cough: Secondary | ICD-10-CM | POA: Diagnosis not present

## 2017-08-18 DIAGNOSIS — J4520 Mild intermittent asthma, uncomplicated: Secondary | ICD-10-CM

## 2017-08-18 LAB — LIPID PANEL
CHOLESTEROL TOTAL: 165 mg/dL (ref 100–199)
Chol/HDL Ratio: 2.5 ratio (ref 0.0–4.4)
HDL: 66 mg/dL (ref >39)
LDL CALC: 74 mg/dL (ref 0–99)
TRIGLYCERIDES: 125 mg/dL (ref 0–149)
VLDL CHOLESTEROL CAL: 25 mg/dL (ref 5–40)

## 2017-08-18 LAB — CBC WITH DIFFERENTIAL/PLATELET
BASOS ABS: 0 10*3/uL (ref 0.0–0.2)
BASOS: 0 %
EOS (ABSOLUTE): 0.8 10*3/uL — AB (ref 0.0–0.4)
Eos: 9 %
Hematocrit: 42 % (ref 34.0–46.6)
Hemoglobin: 13.5 g/dL (ref 11.1–15.9)
Immature Grans (Abs): 0 10*3/uL (ref 0.0–0.1)
Immature Granulocytes: 0 %
LYMPHS ABS: 2.7 10*3/uL (ref 0.7–3.1)
LYMPHS: 29 %
MCH: 31.8 pg (ref 26.6–33.0)
MCHC: 32.1 g/dL (ref 31.5–35.7)
MCV: 99 fL — AB (ref 79–97)
MONOS ABS: 0.5 10*3/uL (ref 0.1–0.9)
Monocytes: 6 %
NEUTROS ABS: 5.4 10*3/uL (ref 1.4–7.0)
Neutrophils: 56 %
PLATELETS: 208 10*3/uL (ref 150–379)
RBC: 4.24 x10E6/uL (ref 3.77–5.28)
RDW: 13.6 % (ref 12.3–15.4)
WBC: 9.5 10*3/uL (ref 3.4–10.8)

## 2017-08-18 LAB — CMP14+EGFR
A/G RATIO: 1.9 (ref 1.2–2.2)
ALT: 20 IU/L (ref 0–32)
AST: 25 IU/L (ref 0–40)
Albumin: 4.5 g/dL (ref 3.5–4.7)
Alkaline Phosphatase: 64 IU/L (ref 39–117)
BILIRUBIN TOTAL: 0.5 mg/dL (ref 0.0–1.2)
BUN / CREAT RATIO: 10 — AB (ref 12–28)
BUN: 9 mg/dL (ref 8–27)
CHLORIDE: 103 mmol/L (ref 96–106)
CO2: 24 mmol/L (ref 20–29)
Calcium: 9.4 mg/dL (ref 8.7–10.3)
Creatinine, Ser: 0.87 mg/dL (ref 0.57–1.00)
GFR calc non Af Amer: 61 mL/min/{1.73_m2} (ref >59)
GFR, EST AFRICAN AMERICAN: 71 mL/min/{1.73_m2} (ref >59)
GLOBULIN, TOTAL: 2.4 g/dL (ref 1.5–4.5)
Glucose: 94 mg/dL (ref 65–99)
POTASSIUM: 4.3 mmol/L (ref 3.5–5.2)
SODIUM: 143 mmol/L (ref 134–144)
TOTAL PROTEIN: 6.9 g/dL (ref 6.0–8.5)

## 2017-08-18 MED ORDER — LORAZEPAM 1 MG PO TABS: 1.0000 mg | ORAL_TABLET | Freq: Two times a day (BID) | ORAL | 2 refills | Status: DC | PRN

## 2017-08-18 MED ORDER — BUDESONIDE-FORMOTEROL FUMARATE 160-4.5 MCG/ACT IN AERO: 2.0000 | INHALATION_SPRAY | Freq: Two times a day (BID) | RESPIRATORY_TRACT | 3 refills | Status: DC

## 2017-08-18 NOTE — Patient Instructions (Signed)
Great to see you!   

## 2017-08-18 NOTE — Progress Notes (Signed)
   HPI  Patient presents today here to follow-up for chronic medical conditions.  Anxiety Doing well with Lexapro plus Ativan, patient has reduced her Lexapro to 5 mg and states that she feels better on this. Her daughter is concerned she has increased anxiety with this.  Asthma Patient states that since our last visit she has been struggling with her shortness of breath.  She has had cough and shortness of breath helped by albuterol. Levaquin did not seem to make a difference.  Hyperlipidemia Patient has good medication compliance  PMH: Smoking status noted ROS: Per HPI  Objective: BP 123/72   Pulse 70   Temp (!) 97.1 F (36.2 C) (Oral)   Ht _0  (1.626 m)   Wt 155 lb 6.4 oz (70.5 kg)   SpO2 94%   BMI 26.67 kg/m  Gen: NAD, alert, cooperative with exam HEENT: NCAT CV: RRR, good S1/S2, no murmur Resp: Coarse breath sounds throughout, wheezes scattered throughout, good air movement, nonlabored Ext: No edema, warm Neuro: Alert and oriented, No gross deficits  Assessment and plan:  #Asthma Uncontrolled I do not believe this is a true exacerbation, however I do believe her symptoms are uncontrolled.  Current symptoms since September, 3 months. Symbicort given X-ray to rule out underlying pneumonia, was treated with Levaquin early on  #Hyperlipidemia Labs today Continue statin-Crestor  #Anxiety Well-controlled on Lexapro 5 mg plus Ativan Refill Ativan   Orders Placed This Encounter  Procedures  . DG Chest 2 View    Standing Status:   Future    Number of Occurrences:   1    Standing Expiration Date:   10/17/2018    Order Specific Question:   Reason for Exam (SYMPTOM  OR DIAGNOSIS REQUIRED)    Answer:   cough    Order Specific Question:   Preferred imaging location?    Answer:   Internal    Order Specific Question:   Radiology Contrast Protocol - do NOT remove file path    Answer:   \\[email or web address]\Radiant\DXFluoroContrastProtocols.pdf  . Lipid panel  .  CMP14+EGFR  . CBC with Differential/Platelet    Meds ordered this encounter  Medications  . budesonide-formoterol (SYMBICORT) 160-4.5 MCG/ACT inhaler    Sig: Inhale 2 puffs into the lungs 2 (two) times daily.    Dispense:  1 Inhaler    Refill:  3  . LORazepam (ATIVAN) 1 MG tablet    Sig: Take 1 tablet (1 mg total) by mouth 2 (two) times daily as needed for anxiety.    Dispense:  40 tablet    Refill:  [place], MD [facility] 08/18/2017, 11:48 AM

## 2017-08-20 ENCOUNTER — Telehealth: Payer: Self-pay | Admitting: Family Medicine

## 2017-10-28 ENCOUNTER — Telehealth: Payer: Self-pay | Admitting: Family Medicine

## 2017-10-28 NOTE — Telephone Encounter (Signed)
[facility] this will be worked on in the morning

## 2017-11-17 ENCOUNTER — Encounter: Payer: Self-pay | Admitting: Family Medicine

## 2017-11-17 ENCOUNTER — Ambulatory Visit (INDEPENDENT_AMBULATORY_CARE_PROVIDER_SITE_OTHER): Payer: Medicare Other | Admitting: Family Medicine

## 2017-11-17 VITALS — BP 121/63 | HR 70 | Temp 97.1°F | Ht 64.0 in | Wt 158.8 lb

## 2017-11-17 DIAGNOSIS — F419 Anxiety disorder, unspecified: Secondary | ICD-10-CM

## 2017-11-17 DIAGNOSIS — K219 Gastro-esophageal reflux disease without esophagitis: Secondary | ICD-10-CM | POA: Diagnosis not present

## 2017-11-17 DIAGNOSIS — J4520 Mild intermittent asthma, uncomplicated: Secondary | ICD-10-CM

## 2017-11-17 MED ORDER — BUDESONIDE-FORMOTEROL FUMARATE 160-4.5 MCG/ACT IN AERO: 2.0000 | INHALATION_SPRAY | Freq: Two times a day (BID) | RESPIRATORY_TRACT | 11 refills | Status: DC

## 2017-11-17 MED ORDER — ESCITALOPRAM OXALATE 5 MG PO TABS: 5.0000 mg | ORAL_TABLET | Freq: Every day | ORAL | 3 refills | Status: DC

## 2017-11-17 MED ORDER — OMEPRAZOLE 20 MG PO CPDR: 20.0000 mg | DELAYED_RELEASE_CAPSULE | Freq: Every day | ORAL | 3 refills | Status: DC

## 2017-11-17 MED ORDER — LORAZEPAM 1 MG PO TABS: 1.0000 mg | ORAL_TABLET | Freq: Two times a day (BID) | ORAL | 2 refills | Status: DC | PRN

## 2017-11-17 NOTE — Progress Notes (Signed)
   HPI  Patient presents today here for follow-up chronic medical conditions.  Anxiety Patient states that Lexapro was helpful, however she would like to reduce it to 5 mg as it is causing some dizziness. Needs refill of Ativan.  Prilosec is helping well, however she uses famotidine at times as needed.  She has recently started Symbicort and states that her breathing is better, she would like to continue the medication.  PMH: Smoking status noted ROS: Per HPI  Objective: BP 121/63   Pulse 70   Temp (!) 97.1 F (36.2 C) (Oral)   Ht  (1.626 m)   Wt 158 lb 12.8 oz (72 kg)   BMI 27.26 kg/m  Gen: NAD, alert, cooperative with exam HEENT: NCAT, EOMI, PERRL CV: RRR, good S1/S2, no murmur Resp: CTABL, no wheezes, non-labored Abd: SNTND, BS present, no guarding or organomegaly Ext: No edema, warm Neuro: Alert and oriented, No gross deficits  Assessment and plan:  #Asthma Doing well with Symbicort, refill Controlled  #GERD Controlled overall, patient does use famotidine intermittently for breakthrough symptoms, continue PPI, refill  #Anxiety Doing well with Lexapro having some dizziness, reduce to 5 mg Refill Ativan   Meds ordered this encounter  Medications  . LORazepam (ATIVAN) 1 MG tablet    Sig: Take 1 tablet (1 mg total) by mouth 2 (two) times daily as needed for anxiety.    Dispense:  40 tablet    Refill:  2  . budesonide-formoterol (SYMBICORT) 160-4.5 MCG/ACT inhaler    Sig: Inhale 2 puffs into the lungs 2 (two) times daily.    Dispense:  1 Inhaler    Refill:  11  . escitalopram (LEXAPRO) 5 MG tablet    Sig: Take 1 tablet (5 mg total) by mouth daily.    Dispense:  90 tablet    Refill:  3  . omeprazole (PRILOSEC) 20 MG capsule    Sig: Take 1 capsule (20 mg total) by mouth daily.    Dispense:  90 capsule    Refill:  3    Murtis Sink, MD [facility] 11/17/2017, 12:15 PM

## 2018-02-22 ENCOUNTER — Encounter: Payer: Self-pay | Admitting: Family Medicine

## 2018-02-22 ENCOUNTER — Ambulatory Visit (INDEPENDENT_AMBULATORY_CARE_PROVIDER_SITE_OTHER): Payer: Medicare Other | Admitting: Family Medicine

## 2018-02-22 VITALS — BP 134/72 | HR 78 | Temp 97.3°F | Ht 64.0 in | Wt 159.2 lb

## 2018-02-22 DIAGNOSIS — K219 Gastro-esophageal reflux disease without esophagitis: Secondary | ICD-10-CM | POA: Diagnosis not present

## 2018-02-22 DIAGNOSIS — F419 Anxiety disorder, unspecified: Secondary | ICD-10-CM | POA: Diagnosis not present

## 2018-02-22 DIAGNOSIS — E785 Hyperlipidemia, unspecified: Secondary | ICD-10-CM

## 2018-02-22 MED ORDER — LORAZEPAM 1 MG PO TABS: 1.0000 mg | ORAL_TABLET | Freq: Two times a day (BID) | ORAL | 2 refills | Status: DC | PRN

## 2018-02-22 NOTE — Progress Notes (Signed)
BP 134/72   Pulse 78   Temp (!) 97.3 F (36.3 C) (Oral)   Ht 5' 4"  (1.626 m)   Wt 159 lb 3.2 oz (72.2 kg)   BMI 27.33 kg/m    Subjective:    Patient ID: Mikayla Wilson, female    DOB: 22-May-1931, 84 y.o.   MRN: [id number]  HPI: Mikayla Wilson is a 84 y.o. female presenting on 02/22/2018 for Hyperlipidemia (3 month follow up) and Allergies   HPI Hyperlipidemia Patient is coming in for recheck of his hyperlipidemia. The patient is currently taking Crestor 5. They deny any issues with myalgias or history of liver damage from it. They deny any focal numbness or weakness or chest pain.   GERD Patient is currently on omeprazole.  She denies any major symptoms or abdominal pain or belching or burping. She denies any blood in her stool or lightheadedness or dizziness.   Anxiety Patient is coming in for anxiety recheck as well.  She currently takes Lexapro 5 and that helps with anxiety and then she uses the Ativan as needed, mostly at bedtime to help her sleep.  She denies any suicidal ideations or major thoughts of hurting herself.  She has been doing relatively very well. Depression screen [facility] 2/9 02/22/2018 11/17/2017 08/18/2017 05/18/2017 02/10/2017  Decreased Interest 0 0 0 0 0  Down, Depressed, Hopeless 0 0 0 0 0  PHQ - 2 Score 0 0 0 0 0  Altered sleeping - - - - -  Tired, decreased energy - - - - -  Change in appetite - - - - -  Feeling bad or failure about yourself  - - - - -  Trouble concentrating - - - - -  Moving slowly or fidgety/restless - - - - -  Suicidal thoughts - - - - -  PHQ-9 Score - - - - -     Relevant past medical, surgical, family and social history reviewed and updated as indicated. Interim medical history since our last visit reviewed. Allergies and medications reviewed and updated.  Review of Systems  Constitutional: Negative for chills and fever.  Eyes: Negative for visual disturbance.  Respiratory: Negative for chest tightness and shortness of breath.    Cardiovascular: Negative for chest pain and leg swelling.  Musculoskeletal: Negative for back pain and gait problem.  Skin: Negative for rash.  Neurological: Negative for light-headedness and headaches.  Psychiatric/Behavioral: Negative for agitation, behavioral problems, dysphoric mood, self-injury, sleep disturbance and suicidal ideas. The patient is nervous/anxious.   All other systems reviewed and are negative.   Per HPI unless specifically indicated above   Allergies as of 02/22/2018      Reactions   Aspirin Other (See Comments)   "My doctor told me to never take aspirin"   Morphine And Related Other (See Comments)      Medication List        Accurate as of 02/22/18 11:15 AM. Always use your most recent med list.          acetaminophen 325 MG tablet Commonly known as:  TYLENOL Take 2 tablets (650 mg total) by mouth every 6 (six) hours as needed for mild pain (or Fever >/= 101).   alum & mag hydroxide-simeth 200-200-20 MG/5ML suspension Commonly known as:  MAALOX/MYLANTA Take 30 mLs by mouth every 4 (four) hours as needed for indigestion.   budesonide-formoterol 160-4.5 MCG/ACT inhaler Commonly known as:  SYMBICORT Inhale 2 puffs into the lungs 2 (two) times daily.  COUGH SYRUP PO Take 15 mLs by mouth daily as needed (cough).   ENSURE NUTRITION SHAKE PO Take 1-2 Bottles by mouth daily. Reported on 11/30/2015   escitalopram 5 MG tablet Commonly known as:  LEXAPRO Take 1 tablet (5 mg total) by mouth daily.   fluticasone 50 MCG/ACT nasal spray Commonly known as:  FLONASE Place 2 sprays into both nostrils daily.   ketotifen 0.025 % ophthalmic solution Commonly known as:  ZADITOR Place 1 drop into both eyes 6 (six) times daily.   loratadine 10 MG tablet Commonly known as:  CLARITIN TAKE 1 TABLET ONCE DAILY AS NEEDED FOR ALLERGY   LORazepam 1 MG tablet Commonly known as:  ATIVAN Take 1 tablet (1 mg total) by mouth 2 (two) times daily as needed for  anxiety.   omeprazole 20 MG capsule Commonly known as:  PRILOSEC Take 1 capsule (20 mg total) by mouth daily.   rosuvastatin 5 MG tablet Commonly known as:  CRESTOR Take 1 tablet (5 mg total) daily by mouth.   SALINE NASAL MIST NA Place 1 spray into the nose 2 (two) times daily.   VENTOLIN HFA 108 (90 Base) MCG/ACT inhaler Generic drug:  albuterol 2 PUFFS EVERY 6 HOURS AS NEEDED FOR WHEEZING OR SHORTNESS OF BREATH   VITAMIN D PO Take 1 tablet by mouth daily.          Objective:    BP 134/72   Pulse 78   Temp (!) 97.3 F (36.3 C) (Oral)   Ht 5' 4"  (1.626 m)   Wt 159 lb 3.2 oz (72.2 kg)   BMI 27.33 kg/m   Wt Readings from Last 3 Encounters:  02/22/18 159 lb 3.2 oz (72.2 kg)  11/17/17 158 lb 12.8 oz (72 kg)  08/18/17 155 lb 6.4 oz (70.5 kg)    Physical Exam  Constitutional: She is oriented to person, place, and time. She appears well-developed and well-nourished. No distress.  Eyes: Pupils are equal, round, and reactive to light. Conjunctivae and EOM are normal.  Cardiovascular: Normal rate, regular rhythm, normal heart sounds and intact distal pulses.  No murmur heard. Pulmonary/Chest: Effort normal and breath sounds normal. No respiratory distress. She has no wheezes.  Musculoskeletal: Normal range of motion. She exhibits no edema.  Neurological: She is alert and oriented to person, place, and time. Coordination normal.  Skin: Skin is warm and dry. No rash noted. She is not diaphoretic.  Psychiatric: Her behavior is normal. Her mood appears anxious. She does not exhibit a depressed mood. She expresses no suicidal ideation. She expresses no suicidal plans.  Nursing note and vitals reviewed.       Assessment & Plan:   Problem List Items Addressed This Visit      Digestive   GERD (gastroesophageal reflux disease) - Primary   Relevant Orders   CMP14+EGFR     Other   Anxiety   Relevant Medications   LORazepam (ATIVAN) 1 MG tablet   HLD (hyperlipidemia)    Relevant Orders   Lipid panel       Follow up plan: Return in about 3 months (around 05/25/2018), or if symptoms worsen or fail to improve, for Anxiety.  Counseling provided for all of the vaccine components Orders Placed This Encounter  Procedures  . CMP14+EGFR  . Lipid panel    Caryl Pina, MD [facility] 02/22/2018, 11:15 AM

## 2018-02-23 LAB — CMP14+EGFR
ALK PHOS: 72 IU/L (ref 39–117)
ALT: 23 IU/L (ref 0–32)
AST: 32 IU/L (ref 0–40)
Albumin/Globulin Ratio: 2 (ref 1.2–2.2)
Albumin: 4.5 g/dL (ref 3.5–4.7)
BUN/Creatinine Ratio: 10 — ABNORMAL LOW (ref 12–28)
BUN: 9 mg/dL (ref 8–27)
Bilirubin Total: 0.3 mg/dL (ref 0.0–1.2)
CALCIUM: 9.4 mg/dL (ref 8.7–10.3)
CO2: 23 mmol/L (ref 20–29)
CREATININE: 0.9 mg/dL (ref 0.57–1.00)
Chloride: 104 mmol/L (ref 96–106)
GFR calc Af Amer: 68 mL/min/{1.73_m2} (ref >59)
GFR, EST NON AFRICAN AMERICAN: 59 mL/min/{1.73_m2} — AB (ref >59)
GLOBULIN, TOTAL: 2.2 g/dL (ref 1.5–4.5)
GLUCOSE: 126 mg/dL — AB (ref 65–99)
Potassium: 4.2 mmol/L (ref 3.5–5.2)
SODIUM: 144 mmol/L (ref 134–144)
Total Protein: 6.7 g/dL (ref 6.0–8.5)

## 2018-02-23 LAB — LIPID PANEL
CHOL/HDL RATIO: 2.4 ratio (ref 0.0–4.4)
CHOLESTEROL TOTAL: 145 mg/dL (ref 100–199)
HDL: 60 mg/dL (ref >39)
LDL CALC: 52 mg/dL (ref 0–99)
TRIGLYCERIDES: 166 mg/dL — AB (ref 0–149)
VLDL CHOLESTEROL CAL: 33 mg/dL (ref 5–40)

## 2018-02-24 ENCOUNTER — Telehealth: Payer: Self-pay | Admitting: Family Medicine

## 2018-02-24 NOTE — Telephone Encounter (Signed)
Aware. 

## 2018-03-04 ENCOUNTER — Other Ambulatory Visit: Payer: Self-pay | Admitting: *Deleted

## 2018-03-04 MED ORDER — LORATADINE 10 MG PO TABS: ORAL_TABLET | ORAL | 5 refills | Status: DC

## 2018-03-04 MED ORDER — ESCITALOPRAM OXALATE 5 MG PO TABS: 5.0000 mg | ORAL_TABLET | Freq: Every day | ORAL | 3 refills | Status: DC

## 2018-03-04 MED ORDER — FLUTICASONE PROPIONATE 50 MCG/ACT NA SUSP: 2.0000 | Freq: Every day | NASAL | 5 refills | Status: DC

## 2018-04-19 ENCOUNTER — Ambulatory Visit (INDEPENDENT_AMBULATORY_CARE_PROVIDER_SITE_OTHER): Payer: Medicare Other

## 2018-04-19 DIAGNOSIS — Z23 Encounter for immunization: Secondary | ICD-10-CM

## 2018-05-26 ENCOUNTER — Ambulatory Visit (INDEPENDENT_AMBULATORY_CARE_PROVIDER_SITE_OTHER): Payer: Medicare Other | Admitting: Family Medicine

## 2018-05-26 ENCOUNTER — Encounter: Payer: Self-pay | Admitting: Family Medicine

## 2018-05-26 VITALS — BP 136/73 | HR 72 | Temp 96.8°F | Ht 64.0 in | Wt 154.6 lb

## 2018-05-26 DIAGNOSIS — K219 Gastro-esophageal reflux disease without esophagitis: Secondary | ICD-10-CM

## 2018-05-26 DIAGNOSIS — E785 Hyperlipidemia, unspecified: Secondary | ICD-10-CM

## 2018-05-26 DIAGNOSIS — F419 Anxiety disorder, unspecified: Secondary | ICD-10-CM

## 2018-05-26 MED ORDER — PREDNISONE 20 MG PO TABS: ORAL_TABLET | ORAL | 0 refills | Status: DC

## 2018-05-26 MED ORDER — BUDESONIDE-FORMOTEROL FUMARATE 160-4.5 MCG/ACT IN AERO: 2.0000 | INHALATION_SPRAY | Freq: Two times a day (BID) | RESPIRATORY_TRACT | 11 refills | Status: DC

## 2018-05-26 MED ORDER — ROSUVASTATIN CALCIUM 5 MG PO TABS: 5.0000 mg | ORAL_TABLET | Freq: Every day | ORAL | 3 refills | Status: DC

## 2018-05-26 MED ORDER — LORAZEPAM 1 MG PO TABS: 1.0000 mg | ORAL_TABLET | Freq: Two times a day (BID) | ORAL | 2 refills | Status: DC | PRN

## 2018-05-26 NOTE — Progress Notes (Signed)
BP 136/73   Pulse 72   Temp (!) 96.8 F (36 C) (Oral)   Ht 5\' 4"  (1.626 m)   Wt 154 lb 9.6 oz (70.1 kg)   BMI 26.54 kg/m    Subjective:    Patient ID: Mikayla Wilson, female    DOB: 02-08-1931, 84 y.o.   MRN: [id number]  HPI: Mikayla LampJosephine M Wilson is a 84 y.o. female presenting on 05/26/2018 for Hyperlipidemia (3 month follow up) and Dizziness (Patinet states that it has been ongoing but has gotten worse.)   HPI Hyperlipidemia Patient is coming in for recheck of his hyperlipidemia. The patient is currently taking Crestor. They deny any issues with myalgias or history of liver damage from it. They deny any focal numbness or weakness or chest pain.   GERD Patient is currently on omeprazole.  She denies any major symptoms or abdominal pain or belching or burping. She denies any blood in her stool or lightheadedness or dizziness.  Anxiety and insomnia Patient is currently using lorazepam 1 mg daily, mostly at bedtime and she also uses Lexapro for anxiety.  She says that she is satisfied with where she is at and they are working for her.  She denies any major issues with anxiety and wants to continue with the medications.  She denies any suicidal ideations or thoughts of hurting herself.  Patient's caretaker is mostly by the daughter because she does have a mild amount of dementia.  Relevant past medical, surgical, family and social history reviewed and updated as indicated. Interim medical history since our last visit reviewed. Allergies and medications reviewed and updated.  Review of Systems  Constitutional: Negative for chills and fever.  Eyes: Negative for visual disturbance.  Respiratory: Negative for chest tightness and shortness of breath.   Cardiovascular: Negative for chest pain and leg swelling.  Gastrointestinal: Negative for abdominal pain.  Musculoskeletal: Negative for back pain and gait problem.  Skin: Negative for rash.  Neurological: Negative for light-headedness  and headaches.  Psychiatric/Behavioral: Positive for dysphoric mood. Negative for agitation, behavioral problems, self-injury, sleep disturbance and suicidal ideas. The patient is nervous/anxious.   All other systems reviewed and are negative.   Per HPI unless specifically indicated above   Allergies as of 05/26/2018      Reactions   Aspirin Other (See Comments)   "My doctor told me to never take aspirin"   Morphine And Related Other (See Comments)      Medication List        Accurate as of 05/26/18 11:04 AM. Always use your most recent med list.          acetaminophen 325 MG tablet Commonly known as:  TYLENOL Take 2 tablets (650 mg total) by mouth every 6 (six) hours as needed for mild pain (or Fever >/= 101).   alum & mag hydroxide-simeth 200-200-20 MG/5ML suspension Commonly known as:  MAALOX/MYLANTA Take 30 mLs by mouth every 4 (four) hours as needed for indigestion.   budesonide-formoterol 160-4.5 MCG/ACT inhaler Commonly known as:  SYMBICORT Inhale 2 puffs into the lungs 2 (two) times daily.   ENSURE NUTRITION SHAKE PO Take 1-2 Bottles by mouth daily. Reported on 11/30/2015   escitalopram 5 MG tablet Commonly known as:  LEXAPRO Take 1 tablet (5 mg total) by mouth daily.   fluticasone 50 MCG/ACT nasal spray Commonly known as:  FLONASE Place 2 sprays into both nostrils daily.   ketotifen 0.025 % ophthalmic solution Commonly known as:  ZADITOR Place 1 drop  into both eyes 6 (six) times daily.   loratadine 10 MG tablet Commonly known as:  CLARITIN TAKE 1 TABLET ONCE DAILY AS NEEDED FOR ALLERGY   LORazepam 1 MG tablet Commonly known as:  ATIVAN Take 1 tablet (1 mg total) by mouth 2 (two) times daily as needed for anxiety.   omeprazole 20 MG capsule Commonly known as:  PRILOSEC Take 1 capsule (20 mg total) by mouth daily.   predniSONE 20 MG tablet Commonly known as:  DELTASONE 2 po at same time daily for 5 days   rosuvastatin 5 MG tablet Commonly known  as:  CRESTOR Take 1 tablet (5 mg total) by mouth daily.   SALINE NASAL MIST NA Place 1 spray into the nose 2 (two) times daily.   VENTOLIN HFA 108 (90 Base) MCG/ACT inhaler Generic drug:  albuterol 2 PUFFS EVERY 6 HOURS AS NEEDED FOR WHEEZING OR SHORTNESS OF BREATH   VITAMIN D PO Take 1 tablet by mouth daily.          Objective:    BP 136/73   Pulse 72   Temp (!) 96.8 F (36 C) (Oral)   Ht 5\' 4"  (1.626 m)   Wt 154 lb 9.6 oz (70.1 kg)   BMI 26.54 kg/m   Wt Readings from Last 3 Encounters:  05/26/18 154 lb 9.6 oz (70.1 kg)  02/22/18 159 lb 3.2 oz (72.2 kg)  11/17/17 158 lb 12.8 oz (72 kg)    Physical Exam  Constitutional: She is oriented to person, place, and time. She appears well-developed and well-nourished. No distress.  Eyes: Conjunctivae are normal.  Neck: Neck supple. No thyromegaly present.  Cardiovascular: Normal rate, regular rhythm, normal heart sounds and intact distal pulses.  No murmur heard. Pulmonary/Chest: Effort normal and breath sounds normal. No respiratory distress. She has no wheezes.  Musculoskeletal: Normal range of motion. She exhibits no edema.  Lymphadenopathy:    She has no cervical adenopathy.  Neurological: She is alert and oriented to person, place, and time. Coordination normal.  Skin: Skin is warm and dry. No rash noted. She is not diaphoretic.  Psychiatric: She has a normal mood and affect. Her behavior is normal.  Nursing note and vitals reviewed.       Assessment & Plan:   Problem List Items Addressed This Visit      Digestive   GERD (gastroesophageal reflux disease)     Other   Anxiety - Primary   Relevant Medications   LORazepam (ATIVAN) 1 MG tablet   HLD (hyperlipidemia)   Relevant Medications   rosuvastatin (CRESTOR) 5 MG tablet       Follow up plan: Return in about 3 months (around 08/26/2018), or if symptoms worsen or fail to improve, for Anxiety and hyperlipidemia recheck.  Counseling provided for all of the  vaccine components No orders of the defined types were placed in this encounter.   Arville Care, MD [facility] 05/26/2018, 11:04 AM

## 2018-07-08 ENCOUNTER — Other Ambulatory Visit: Payer: Self-pay | Admitting: *Deleted

## 2018-07-08 MED ORDER — LORAZEPAM 1 MG PO TABS: 1.0000 mg | ORAL_TABLET | Freq: Two times a day (BID) | ORAL | 1 refills | Status: DC | PRN

## 2018-07-08 MED ORDER — ESCITALOPRAM OXALATE 5 MG PO TABS: 5.0000 mg | ORAL_TABLET | Freq: Every day | ORAL | 2 refills | Status: DC

## 2018-07-08 MED ORDER — OMEPRAZOLE 20 MG PO CPDR: 20.0000 mg | DELAYED_RELEASE_CAPSULE | Freq: Every day | ORAL | 2 refills | Status: DC

## 2018-07-08 MED ORDER — FLUTICASONE PROPIONATE 50 MCG/ACT NA SUSP: 2.0000 | Freq: Every day | NASAL | 1 refills | Status: DC

## 2018-07-08 MED ORDER — BUDESONIDE-FORMOTEROL FUMARATE 160-4.5 MCG/ACT IN AERO: 2.0000 | INHALATION_SPRAY | Freq: Two times a day (BID) | RESPIRATORY_TRACT | 10 refills | Status: DC

## 2018-07-08 MED ORDER — ROSUVASTATIN CALCIUM 5 MG PO TABS: 5.0000 mg | ORAL_TABLET | Freq: Every day | ORAL | 2 refills | Status: DC

## 2018-07-08 MED ORDER — LORATADINE 10 MG PO TABS: ORAL_TABLET | ORAL | 5 refills | Status: DC

## 2018-07-08 NOTE — Telephone Encounter (Signed)
[facility] Please send remaining script for Lorazepam Famotidine is not on current med list last RF was done 07/08/17 for #60 with 4 RFs Please advise on this request

## 2018-07-08 NOTE — Addendum Note (Signed)
Addended by: Julious PayerHOLT, Trayquan Kolakowski D on: 07/08/2018 09:24 AM   Modules accepted: Orders

## 2018-08-30 ENCOUNTER — Ambulatory Visit (INDEPENDENT_AMBULATORY_CARE_PROVIDER_SITE_OTHER): Payer: Medicare Other | Admitting: Family Medicine

## 2018-08-30 ENCOUNTER — Encounter: Payer: Self-pay | Admitting: Family Medicine

## 2018-08-30 ENCOUNTER — Telehealth: Payer: Self-pay | Admitting: Family Medicine

## 2018-08-30 VITALS — BP 127/73 | HR 74 | Temp 97.0°F | Ht 64.0 in | Wt 153.0 lb

## 2018-08-30 DIAGNOSIS — F419 Anxiety disorder, unspecified: Secondary | ICD-10-CM | POA: Diagnosis not present

## 2018-08-30 DIAGNOSIS — E782 Mixed hyperlipidemia: Secondary | ICD-10-CM | POA: Diagnosis not present

## 2018-08-30 DIAGNOSIS — K219 Gastro-esophageal reflux disease without esophagitis: Secondary | ICD-10-CM | POA: Diagnosis not present

## 2018-08-30 MED ORDER — LORAZEPAM 1 MG PO TABS: 1.0000 mg | ORAL_TABLET | Freq: Two times a day (BID) | ORAL | 1 refills | Status: DC | PRN

## 2018-08-30 MED ORDER — LORATADINE 10 MG PO TABS: ORAL_TABLET | ORAL | 3 refills | Status: DC

## 2018-08-30 MED ORDER — BUDESONIDE-FORMOTEROL FUMARATE 160-4.5 MCG/ACT IN AERO: 2.0000 | INHALATION_SPRAY | Freq: Two times a day (BID) | RESPIRATORY_TRACT | 10 refills | Status: DC

## 2018-08-30 MED ORDER — OMEPRAZOLE 20 MG PO CPDR: 20.0000 mg | DELAYED_RELEASE_CAPSULE | Freq: Every day | ORAL | 3 refills | Status: DC

## 2018-08-30 MED ORDER — FAMOTIDINE 20 MG PO TABS: 20.0000 mg | ORAL_TABLET | Freq: Two times a day (BID) | ORAL | 3 refills | Status: DC

## 2018-08-30 NOTE — Progress Notes (Signed)
BP 127/73   Pulse 74   Temp (!) 97 F (36.1 C) (Oral)   Ht 5\' 4"  (1.626 Wilson)   Wt 153 lb (69.4 kg)   BMI 26.26 kg/Wilson    Subjective:    Patient ID: Mikayla Wilson Luppino, female    DOB: [birth year], 85 y.o.   MRN: [id number]  HPI: Mikayla Wilson Hacker is a 85 y.o. female presenting on 08/30/2018 for Anxiety (3 month follow up) and Hyperlipidemia   HPI Hyperlipidemia Patient is coming in for recheck of his hyperlipidemia. The patient is currently taking Crestor. They deny any issues with myalgias or history of liver damage from it. They deny any focal numbness or weakness or chest pain.   GERD Patient is currently on omeprazole.  She denies any major symptoms or abdominal pain or belching or burping. She denies any blood in her stool or lightheadedness or dizziness.   Anxiety recheck Patient is coming in for an anxiety recheck.  She is currently on Lexapro and lorazepam and has been using this consistently for some time and denies any major issues with it.  She is brought in here by her daughter as well who says she is doing well on these.  She denies any suicidal ideations or thoughts of hurting self.  She says her anxiety has been controlled on these medications. Depression screen [facility] 2/9 08/30/2018 05/26/2018 02/22/2018 11/17/2017 08/18/2017  Decreased Interest 0 0 0 0 0  Down, Depressed, Hopeless 0 0 0 0 0  PHQ - 2 Score 0 0 0 0 0  Altered sleeping - - - - -  Tired, decreased energy - - - - -  Change in appetite - - - - -  Feeling bad or failure about yourself  - - - - -  Trouble concentrating - - - - -  Moving slowly or fidgety/restless - - - - -  Suicidal thoughts - - - - -  PHQ-9 Score - - - - -     Relevant past medical, surgical, family and social history reviewed and updated as indicated. Interim medical history since our last visit reviewed. Allergies and medications reviewed and updated.  Review of Systems  Constitutional: Negative for chills and fever.  Eyes: Negative for  visual disturbance.  Respiratory: Negative for chest tightness and shortness of breath.   Cardiovascular: Negative for chest pain and leg swelling.  Musculoskeletal: Negative for back pain and gait problem.  Skin: Negative for rash.  Neurological: Negative for light-headedness and headaches.  Psychiatric/Behavioral: Positive for dysphoric mood. Negative for agitation, behavioral problems, self-injury, sleep disturbance and suicidal ideas. The patient is nervous/anxious.   All other systems reviewed and are negative.  Per HPI unless specifically indicated above   Allergies as of 08/30/2018      Reactions   Aspirin Other (See Comments)   "My doctor told me to never take aspirin"   Morphine And Related Other (See Comments)      Medication List       Accurate as of August 30, 2018  1:13 PM. Always use your most recent med list.        acetaminophen 325 MG tablet Commonly known as:  TYLENOL Take 2 tablets (650 mg total) by mouth every 6 (six) hours as needed for mild pain (or Fever >/= 101).   alum & mag hydroxide-simeth 200-200-20 MG/5ML suspension Commonly known as:  MAALOX/MYLANTA Take 30 mLs by mouth every 4 (four) hours as needed for indigestion.   budesonide-formoterol 160-4.5  MCG/ACT inhaler Commonly known as:  SYMBICORT Inhale 2 puffs into the lungs 2 (two) times daily.   ENSURE NUTRITION SHAKE PO Take 1-2 Bottles by mouth daily. Reported on 11/30/2015   escitalopram 5 MG tablet Commonly known as:  LEXAPRO Take 1 tablet (5 mg total) by mouth daily.   fluticasone 50 MCG/ACT nasal spray Commonly known as:  FLONASE Place 2 sprays into both nostrils daily.   ketotifen 0.025 % ophthalmic solution Commonly known as:  ZADITOR Place 1 drop into both eyes 6 (six) times daily.   loratadine 10 MG tablet Commonly known as:  CLARITIN TAKE 1 TABLET ONCE DAILY AS NEEDED FOR ALLERGY   LORazepam 1 MG tablet Commonly known as:  ATIVAN Take 1 tablet (1 mg total) by mouth 2  (two) times daily as needed for anxiety.   omeprazole 20 MG capsule Commonly known as:  PRILOSEC Take 1 capsule (20 mg total) by mouth daily.   rosuvastatin 5 MG tablet Commonly known as:  CRESTOR Take 1 tablet (5 mg total) by mouth daily.   SALINE NASAL MIST NA Place 1 spray into the nose 2 (two) times daily.   VENTOLIN HFA 108 (90 Base) MCG/ACT inhaler Generic drug:  albuterol 2 PUFFS EVERY 6 HOURS AS NEEDED FOR WHEEZING OR SHORTNESS OF BREATH   VITAMIN D PO Take 1 tablet by mouth daily.          Objective:    BP 127/73   Pulse 74   Temp (!) 97 F (36.1 C) (Oral)   Ht 5\' 4"  (1.626 Wilson)   Wt 153 lb (69.4 kg)   BMI 26.26 kg/Wilson   Wt Readings from Last 3 Encounters:  08/30/18 153 lb (69.4 kg)  05/26/18 154 lb 9.6 oz (70.1 kg)  02/22/18 159 lb 3.2 oz (72.2 kg)    Physical Exam Vitals signs and nursing note reviewed.  Constitutional:      General: She is not in acute distress.    Appearance: She is well-developed. She is not diaphoretic.  Eyes:     Conjunctiva/sclera: Conjunctivae normal.  Cardiovascular:     Rate and Rhythm: Normal rate and regular rhythm.     Heart sounds: Normal heart sounds. No murmur.  Pulmonary:     Effort: Pulmonary effort is normal. No respiratory distress.     Breath sounds: Normal breath sounds. No wheezing.  Musculoskeletal: Normal range of motion.        General: No tenderness.  Skin:    General: Skin is warm and dry.     Findings: No rash.  Neurological:     Mental Status: She is alert and oriented to person, place, and time.     Coordination: Coordination normal.  Psychiatric:        Mood and Affect: Mood is anxious. Mood is not depressed.        Behavior: Behavior normal.        Thought Content: Thought content does not include suicidal ideation. Thought content does not include suicidal plan.        Assessment & Plan:   Problem List Items Addressed This Visit      Digestive   GERD (gastroesophageal reflux disease)    Relevant Medications   omeprazole (PRILOSEC) 20 MG capsule   Other Relevant Orders   CBC with Differential/Platelet     Other   Anxiety   Relevant Medications   LORazepam (ATIVAN) 1 MG tablet   HLD (hyperlipidemia) - Primary   Relevant Orders  Lipid panel       Follow up plan: Return if symptoms worsen or fail to improve, for Anxiety and GERD and hyperlipidemia.  Counseling provided for all of the vaccine components Orders Placed This Encounter  Procedures  . Lipid panel  . CBC with Differential/Platelet    Arville Care, MD [facility] 08/30/2018, 1:13 PM

## 2018-08-30 NOTE — Telephone Encounter (Signed)
I sent the Pepcid in for her

## 2018-08-31 LAB — CBC WITH DIFFERENTIAL/PLATELET
Basophils Absolute: 0.1 10*3/uL (ref 0.0–0.2)
Basos: 1 %
EOS (ABSOLUTE): 0.4 10*3/uL (ref 0.0–0.4)
Eos: 5 %
Hematocrit: 37.9 % (ref 34.0–46.6)
Hemoglobin: 12.9 g/dL (ref 11.1–15.9)
Immature Grans (Abs): 0 10*3/uL (ref 0.0–0.1)
Immature Granulocytes: 0 %
Lymphocytes Absolute: 2.5 10*3/uL (ref 0.7–3.1)
Lymphs: 29 %
MCH: 32.7 pg (ref 26.6–33.0)
MCHC: 34 g/dL (ref 31.5–35.7)
MCV: 96 fL (ref 79–97)
Monocytes Absolute: 0.5 10*3/uL (ref 0.1–0.9)
Monocytes: 6 %
Neutrophils Absolute: 5.2 10*3/uL (ref 1.4–7.0)
Neutrophils: 59 %
Platelets: 177 10*3/uL (ref 150–450)
RBC: 3.95 x10E6/uL (ref 3.77–5.28)
RDW: 12.4 % (ref 11.7–15.4)
WBC: 8.6 10*3/uL (ref 3.4–10.8)

## 2018-08-31 LAB — LIPID PANEL
Chol/HDL Ratio: 2.2 ratio (ref 0.0–4.4)
Cholesterol, Total: 128 mg/dL (ref 100–199)
HDL: 59 mg/dL (ref >39)
LDL Calculated: 47 mg/dL (ref 0–99)
Triglycerides: 109 mg/dL (ref 0–149)
VLDL Cholesterol Cal: 22 mg/dL (ref 5–40)

## 2018-10-21 ENCOUNTER — Other Ambulatory Visit: Payer: Self-pay | Admitting: Family Medicine

## 2018-10-21 NOTE — Telephone Encounter (Signed)
Please schedule for a phone visit on Monday

## 2018-10-25 ENCOUNTER — Other Ambulatory Visit: Payer: Self-pay

## 2018-10-25 NOTE — Telephone Encounter (Signed)
Patient needs to be set up for telephone visit

## 2018-10-25 NOTE — Telephone Encounter (Signed)
Please call our office to schedule a telephone visit with your provider to obtain refills on the lorazepam.

## 2018-10-25 NOTE — Telephone Encounter (Signed)
Last seen 08/30/2018

## 2018-12-01 ENCOUNTER — Ambulatory Visit (INDEPENDENT_AMBULATORY_CARE_PROVIDER_SITE_OTHER): Payer: Medicare HMO | Admitting: Family Medicine

## 2018-12-01 ENCOUNTER — Other Ambulatory Visit: Payer: Self-pay

## 2018-12-01 ENCOUNTER — Encounter: Payer: Self-pay | Admitting: Family Medicine

## 2018-12-01 DIAGNOSIS — F419 Anxiety disorder, unspecified: Secondary | ICD-10-CM | POA: Diagnosis not present

## 2018-12-01 DIAGNOSIS — E782 Mixed hyperlipidemia: Secondary | ICD-10-CM | POA: Diagnosis not present

## 2018-12-01 DIAGNOSIS — J4520 Mild intermittent asthma, uncomplicated: Secondary | ICD-10-CM

## 2018-12-01 MED ORDER — FLUTICASONE-SALMETEROL 100-50 MCG/DOSE IN AEPB: 1.0000 | INHALATION_SPRAY | Freq: Two times a day (BID) | RESPIRATORY_TRACT | 3 refills | Status: DC

## 2018-12-01 MED ORDER — LORAZEPAM 1 MG PO TABS: 1.0000 mg | ORAL_TABLET | Freq: Two times a day (BID) | ORAL | 2 refills | Status: DC | PRN

## 2018-12-01 NOTE — Progress Notes (Signed)
Virtual Visit via telephone Note  I connected with Mikayla Wilson on 12/01/18 at 1059 by telephone and verified that I am speaking with the correct person using two identifiers. Mikayla Wilson is currently located at home and daughter Mikayla Wilson are currently with her during visit. The provider, Elige Radon Nial Hawe, MD is located in their office at time of visit.  Call ended at 1108  I discussed the limitations, risks, security and privacy concerns of performing an evaluation and management service by telephone and the availability of in person appointments. I also discussed with the patient that there may be a patient responsible charge related to this service. The patient expressed understanding and agreed to proceed.   History and Present Illness: COPD Patient is coming in for COPD recheck today.  He is currently on Symbicort but says her insurance is not covering it.  He has a mild chronic cough but denies any major coughing spells or wheezing spells.  He has 1nighttime symptoms per week and 2daytime symptoms per week currently.   Anxiety Patient takes lorazepam and lexapro.  Patient denies any suicidal ideations.  She mainly uses lorazepam for sleep.  She feels like she is still doing very.   Hyperlipidemia Patient is coming in for recheck of his hyperlipidemia. The patient is currently taking crestor. They deny any issues with myalgias or history of liver damage from it. They deny any focal numbness or weakness or chest pain.   No diagnosis found.  Outpatient Encounter Medications as of 12/01/2018  Medication Sig  . acetaminophen (TYLENOL) 325 MG tablet Take 2 tablets (650 mg total) by mouth every 6 (six) hours as needed for mild pain (or Fever >/= 101).  Marland Kitchen alum & mag hydroxide-simeth (MAALOX/MYLANTA) 200-200-20 MG/5ML suspension Take 30 mLs by mouth every 4 (four) hours as needed for indigestion.  . budesonide-formoterol (SYMBICORT) 160-4.5 MCG/ACT inhaler Inhale 2 puffs into the  lungs 2 (two) times daily.  . Cholecalciferol (VITAMIN D PO) Take 1 tablet by mouth daily.  Marland Kitchen escitalopram (LEXAPRO) 5 MG tablet Take 1 tablet (5 mg total) by mouth daily.  . famotidine (PEPCID) 20 MG tablet Take 1 tablet (20 mg total) by mouth 2 (two) times daily.  . fluticasone (FLONASE) 50 MCG/ACT nasal spray Place 2 sprays into both nostrils daily.  Marland Kitchen ketotifen (ZADITOR) 0.025 % ophthalmic solution Place 1 drop into both eyes 6 (six) times daily.  Marland Kitchen loratadine (CLARITIN) 10 MG tablet TAKE 1 TABLET ONCE DAILY AS NEEDED FOR ALLERGY  . LORazepam (ATIVAN) 1 MG tablet Take 1 tablet (1 mg total) by mouth 2 (two) times daily as needed for anxiety.  . Nutritional Supplements (ENSURE NUTRITION SHAKE PO) Take 1-2 Bottles by mouth daily. Reported on 11/30/2015  . omeprazole (PRILOSEC) 20 MG capsule Take 1 capsule (20 mg total) by mouth daily.  . rosuvastatin (CRESTOR) 5 MG tablet Take 1 tablet (5 mg total) by mouth daily.  Marland Kitchen SALINE NASAL MIST NA Place 1 spray into the nose 2 (two) times daily.  . VENTOLIN HFA 108 (90 Base) MCG/ACT inhaler 2 PUFFS EVERY 6 HOURS AS NEEDED FOR WHEEZING OR SHORTNESS OF BREATH   No facility-administered encounter medications on file as of 12/01/2018.     Review of Systems  Constitutional: Negative for chills and fever.  HENT: Negative for congestion, ear discharge and ear pain.   Eyes: Negative for visual disturbance.  Respiratory: Negative for cough, chest tightness, shortness of breath and wheezing.   Cardiovascular: Negative for chest  pain and leg swelling.  Musculoskeletal: Negative for back pain and gait problem.  Skin: Negative for rash.  Neurological: Negative for light-headedness and headaches.  Psychiatric/Behavioral: Positive for sleep disturbance. Negative for agitation, behavioral problems, decreased concentration, dysphoric mood, hallucinations, self-injury and suicidal ideas. The patient is nervous/anxious. The patient is not hyperactive.   All other  systems reviewed and are negative.   Observations/Objective: Patient sounds comfortable and in no acute distress  Assessment and Plan: Problem List Items Addressed This Visit      Respiratory   Asthma - Primary   Relevant Medications   Fluticasone-Salmeterol (ADVAIR) 100-50 MCG/DOSE AEPB     Other   Anxiety   Relevant Medications   LORazepam (ATIVAN) 1 MG tablet   HLD (hyperlipidemia)       Follow Up Instructions: Follow up in 3 months, continue current medication  Because the Symbicort was not covered we will try and prescribe Advair to replace it and see how that goes.  If not we may have to try Brio Ellipta instead   I discussed the assessment and treatment plan with the patient. The patient was provided an opportunity to ask questions and all were answered. The patient agreed with the plan and demonstrated an understanding of the instructions.   The patient was advised to call back or seek an in-person evaluation if the symptoms worsen or if the condition fails to improve as anticipated.  The above assessment and management plan was discussed with the patient. The patient verbalized understanding of and has agreed to the management plan. Patient is aware to call the clinic if symptoms persist or worsen. Patient is aware when to return to the clinic for a follow-up visit. Patient educated on when it is appropriate to go to the emergency department.    I provided 9 minutes of non-face-to-face time during this encounter.    Nils PyleJoshua A Jarrad Mclees, MD

## 2019-03-03 ENCOUNTER — Encounter: Payer: Self-pay | Admitting: Family Medicine

## 2019-03-03 ENCOUNTER — Ambulatory Visit (INDEPENDENT_AMBULATORY_CARE_PROVIDER_SITE_OTHER): Payer: Medicare HMO | Admitting: Family Medicine

## 2019-03-03 ENCOUNTER — Ambulatory Visit: Payer: Medicare HMO | Admitting: Family Medicine

## 2019-03-03 DIAGNOSIS — F419 Anxiety disorder, unspecified: Secondary | ICD-10-CM

## 2019-03-03 MED ORDER — LORAZEPAM 1 MG PO TABS: 1.0000 mg | ORAL_TABLET | Freq: Two times a day (BID) | ORAL | 2 refills | Status: DC | PRN

## 2019-03-03 MED ORDER — ESCITALOPRAM OXALATE 5 MG PO TABS: 5.0000 mg | ORAL_TABLET | Freq: Every day | ORAL | 2 refills | Status: DC

## 2019-03-03 MED ORDER — ROSUVASTATIN CALCIUM 5 MG PO TABS: 5.0000 mg | ORAL_TABLET | Freq: Every day | ORAL | 2 refills | Status: DC

## 2019-03-03 NOTE — Progress Notes (Signed)
Virtual Visit via telephone Note  I connected with Mikayla Wilson on 03/03/19 at 1128 by telephone and verified that I am speaking with the correct person using two identifiers. Mikayla Wilson is currently located at home and no other people are currently with her during visit. The provider, Fransisca Kaufmann Dettinger, MD is located in their office at time of visit.  Call ended at 1135  I discussed the limitations, risks, security and privacy concerns of performing an evaluation and management service by telephone and the availability of in person appointments. I also discussed with the patient that there may be a patient responsible charge related to this service. The patient expressed understanding and agreed to proceed.   History and Present Illness: Anxiety Patient is coming in for recheck and refill medication for anxiety.  She says she is doing well with anxiety and just is mainly staying home to try to keep away from the coronavirus and everything and really is not feeling too bad about any of it.  She denies any suicidal ideations or thoughts of hurting herself. Current opioids rx-lorazepam 1 mg twice daily as needed # meds rx-40 Effectiveness of current meds-working well and has no major issues, also takes Lexapro for this. Adverse reactions form meds-none Pill count performed-No Last drug screen - n/a ( high risk q77m, moderate risk q66m, low risk yearly ) Urine drug screen today- No virtual appointment so could not do Was the [place] [facility] reviewed-yes  If yes were their any concerning findings? -None  No flowsheet data found.   Pain contract signed on:, N/A it was virtual visit so we will do next time  1. Anxiety     Outpatient Encounter Medications as of 03/03/2019  Medication Sig  . acetaminophen (TYLENOL) 325 MG tablet Take 2 tablets (650 mg total) by mouth every 6 (six) hours as needed for mild pain (or Fever >/= 101).  Marland Kitchen alum & mag hydroxide-simeth (MAALOX/MYLANTA) 200-200-20  MG/5ML suspension Take 30 mLs by mouth every 4 (four) hours as needed for indigestion.  . Cholecalciferol (VITAMIN D PO) Take 1 tablet by mouth daily.  Marland Kitchen escitalopram (LEXAPRO) 5 MG tablet Take 1 tablet (5 mg total) by mouth daily.  . famotidine (PEPCID) 20 MG tablet Take 1 tablet (20 mg total) by mouth 2 (two) times daily.  . fluticasone (FLONASE) 50 MCG/ACT nasal spray Place 2 sprays into both nostrils daily.  . Fluticasone-Salmeterol (ADVAIR) 100-50 MCG/DOSE AEPB Inhale 1 puff into the lungs 2 (two) times daily.  Marland Kitchen ketotifen (ZADITOR) 0.025 % ophthalmic solution Place 1 drop into both eyes 6 (six) times daily.  Marland Kitchen loratadine (CLARITIN) 10 MG tablet TAKE 1 TABLET ONCE DAILY AS NEEDED FOR ALLERGY  . LORazepam (ATIVAN) 1 MG tablet Take 1 tablet (1 mg total) by mouth 2 (two) times daily as needed for anxiety.  . Nutritional Supplements (ENSURE NUTRITION SHAKE PO) Take 1-2 Bottles by mouth daily. Reported on 11/30/2015  . omeprazole (PRILOSEC) 20 MG capsule Take 1 capsule (20 mg total) by mouth daily.  . rosuvastatin (CRESTOR) 5 MG tablet Take 1 tablet (5 mg total) by mouth daily.  Marland Kitchen SALINE NASAL MIST NA Place 1 spray into the nose 2 (two) times daily.  . VENTOLIN HFA 108 (90 Base) MCG/ACT inhaler 2 PUFFS EVERY 6 HOURS AS NEEDED FOR WHEEZING OR SHORTNESS OF BREATH  . [DISCONTINUED] escitalopram (LEXAPRO) 5 MG tablet Take 1 tablet (5 mg total) by mouth daily.  . [DISCONTINUED] LORazepam (ATIVAN) 1 MG tablet Take  1 tablet (1 mg total) by mouth 2 (two) times daily as needed for anxiety.  . [DISCONTINUED] rosuvastatin (CRESTOR) 5 MG tablet Take 1 tablet (5 mg total) by mouth daily.   No facility-administered encounter medications on file as of 03/03/2019.     Review of Systems  Constitutional: Negative for chills and fever.  Eyes: Negative for visual disturbance.  Respiratory: Negative for chest tightness and shortness of breath.   Cardiovascular: Negative for chest pain and leg swelling.   Musculoskeletal: Negative for back pain and gait problem.  Skin: Negative for rash.  Neurological: Negative for dizziness, light-headedness and headaches.  Psychiatric/Behavioral: Negative for agitation, behavioral problems, dysphoric mood, self-injury, sleep disturbance and suicidal ideas. The patient is not nervous/anxious.   All other systems reviewed and are negative.   Observations/Objective: Patient sounds comfortable and in no acute distress  Assessment and Plan: Problem List Items Addressed This Visit      Other   Anxiety - Primary   Relevant Medications   LORazepam (ATIVAN) 1 MG tablet   escitalopram (LEXAPRO) 5 MG tablet       Follow Up Instructions:  Follow-up in 3 months for anxiety and blood work   I discussed the assessment and treatment plan with the patient. The patient was provided an opportunity to ask questions and all were answered. The patient agreed with the plan and demonstrated an understanding of the instructions.   The patient was advised to call back or seek an in-person evaluation if the symptoms worsen or if the condition fails to improve as anticipated.  The above assessment and management plan was discussed with the patient. The patient verbalized understanding of and has agreed to the management plan. Patient is aware to call the clinic if symptoms persist or worsen. Patient is aware when to return to the clinic for a follow-up visit. Patient educated on when it is appropriate to go to the emergency department.    I provided 7 minutes of non-face-to-face time during this encounter.    Nils PyleJoshua A Dettinger, MD

## 2019-03-09 ENCOUNTER — Ambulatory Visit: Payer: Medicare HMO | Admitting: Family Medicine

## 2019-04-18 ENCOUNTER — Ambulatory Visit (INDEPENDENT_AMBULATORY_CARE_PROVIDER_SITE_OTHER): Payer: Medicare HMO

## 2019-04-18 ENCOUNTER — Other Ambulatory Visit: Payer: Self-pay

## 2019-04-18 DIAGNOSIS — Z23 Encounter for immunization: Secondary | ICD-10-CM | POA: Diagnosis not present

## 2019-06-06 ENCOUNTER — Encounter: Payer: Self-pay | Admitting: Family Medicine

## 2019-06-06 ENCOUNTER — Ambulatory Visit (INDEPENDENT_AMBULATORY_CARE_PROVIDER_SITE_OTHER): Payer: Medicare HMO | Admitting: Family Medicine

## 2019-06-06 DIAGNOSIS — F419 Anxiety disorder, unspecified: Secondary | ICD-10-CM | POA: Diagnosis not present

## 2019-06-06 DIAGNOSIS — J4520 Mild intermittent asthma, uncomplicated: Secondary | ICD-10-CM | POA: Diagnosis not present

## 2019-06-06 MED ORDER — LORAZEPAM 1 MG PO TABS: 1.0000 mg | ORAL_TABLET | Freq: Two times a day (BID) | ORAL | 2 refills | Status: DC | PRN

## 2019-06-06 MED ORDER — FLUTICASONE PROPIONATE 50 MCG/ACT NA SUSP: 2.0000 | Freq: Every day | NASAL | 1 refills | Status: DC

## 2019-06-06 MED ORDER — ADVAIR HFA 115-21 MCG/ACT IN AERO: 2.0000 | INHALATION_SPRAY | Freq: Two times a day (BID) | RESPIRATORY_TRACT | 12 refills | Status: DC

## 2019-06-06 NOTE — Progress Notes (Signed)
Virtual Visit via telephone Note  I connected with Mikayla Wilson on 06/06/19 at 1102 by telephone and verified that I am speaking with the correct person using two identifiers. Mikayla Wilson is currently located at home and daughter are currently with her during visit. The provider, Elige Radon , MD is located in their office at time of visit.  Call ended at 1115  I discussed the limitations, risks, security and privacy concerns of performing an evaluation and management service by telephone and the availability of in person appointments. I also discussed with the patient that there may be a patient responsible charge related to this service. The patient expressed understanding and agreed to proceed.   History and Present Illness: Anxiety and depression Patient is coming in for recheck of anxiety and depression.  She currently on Lexapro and Ativan. Current rx-Ativan 1 mg twice daily. # meds rx- 40 Effectiveness of current meds-works well, she mainly uses it at night for sleep Adverse reactions form meds-none  Pill count performed-No Last drug screen -N/A ( high risk q28m, moderate risk q58m, low risk yearly ) Urine drug screen today- No, was virtual, will do at next appointment Was the [facility] reviewed-yes  If yes were their any concerning findings? -None  No flowsheet data found.   Controlled substance contract signed on: N/A  Asthma, does not like advair diskus, likes hfa better and wants one of those if posible. She has some congestion and wheezing.  She says her asthma is acting up mildly.  No diagnosis found.  Outpatient Encounter Medications as of 06/06/2019  Medication Sig  . acetaminophen (TYLENOL) 325 MG tablet Take 2 tablets (650 mg total) by mouth every 6 (six) hours as needed for mild pain (or Fever >/= 101).  Marland Kitchen alum & mag hydroxide-simeth (MAALOX/MYLANTA) 200-200-20 MG/5ML suspension Take 30 mLs by mouth every 4 (four) hours as needed for indigestion.  .  Cholecalciferol (VITAMIN D PO) Take 1 tablet by mouth daily.  Marland Kitchen escitalopram (LEXAPRO) 5 MG tablet Take 1 tablet (5 mg total) by mouth daily.  . famotidine (PEPCID) 20 MG tablet Take 1 tablet (20 mg total) by mouth 2 (two) times daily.  . fluticasone (FLONASE) 50 MCG/ACT nasal spray Place 2 sprays into both nostrils daily.  . Fluticasone-Salmeterol (ADVAIR) 100-50 MCG/DOSE AEPB Inhale 1 puff into the lungs 2 (two) times daily.  Marland Kitchen ketotifen (ZADITOR) 0.025 % ophthalmic solution Place 1 drop into both eyes 6 (six) times daily.  Marland Kitchen loratadine (CLARITIN) 10 MG tablet TAKE 1 TABLET ONCE DAILY AS NEEDED FOR ALLERGY  . LORazepam (ATIVAN) 1 MG tablet Take 1 tablet (1 mg total) by mouth 2 (two) times daily as needed for anxiety.  . Nutritional Supplements (ENSURE NUTRITION SHAKE PO) Take 1-2 Bottles by mouth daily. Reported on 11/30/2015  . omeprazole (PRILOSEC) 20 MG capsule Take 1 capsule (20 mg total) by mouth daily.  . rosuvastatin (CRESTOR) 5 MG tablet Take 1 tablet (5 mg total) by mouth daily.  Marland Kitchen SALINE NASAL MIST NA Place 1 spray into the nose 2 (two) times daily.  . VENTOLIN HFA 108 (90 Base) MCG/ACT inhaler 2 PUFFS EVERY 6 HOURS AS NEEDED FOR WHEEZING OR SHORTNESS OF BREATH   No facility-administered encounter medications on file as of 06/06/2019.     Review of Systems  Constitutional: Negative for chills and fever.  HENT: Positive for congestion. Negative for ear discharge and ear pain.   Eyes: Negative for visual disturbance.  Respiratory: Positive for cough  and wheezing. Negative for chest tightness and shortness of breath.   Cardiovascular: Negative for chest pain and leg swelling.  Musculoskeletal: Negative for back pain and gait problem.  Skin: Negative for rash.  Neurological: Negative for light-headedness and headaches.  Psychiatric/Behavioral: Positive for dysphoric mood and sleep disturbance. Negative for agitation, behavioral problems, self-injury and suicidal ideas. The patient is  nervous/anxious.   All other systems reviewed and are negative.   Observations/Objective: Patient sound comfortable and in no acute distress  Assessment and Plan: Problem List Items Addressed This Visit      Respiratory   Asthma   Relevant Medications   fluticasone-salmeterol (ADVAIR HFA) 115-21 MCG/ACT inhaler     Other   Anxiety - Primary   Relevant Medications   LORazepam (ATIVAN) 1 MG tablet       Follow Up Instructions: Follow up in 3 months anxiety    I discussed the assessment and treatment plan with the patient. The patient was provided an opportunity to ask questions and all were answered. The patient agreed with the plan and demonstrated an understanding of the instructions.   The patient was advised to call back or seek an in-person evaluation if the symptoms worsen or if the condition fails to improve as anticipated.  The above assessment and management plan was discussed with the patient. The patient verbalized understanding of and has agreed to the management plan. Patient is aware to call the clinic if symptoms persist or worsen. Patient is aware when to return to the clinic for a follow-up visit. Patient educated on when it is appropriate to go to the emergency department.    I provided 13 minutes of non-face-to-face time during this encounter.    Worthy Rancher, MD

## 2019-06-24 ENCOUNTER — Other Ambulatory Visit: Payer: Self-pay | Admitting: Family Medicine

## 2019-06-24 MED ORDER — FAMOTIDINE 20 MG PO TABS: 20.0000 mg | ORAL_TABLET | Freq: Two times a day (BID) | ORAL | 0 refills | Status: DC

## 2019-07-24 ENCOUNTER — Other Ambulatory Visit: Payer: Self-pay | Admitting: Family Medicine

## 2019-07-24 DIAGNOSIS — F419 Anxiety disorder, unspecified: Secondary | ICD-10-CM

## 2019-08-29 ENCOUNTER — Other Ambulatory Visit: Payer: Self-pay | Admitting: Family Medicine

## 2019-09-05 ENCOUNTER — Encounter: Payer: Self-pay | Admitting: Family Medicine

## 2019-09-05 ENCOUNTER — Ambulatory Visit (INDEPENDENT_AMBULATORY_CARE_PROVIDER_SITE_OTHER): Payer: Medicare HMO | Admitting: Family Medicine

## 2019-09-05 DIAGNOSIS — F419 Anxiety disorder, unspecified: Secondary | ICD-10-CM

## 2019-09-05 MED ORDER — LORAZEPAM 1 MG PO TABS: 1.0000 mg | ORAL_TABLET | Freq: Two times a day (BID) | ORAL | 2 refills | Status: DC | PRN

## 2019-09-05 NOTE — Progress Notes (Signed)
Virtual Visit via telephone Note  I connected with Mikayla Wilson on 09/05/19 at 1330 by telephone and verified that I am speaking with the correct person using two identifiers. Mikayla Wilson is currently located at home and daughter are currently with her during visit. The provider, Fransisca Kaufmann Loui Massenburg, MD is located in their office at time of visit.  Call ended at 1340  I discussed the limitations, risks, security and privacy concerns of performing an evaluation and management service by telephone and the availability of in person appointments. I also discussed with the patient that there may be a patient responsible charge related to this service. The patient expressed understanding and agreed to proceed.   History and Present Illness: Anxiety and depression Current rx- lorazepam 1mg  bid prn # meds rx- 40 Effectiveness of current meds-works well Adverse reactions form meds-none  Pill count performed-No Last drug screen - n/a ( high risk q94m, moderate risk q24m, low risk yearly ) Urine drug screen today- No, virtual, will do next visit Was the [facility] reviewed- yes  If yes were their any concerning findings? - none  No flowsheet data found.   Controlled substance contract signed on: n/a   No diagnosis found.  Outpatient Encounter Medications as of 09/05/2019  Medication Sig  . acetaminophen (TYLENOL) 325 MG tablet Take 2 tablets (650 mg total) by mouth every 6 (six) hours as needed for mild pain (or Fever >/= 101).  Marland Kitchen alum & mag hydroxide-simeth (MAALOX/MYLANTA) 200-200-20 MG/5ML suspension Take 30 mLs by mouth every 4 (four) hours as needed for indigestion.  . Cholecalciferol (VITAMIN D PO) Take 1 tablet by mouth daily.  Marland Kitchen escitalopram (LEXAPRO) 5 MG tablet Take 1 tablet (5 mg total) by mouth daily.  . famotidine (PEPCID) 20 MG tablet Take 1 tablet (20 mg total) by mouth 2 (two) times daily.  . fluticasone (FLONASE) 50 MCG/ACT nasal spray Place 2 sprays into both nostrils  daily.  . fluticasone-salmeterol (ADVAIR HFA) 115-21 MCG/ACT inhaler Inhale 2 puffs into the lungs 2 (two) times daily.  Marland Kitchen ketotifen (ZADITOR) 0.025 % ophthalmic solution Place 1 drop into both eyes 6 (six) times daily.  Marland Kitchen loratadine (CLARITIN) 10 MG tablet TAKE 1 TABLET BY MOUTH ONCE DAILY AS NEEDED FOR ALLERGIES  . LORazepam (ATIVAN) 1 MG tablet Take 1 tablet (1 mg total) by mouth 2 (two) times daily as needed for anxiety.  . Nutritional Supplements (ENSURE NUTRITION SHAKE PO) Take 1-2 Bottles by mouth daily. Reported on 11/30/2015  . omeprazole (PRILOSEC) 20 MG capsule Take 1 capsule (20 mg total) by mouth daily.  . rosuvastatin (CRESTOR) 5 MG tablet Take 1 tablet (5 mg total) by mouth daily.  Marland Kitchen SALINE NASAL MIST NA Place 1 spray into the nose 2 (two) times daily.  . VENTOLIN HFA 108 (90 Base) MCG/ACT inhaler 2 PUFFS EVERY 6 HOURS AS NEEDED FOR WHEEZING OR SHORTNESS OF BREATH   No facility-administered encounter medications on file as of 09/05/2019.    Review of Systems  Constitutional: Negative for chills and fever.  Eyes: Negative for visual disturbance.  Respiratory: Negative for chest tightness and shortness of breath.   Cardiovascular: Negative for chest pain and leg swelling.  Musculoskeletal: Negative for back pain and gait problem.  Skin: Negative for rash.  Neurological: Negative for light-headedness and headaches.  Psychiatric/Behavioral: Positive for sleep disturbance. Negative for agitation, behavioral problems, decreased concentration, dysphoric mood, self-injury and suicidal ideas. The patient is nervous/anxious.   All other systems reviewed and  are negative.   Observations/Objective: Patient sounds comfortable and in no acute distress  Assessment and Plan: Problem List Items Addressed This Visit      Other   Anxiety - Primary   Relevant Medications   LORazepam (ATIVAN) 1 MG tablet      Continue current medicaitons Follow up plan: Return in about 3 months  (around 12/03/2019), or if symptoms worsen or fail to improve, for anxiety.     I discussed the assessment and treatment plan with the patient. The patient was provided an opportunity to ask questions and all were answered. The patient agreed with the plan and demonstrated an understanding of the instructions.   The patient was advised to call back or seek an in-person evaluation if the symptoms worsen or if the condition fails to improve as anticipated.  The above assessment and management plan was discussed with the patient. The patient verbalized understanding of and has agreed to the management plan. Patient is aware to call the clinic if symptoms persist or worsen. Patient is aware when to return to the clinic for a follow-up visit. Patient educated on when it is appropriate to go to the emergency department.    I provided 10 minutes of non-face-to-face time during this encounter.    Nils Pyle, MD

## 2019-09-21 ENCOUNTER — Telehealth: Payer: Self-pay | Admitting: Family Medicine

## 2019-09-21 NOTE — Chronic Care Management (AMB) (Signed)
  Chronic Care Management   Note  09/21/2019 Name: Mikayla Wilson MRN: [id number] DOB: 1931/01/18  Mikayla Wilson is a 86 y.o. year old female who is a primary care patient of Dettinger, Fransisca Kaufmann, MD. I reached out to Mikayla Wilson by phone today in response to a referral sent by Mikayla Wilson's health plan.     Mikayla Wilson was given information about Chronic Care Management services today including:  1. CCM service includes personalized support from designated clinical staff supervised by her physician, including individualized plan of care and coordination with other care providers 2. 24/7 contact phone numbers for assistance for urgent and routine care needs. 3. Service will only be billed when office clinical staff spend 20 minutes or more in a month to coordinate care. 4. Only one practitioner may furnish and bill the service in a calendar month. 5. The patient may stop CCM services at any time (effective at the end of the month) by phone call to the office staff. 6. The patient will be responsible for cost sharing (co-pay) of up to 20% of the service fee (after annual deductible is met).  Patient did not agree to enrollment in care management services and does not wish to consider at this time.  Follow up plan: The patient has been provided with contact information for the care management team and has been advised to call with any health related questions or concerns.   [place], [place] [address] Direct Dial: [phone] [email or web address]: [email or web address]

## 2019-10-18 ENCOUNTER — Other Ambulatory Visit: Payer: Self-pay | Admitting: Family Medicine

## 2019-12-07 ENCOUNTER — Encounter: Payer: Self-pay | Admitting: Family Medicine

## 2019-12-07 ENCOUNTER — Ambulatory Visit (INDEPENDENT_AMBULATORY_CARE_PROVIDER_SITE_OTHER): Payer: Medicare HMO | Admitting: Family Medicine

## 2019-12-07 ENCOUNTER — Telehealth: Payer: Self-pay | Admitting: Family Medicine

## 2019-12-07 ENCOUNTER — Other Ambulatory Visit: Payer: Self-pay

## 2019-12-07 ENCOUNTER — Other Ambulatory Visit: Payer: Self-pay | Admitting: Family Medicine

## 2019-12-07 VITALS — BP 125/71 | HR 78 | Temp 97.3°F | Ht 64.0 in | Wt 143.4 lb

## 2019-12-07 DIAGNOSIS — Z79891 Long term (current) use of opiate analgesic: Secondary | ICD-10-CM | POA: Diagnosis not present

## 2019-12-07 DIAGNOSIS — R739 Hyperglycemia, unspecified: Secondary | ICD-10-CM | POA: Diagnosis not present

## 2019-12-07 DIAGNOSIS — K219 Gastro-esophageal reflux disease without esophagitis: Secondary | ICD-10-CM | POA: Diagnosis not present

## 2019-12-07 DIAGNOSIS — F419 Anxiety disorder, unspecified: Secondary | ICD-10-CM

## 2019-12-07 DIAGNOSIS — E782 Mixed hyperlipidemia: Secondary | ICD-10-CM | POA: Diagnosis not present

## 2019-12-07 DIAGNOSIS — R300 Dysuria: Secondary | ICD-10-CM

## 2019-12-07 LAB — URINALYSIS, COMPLETE
Bilirubin, UA: NEGATIVE
Glucose, UA: NEGATIVE
Ketones, UA: NEGATIVE
Nitrite, UA: POSITIVE — AB
Protein,UA: NEGATIVE
Specific Gravity, UA: 1.01 (ref 1.005–1.030)
Urobilinogen, Ur: 0.2 mg/dL (ref 0.2–1.0)
pH, UA: 6 (ref 5.0–7.5)

## 2019-12-07 LAB — MICROSCOPIC EXAMINATION: WBC, UA: >30 /hpf — AB (ref 0–5)

## 2019-12-07 LAB — BAYER DCA HB A1C WAIVED: HB A1C (BAYER DCA - WAIVED): 5 % (ref <7.0)

## 2019-12-07 MED ORDER — FAMOTIDINE 20 MG PO TABS: 20.0000 mg | ORAL_TABLET | Freq: Two times a day (BID) | ORAL | 3 refills | Status: DC

## 2019-12-07 MED ORDER — ROSUVASTATIN CALCIUM 5 MG PO TABS: 5.0000 mg | ORAL_TABLET | Freq: Every day | ORAL | 3 refills | Status: DC

## 2019-12-07 MED ORDER — OMEPRAZOLE 20 MG PO CPDR: 20.0000 mg | DELAYED_RELEASE_CAPSULE | Freq: Every day | ORAL | 3 refills | Status: DC

## 2019-12-07 MED ORDER — LORAZEPAM 1 MG PO TABS: 1.0000 mg | ORAL_TABLET | Freq: Two times a day (BID) | ORAL | 2 refills | Status: DC | PRN

## 2019-12-07 MED ORDER — CEPHALEXIN 500 MG PO CAPS: 500.0000 mg | ORAL_CAPSULE | Freq: Four times a day (QID) | ORAL | 0 refills | Status: DC

## 2019-12-07 MED ORDER — ESCITALOPRAM OXALATE 5 MG PO TABS: 5.0000 mg | ORAL_TABLET | Freq: Every day | ORAL | 3 refills | Status: DC

## 2019-12-07 NOTE — Progress Notes (Signed)
BP 125/71   Pulse 78   Temp (!) 97.3 F (36.3 C) (Temporal)   Ht _0  (1.626 m)   Wt 143 lb 6 oz (65 kg)   BMI 24.61 kg/m    Subjective:   Patient ID: Mikayla Wilson, female    DOB: February 06, 1931, 86 y.o.   MRN: [id number]  HPI: Mikayla Wilson is a 86 y.o. female presenting on 12/07/2019 for Medical Management of Chronic Issues   HPI  Anxiety recheck Current rx-Lexapro and lorazepam # meds rx-40 lorazepam Effectiveness of current meds-works well, sometimes has to use to Adverse reactions form meds-none currently  Pill count performed-No Last drug screen -N/A ( high risk q52m moderate risk q641mlow risk yearly ) Urine drug screen today- Yes [place]  If yes were their any concerning findings? -None  No flowsheet data found.   Controlled substance contract signed on: Today  Patient comes in complaint of mild dysuria that is been going on for a few days.  She denies any fevers chills or flank pain vaginal discharge irritation or bleeding.  She is concerned that she might be starting developed bladder infection.  She has had a little bit of frequency as well.  Type 2 diabetes mellitus Patient comes in today for recheck of his diabetes. Patient has been currently taking no medication and are just monitoring. Patient is not currently on an ACE inhibitor/ARB. Patient has not seen an ophthalmologist this year. Patient denies any issues with their feet. The symptom started onset as an adult hyperlipidemia and GERD ARE RELATED TO DM   Hyperlipidemia Patient is coming in for recheck of his hyperlipidemia. The patient is currently taking Crestor. They deny any issues with myalgias or history of liver damage from it. They deny any focal numbness or weakness or chest pain.   GERD Patient is currently on omeprazole.  She denies any major symptoms or abdominal pain or belching or burping. She denies any blood in her stool or lightheadedness or dizziness.    Relevant past medical, surgical, family and social history reviewed and updated as indicated. Interim medical history since our last visit reviewed. Allergies and medications reviewed and updated.  Review of Systems  Constitutional: Negative for chills and fever.  Eyes: Negative for visual disturbance.  Respiratory: Negative for chest tightness and shortness of breath.   Cardiovascular: Negative for chest pain and leg swelling.  Genitourinary: Negative for difficulty urinating and dysuria.  Musculoskeletal: Negative for back pain and gait problem.  Skin: Negative for rash.  Neurological: Negative for light-headedness and headaches.  Psychiatric/Behavioral: Positive for sleep disturbance. Negative for agitation, behavioral problems, dysphoric mood, self-injury and suicidal ideas. The patient is nervous/anxious.   All other systems reviewed and are negative.   Per HPI unless specifically indicated above   Allergies as of 12/07/2019      Reactions   Aspirin Other (See Comments)   "My doctor told me to never take aspirin"   Morphine And Related Other (See Comments)      Medication List       Accurate as of Dec 07, 2019  9:18 AM. If you have any questions, ask your nurse or doctor.        acetaminophen 325 MG tablet Commonly known as: TYLENOL Take 2 tablets (650 mg total) by mouth every 6 (six) hours as needed for mild pain (or Fever >/= 101).   Advair HFA 115-21 MCG/ACT inhaler Generic drug: fluticasone-salmeterol Inhale 2 puffs into the  lungs 2 (two) times daily.   alum & mag hydroxide-simeth 200-200-20 MG/5ML suspension Commonly known as: MAALOX/MYLANTA Take 30 mLs by mouth every 4 (four) hours as needed for indigestion.   ENSURE NUTRITION SHAKE PO Take 1-2 Bottles by mouth daily. Reported on 11/30/2015   escitalopram 5 MG tablet Commonly known as: LEXAPRO Take 1 tablet (5 mg total) by mouth daily.   famotidine 20 MG tablet Commonly known as: PEPCID Take 1 tablet by  mouth twice daily   fluticasone 50 MCG/ACT nasal spray Commonly known as: Flonase Place 2 sprays into both nostrils daily.   ketotifen 0.025 % ophthalmic solution Commonly known as: ZADITOR Place 1 drop into both eyes 6 (six) times daily.   loratadine 10 MG tablet Commonly known as: CLARITIN TAKE 1 TABLET BY MOUTH ONCE DAILY AS NEEDED FOR ALLERGIES   LORazepam 1 MG tablet Commonly known as: ATIVAN Take 1 tablet (1 mg total) by mouth 2 (two) times daily as needed for anxiety.   omeprazole 20 MG capsule Commonly known as: PRILOSEC Take 1 capsule by mouth once daily   rosuvastatin 5 MG tablet Commonly known as: CRESTOR Take 1 tablet (5 mg total) by mouth daily.   SALINE NASAL MIST NA Place 1 spray into the nose 2 (two) times daily.   Ventolin HFA 108 (90 Base) MCG/ACT inhaler Generic drug: albuterol 2 PUFFS EVERY 6 HOURS AS NEEDED FOR WHEEZING OR SHORTNESS OF BREATH   VITAMIN D PO Take 1 tablet by mouth daily.        Objective:   BP 125/71   Pulse 78   Temp (!) 97.3 F (36.3 C) (Temporal)   Ht _0  (1.626 m)   Wt 143 lb 6 oz (65 kg)   BMI 24.61 kg/m   Wt Readings from Last 3 Encounters:  12/07/19 143 lb 6 oz (65 kg)  08/30/18 153 lb (69.4 kg)  05/26/18 154 lb 9.6 oz (70.1 kg)    Physical Exam Vitals and nursing note reviewed.  Constitutional:      General: She is not in acute distress.    Appearance: She is well-developed. She is not diaphoretic.  Eyes:     Conjunctiva/sclera: Conjunctivae normal.  Cardiovascular:     Rate and Rhythm: Normal rate and regular rhythm.     Heart sounds: Normal heart sounds. No murmur.  Pulmonary:     Effort: Pulmonary effort is normal. No respiratory distress.     Breath sounds: Normal breath sounds. No wheezing.  Musculoskeletal:        General: No tenderness. Normal range of motion.  Skin:    General: Skin is warm and dry.     Findings: No rash.  Neurological:     Mental Status: She is alert and oriented to  person, place, and time.     Coordination: Coordination normal.  Psychiatric:        Behavior: Behavior normal.       Assessment & Plan:   Problem List Items Addressed This Visit      Digestive   GERD (gastroesophageal reflux disease)   Relevant Orders   CBC with Differential/Platelet   CMP14+EGFR     Other   Anxiety   Relevant Medications   LORazepam (ATIVAN) 1 MG tablet   Other Relevant Orders   CBC with Differential/Platelet   ToxASSURE Select 13 (MW), Urine   HLD (hyperlipidemia) - Primary   Relevant Orders   Lipid panel    Other Visit Diagnoses    Dysuria  Relevant Orders   Urinalysis, Complete   Urine Culture   Blood glucose elevated       Relevant Orders   Bayer DCA Hb A1c Waived      Continue lorazepam, encouraged to decrease such as not eating as much.  She has not had any falls recently but it has some last year this was why we reduced.  Patient has dysuria will run urinalysis and culture   Follow up plan: Return in about 3 months (around 03/08/2020), or if symptoms worsen or fail to improve, for Anxiety recheck and blood work.  Counseling provided for all of the vaccine components Orders Placed This Encounter  Procedures  . Urine Culture  . Bayer DCA Hb A1c Waived  . CBC with Differential/Platelet  . CMP14+EGFR  . Lipid panel  . ToxASSURE Select 13 (MW), Urine  . Urinalysis, Complete    Caryl Pina, MD [facility] 12/07/2019, 9:18 AM

## 2019-12-07 NOTE — Telephone Encounter (Signed)
Patient's urine does show like she has infection, I sent Keflex for her

## 2019-12-08 LAB — CBC WITH DIFFERENTIAL/PLATELET
Basophils Absolute: 0.1 10*3/uL (ref 0.0–0.2)
Basos: 1 %
EOS (ABSOLUTE): 0.4 10*3/uL (ref 0.0–0.4)
Eos: 5 %
Hematocrit: 40.5 % (ref 34.0–46.6)
Hemoglobin: 13.4 g/dL (ref 11.1–15.9)
Immature Grans (Abs): 0 10*3/uL (ref 0.0–0.1)
Immature Granulocytes: 0 %
Lymphocytes Absolute: 3.3 10*3/uL — ABNORMAL HIGH (ref 0.7–3.1)
Lymphs: 38 %
MCH: 33.7 pg — ABNORMAL HIGH (ref 26.6–33.0)
MCHC: 33.1 g/dL (ref 31.5–35.7)
MCV: 102 fL — ABNORMAL HIGH (ref 79–97)
Monocytes Absolute: 0.5 10*3/uL (ref 0.1–0.9)
Monocytes: 6 %
Neutrophils Absolute: 4.3 10*3/uL (ref 1.4–7.0)
Neutrophils: 50 %
Platelets: 194 10*3/uL (ref 150–450)
RBC: 3.98 x10E6/uL (ref 3.77–5.28)
RDW: 12.1 % (ref 11.7–15.4)
WBC: 8.6 10*3/uL (ref 3.4–10.8)

## 2019-12-08 LAB — CMP14+EGFR
ALT: 21 IU/L (ref 0–32)
AST: 35 IU/L (ref 0–40)
Albumin/Globulin Ratio: 2.4 — ABNORMAL HIGH (ref 1.2–2.2)
Albumin: 4.3 g/dL (ref 3.6–4.6)
Alkaline Phosphatase: 53 IU/L (ref 48–121)
BUN/Creatinine Ratio: 7 — ABNORMAL LOW (ref 12–28)
BUN: 5 mg/dL — ABNORMAL LOW (ref 8–27)
Bilirubin Total: 0.5 mg/dL (ref 0.0–1.2)
CO2: 25 mmol/L (ref 20–29)
Calcium: 9.1 mg/dL (ref 8.7–10.3)
Chloride: 103 mmol/L (ref 96–106)
Creatinine, Ser: 0.76 mg/dL (ref 0.57–1.00)
GFR calc Af Amer: 82 mL/min/{1.73_m2} (ref >59)
GFR calc non Af Amer: 71 mL/min/{1.73_m2} (ref >59)
Globulin, Total: 1.8 g/dL (ref 1.5–4.5)
Glucose: 95 mg/dL (ref 65–99)
Potassium: 3.1 mmol/L — ABNORMAL LOW (ref 3.5–5.2)
Sodium: 142 mmol/L (ref 134–144)
Total Protein: 6.1 g/dL (ref 6.0–8.5)

## 2019-12-08 LAB — LIPID PANEL
Chol/HDL Ratio: 2.2 ratio (ref 0.0–4.4)
Cholesterol, Total: 141 mg/dL (ref 100–199)
HDL: 65 mg/dL (ref >39)
LDL Chol Calc (NIH): 52 mg/dL (ref 0–99)
Triglycerides: 139 mg/dL (ref 0–149)
VLDL Cholesterol Cal: 24 mg/dL (ref 5–40)

## 2019-12-09 LAB — TOXASSURE SELECT 13 (MW), URINE

## 2019-12-09 LAB — URINE CULTURE

## 2019-12-09 NOTE — Telephone Encounter (Signed)
Patient aware.

## 2020-03-12 ENCOUNTER — Encounter: Payer: Self-pay | Admitting: Family Medicine

## 2020-03-12 ENCOUNTER — Ambulatory Visit (INDEPENDENT_AMBULATORY_CARE_PROVIDER_SITE_OTHER): Payer: Medicare HMO | Admitting: Family Medicine

## 2020-03-12 ENCOUNTER — Other Ambulatory Visit: Payer: Self-pay

## 2020-03-12 VITALS — BP 135/71 | HR 98 | Temp 98.0°F | Ht 64.0 in | Wt 137.5 lb

## 2020-03-12 DIAGNOSIS — F419 Anxiety disorder, unspecified: Secondary | ICD-10-CM | POA: Diagnosis not present

## 2020-03-12 DIAGNOSIS — K219 Gastro-esophageal reflux disease without esophagitis: Secondary | ICD-10-CM

## 2020-03-12 DIAGNOSIS — E782 Mixed hyperlipidemia: Secondary | ICD-10-CM | POA: Diagnosis not present

## 2020-03-12 MED ORDER — LORATADINE 10 MG PO TABS
ORAL_TABLET | ORAL | 1 refills | Status: DC
Start: 2020-03-12 — End: 2020-09-19

## 2020-03-12 MED ORDER — LORAZEPAM 1 MG PO TABS: 1.0000 mg | ORAL_TABLET | Freq: Two times a day (BID) | ORAL | 2 refills | Status: DC | PRN

## 2020-03-12 NOTE — Progress Notes (Signed)
BP 135/71   Pulse 98   Temp 98 F (36.7 C)   Ht 5\' 4"  (1.626 m)   Wt 137 lb 8 oz (62.4 kg)   SpO2 97%   BMI 23.60 kg/m    Subjective:   Patient ID: Mikayla Wilson, female    DOB: 30-Nov-1930, 86 y.o.   MRN: [id number]  HPI: Mikayla Wilson is a 86 y.o. female presenting on 03/12/2020 for Medical Management of Chronic Issues and Anxiety   HPI Hyperlipidemia Patient is coming in for recheck of his hyperlipidemia. The patient is currently taking Crestor. They deny any issues with myalgias or history of liver damage from it. They deny any focal numbness or weakness or chest pain.   GERD Patient is currently on omeprazole.  She denies any major symptoms or abdominal pain or belching or burping. She denies any blood in her stool or lightheadedness or dizziness.   Anxiety Current rx-Ativan 1 mg, 1 twice daily as needed # meds rx-40 Effectiveness of current meds-works well Adverse reactions form meds-none  Pill count performed-No Last drug screen -12/13/2019 ( high risk q73m, moderate risk q62m, low risk yearly ) Urine drug screen today- No Was [place]yes  If yes were their any concerning findings? -None  No flowsheet data found.   Controlled substance contract signed on: 12/13/2019  Relevant past medical, surgical, family and social history reviewed and updated as indicated. Interim medical history since our last visit reviewed. Allergies and medications reviewed and updated.  Review of Systems  Constitutional: Negative for chills and fever.  Eyes: Negative for redness and visual disturbance.  Respiratory: Negative for chest tightness and shortness of breath.   Cardiovascular: Negative for chest pain and leg swelling.  Skin: Negative for rash.  Neurological: Negative for light-headedness and headaches.  Psychiatric/Behavioral: Negative for agitation and behavioral problems. The patient is nervous/anxious.   All other systems reviewed and are negative.   Per  HPI unless specifically indicated above   Allergies as of 03/12/2020      Reactions   Aspirin Other (See Comments)   "My doctor told me to never take aspirin"   Morphine And Related Other (See Comments)      Medication List       Accurate as of March 12, 2020 10:51 AM. If you have any questions, ask your nurse or doctor.        acetaminophen 325 MG tablet Commonly known as: TYLENOL Take 2 tablets (650 mg total) by mouth every 6 (six) hours as needed for mild pain (or Fever >/= 101).   Advair HFA 115-21 MCG/ACT inhaler Generic drug: fluticasone-salmeterol Inhale 2 puffs into the lungs 2 (two) times daily.   alum & mag hydroxide-simeth 200-200-20 MG/5ML suspension Commonly known as: MAALOX/MYLANTA Take 30 mLs by mouth every 4 (four) hours as needed for indigestion.   cephALEXin 500 MG capsule Commonly known as: KEFLEX Take 1 capsule (500 mg total) by mouth 4 (four) times daily.   ENSURE NUTRITION SHAKE PO Take 1-2 Bottles by mouth daily. Reported on 11/30/2015   escitalopram 5 MG tablet Commonly known as: LEXAPRO Take 1 tablet (5 mg total) by mouth daily.   famotidine 20 MG tablet Commonly known as: PEPCID Take 1 tablet (20 mg total) by mouth 2 (two) times daily.   fluticasone 50 MCG/ACT nasal spray Commonly known as: Flonase Place 2 sprays into both nostrils daily.   ketotifen 0.025 % ophthalmic solution Commonly known as: ZADITOR Place 1 drop into  both eyes 6 (six) times daily.   loratadine 10 MG tablet Commonly known as: EQ Loratadine TAKE 1 TABLET BY MOUTH ONCE DAILY AS NEEDED FOR ALLERGIES   LORazepam 1 MG tablet Commonly known as: ATIVAN Take 1 tablet (1 mg total) by mouth 2 (two) times daily as needed for anxiety.   omeprazole 20 MG capsule Commonly known as: PRILOSEC Take 1 capsule (20 mg total) by mouth daily.   rosuvastatin 5 MG tablet Commonly known as: CRESTOR Take 1 tablet (5 mg total) by mouth daily.   SALINE NASAL MIST NA Place 1 spray  into the nose 2 (two) times daily.   Ventolin HFA 108 (90 Base) MCG/ACT inhaler Generic drug: albuterol 2 PUFFS EVERY 6 HOURS AS NEEDED FOR WHEEZING OR SHORTNESS OF BREATH   VITAMIN D PO Take 1 tablet by mouth daily.        Objective:   BP 135/71   Pulse 98   Temp 98 F (36.7 C)   Ht 5\' 4"  (1.626 m)   Wt 137 lb 8 oz (62.4 kg)   SpO2 97%   BMI 23.60 kg/m   Wt Readings from Last 3 Encounters:  03/12/20 137 lb 8 oz (62.4 kg)  12/07/19 143 lb 6 oz (65 kg)  08/30/18 153 lb (69.4 kg)    Physical Exam Vitals and nursing note reviewed.  Constitutional:      General: She is not in acute distress.    Appearance: She is well-developed. She is not diaphoretic.  Eyes:     Conjunctiva/sclera: Conjunctivae normal.  Cardiovascular:     Rate and Rhythm: Normal rate and regular rhythm.     Heart sounds: Normal heart sounds. No murmur heard.   Pulmonary:     Effort: Pulmonary effort is normal. No respiratory distress.     Breath sounds: Normal breath sounds. No wheezing.  Musculoskeletal:        General: No tenderness. Normal range of motion.  Skin:    General: Skin is warm and dry.     Findings: No rash.  Neurological:     Mental Status: She is alert and oriented to person, place, and time.     Coordination: Coordination normal.  Psychiatric:        Behavior: Behavior normal.       Assessment & Plan:   Problem List Items Addressed This Visit      Digestive   GERD (gastroesophageal reflux disease)     Other   Anxiety   Relevant Medications   LORazepam (ATIVAN) 1 MG tablet   HLD (hyperlipidemia) - Primary      Continue current medication, no changes, seems to be doing well   Follow up plan: Return in about 3 months (around 06/12/2020), or if symptoms worsen or fail to improve, for anxiety.  Counseling provided for all of the vaccine components No orders of the defined types were placed in this encounter.   06/14/2020, MD [facility] 03/12/2020, 10:51 AM

## 2020-04-17 ENCOUNTER — Other Ambulatory Visit: Payer: Self-pay

## 2020-04-17 ENCOUNTER — Ambulatory Visit (INDEPENDENT_AMBULATORY_CARE_PROVIDER_SITE_OTHER): Payer: Medicare HMO | Admitting: *Deleted

## 2020-04-17 DIAGNOSIS — Z23 Encounter for immunization: Secondary | ICD-10-CM | POA: Diagnosis not present

## 2020-05-24 ENCOUNTER — Encounter: Payer: Self-pay | Admitting: *Deleted

## 2020-06-20 ENCOUNTER — Other Ambulatory Visit: Payer: Self-pay

## 2020-06-20 ENCOUNTER — Encounter: Payer: Self-pay | Admitting: Family Medicine

## 2020-06-20 ENCOUNTER — Ambulatory Visit (INDEPENDENT_AMBULATORY_CARE_PROVIDER_SITE_OTHER): Payer: Medicare HMO | Admitting: Family Medicine

## 2020-06-20 VITALS — BP 122/63 | HR 78 | Ht 64.0 in | Wt 137.0 lb

## 2020-06-20 DIAGNOSIS — Z23 Encounter for immunization: Secondary | ICD-10-CM | POA: Diagnosis not present

## 2020-06-20 DIAGNOSIS — E782 Mixed hyperlipidemia: Secondary | ICD-10-CM | POA: Diagnosis not present

## 2020-06-20 DIAGNOSIS — K219 Gastro-esophageal reflux disease without esophagitis: Secondary | ICD-10-CM

## 2020-06-20 DIAGNOSIS — F419 Anxiety disorder, unspecified: Secondary | ICD-10-CM | POA: Diagnosis not present

## 2020-06-20 LAB — CMP14+EGFR
ALT: 17 IU/L (ref 0–32)
AST: 27 IU/L (ref 0–40)
Albumin/Globulin Ratio: 1.9 (ref 1.2–2.2)
Albumin: 4.3 g/dL (ref 3.6–4.6)
Alkaline Phosphatase: 64 IU/L (ref 44–121)
BUN/Creatinine Ratio: 13 (ref 12–28)
BUN: 11 mg/dL (ref 8–27)
Bilirubin Total: 0.3 mg/dL (ref 0.0–1.2)
CO2: 24 mmol/L (ref 20–29)
Calcium: 9.3 mg/dL (ref 8.7–10.3)
Chloride: 100 mmol/L (ref 96–106)
Creatinine, Ser: 0.88 mg/dL (ref 0.57–1.00)
GFR calc Af Amer: 68 mL/min/{1.73_m2} (ref >59)
GFR calc non Af Amer: 59 mL/min/{1.73_m2} — ABNORMAL LOW (ref >59)
Globulin, Total: 2.3 g/dL (ref 1.5–4.5)
Glucose: 88 mg/dL (ref 65–99)
Potassium: 4.1 mmol/L (ref 3.5–5.2)
Sodium: 138 mmol/L (ref 134–144)
Total Protein: 6.6 g/dL (ref 6.0–8.5)

## 2020-06-20 LAB — CBC WITH DIFFERENTIAL/PLATELET
Basophils Absolute: 0.1 10*3/uL (ref 0.0–0.2)
Basos: 1 %
EOS (ABSOLUTE): 0.6 10*3/uL — ABNORMAL HIGH (ref 0.0–0.4)
Eos: 6 %
Hematocrit: 39.5 % (ref 34.0–46.6)
Hemoglobin: 13.5 g/dL (ref 11.1–15.9)
Immature Grans (Abs): 0 10*3/uL (ref 0.0–0.1)
Immature Granulocytes: 0 %
Lymphocytes Absolute: 2.4 10*3/uL (ref 0.7–3.1)
Lymphs: 27 %
MCH: 33 pg (ref 26.6–33.0)
MCHC: 34.2 g/dL (ref 31.5–35.7)
MCV: 97 fL (ref 79–97)
Monocytes Absolute: 0.5 10*3/uL (ref 0.1–0.9)
Monocytes: 6 %
Neutrophils Absolute: 5.2 10*3/uL (ref 1.4–7.0)
Neutrophils: 60 %
Platelets: 200 10*3/uL (ref 150–450)
RBC: 4.09 x10E6/uL (ref 3.77–5.28)
RDW: 11.9 % (ref 11.7–15.4)
WBC: 8.7 10*3/uL (ref 3.4–10.8)

## 2020-06-20 LAB — LIPID PANEL
Chol/HDL Ratio: 2.4 ratio (ref 0.0–4.4)
Cholesterol, Total: 149 mg/dL (ref 100–199)
HDL: 63 mg/dL (ref >39)
LDL Chol Calc (NIH): 65 mg/dL (ref 0–99)
Triglycerides: 117 mg/dL (ref 0–149)
VLDL Cholesterol Cal: 21 mg/dL (ref 5–40)

## 2020-06-20 NOTE — Addendum Note (Signed)
Addended by: Dorene Sorrow on: 06/20/2020 11:13 AM   Modules accepted: Orders

## 2020-06-20 NOTE — Progress Notes (Signed)
BP 122/63   Pulse 78   Ht _0  (1.626 m)   Wt 137 lb (62.1 kg)   SpO2 98%   BMI 23.52 kg/m    Subjective:   Patient ID: Mikayla Wilson, female    DOB: Jul 15, 1930, 86 y.o.   MRN: [id number]  HPI: Mikayla Wilson is a 86 y.o. female presenting on 06/20/2020 for Medical Management of Chronic Issues, Hyperlipidemia, and Anxiety   HPI Hyperlipidemia Patient is coming in for recheck of his hyperlipidemia. The patient is currently taking Crestor. They deny any issues with myalgias or history of liver damage from it. They deny any focal numbness or weakness or chest pain.   GERD Patient is currently on omeprazole.  She denies any major symptoms or abdominal pain or belching or burping. She denies any blood in her stool or lightheadedness or dizziness.   Anxiety Patient is coming in for anxiety recheck. Current rx-Ativan 1 mg twice daily as needed # meds rx-40 Effectiveness of current meds-working well. Adverse reactions form meds-none  Pill count performed-No Last drug screen -12/13/2019 ( high risk q26m moderate risk q685mlow risk yearly ) Urine drug screen today- No [place]  If yes were their any concerning findings? - none   No flowsheet data found.   Controlled substance contract signed on: 12/13/2019  Relevant past medical, surgical, family and social history reviewed and updated as indicated. Interim medical history since our last visit reviewed. Allergies and medications reviewed and updated.  Review of Systems  Constitutional: Negative for chills and fever.  HENT: Negative for congestion, ear discharge and ear pain.   Eyes: Negative for redness and visual disturbance.  Respiratory: Negative for chest tightness and shortness of breath.   Cardiovascular: Negative for chest pain and leg swelling.  Musculoskeletal: Negative for back pain and gait problem.  Skin: Negative for rash.  Neurological: Negative for light-headedness and headaches.   Psychiatric/Behavioral: Negative for agitation and behavioral problems.  All other systems reviewed and are negative.   Per HPI unless specifically indicated above   Allergies as of 06/20/2020      Reactions   Aspirin Other (See Comments)   "My doctor told me to never take aspirin"   Morphine And Related Other (See Comments)      Medication List       Accurate as of June 20, 2020  8:21 AM. If you have any questions, ask your nurse or doctor.        STOP taking these medications   cephALEXin 500 MG capsule Commonly known as: KEFLEX Stopped by: JoFransisca Kaufmannettinger, MD     TAKE these medications   acetaminophen 325 MG tablet Commonly known as: TYLENOL Take 2 tablets (650 mg total) by mouth every 6 (six) hours as needed for mild pain (or Fever >/= 101).   Advair HFA 115-21 MCG/ACT inhaler Generic drug: fluticasone-salmeterol Inhale 2 puffs into the lungs 2 (two) times daily.   alum & mag hydroxide-simeth 200-200-20 MG/5ML suspension Commonly known as: MAALOX/MYLANTA Take 30 mLs by mouth every 4 (four) hours as needed for indigestion.   ENSURE NUTRITION SHAKE PO Take 1-2 Bottles by mouth daily. Reported on 11/30/2015   escitalopram 5 MG tablet Commonly known as: LEXAPRO Take 1 tablet (5 mg total) by mouth daily.   famotidine 20 MG tablet Commonly known as: PEPCID Take 1 tablet (20 mg total) by mouth 2 (two) times daily.   fluticasone 50 MCG/ACT nasal spray Commonly known  as: Flonase Place 2 sprays into both nostrils daily.   ketotifen 0.025 % ophthalmic solution Commonly known as: ZADITOR Place 1 drop into both eyes 6 (six) times daily.   loratadine 10 MG tablet Commonly known as: EQ Loratadine TAKE 1 TABLET BY MOUTH ONCE DAILY AS NEEDED FOR ALLERGIES   LORazepam 1 MG tablet Commonly known as: ATIVAN Take 1 tablet (1 mg total) by mouth 2 (two) times daily as needed for anxiety.   omeprazole 20 MG capsule Commonly known as: PRILOSEC Take 1 capsule (20  mg total) by mouth daily.   rosuvastatin 5 MG tablet Commonly known as: CRESTOR Take 1 tablet (5 mg total) by mouth daily.   SALINE NASAL MIST NA Place 1 spray into the nose 2 (two) times daily.   Ventolin HFA 108 (90 Base) MCG/ACT inhaler Generic drug: albuterol 2 PUFFS EVERY 6 HOURS AS NEEDED FOR WHEEZING OR SHORTNESS OF BREATH   VITAMIN D PO Take 1 tablet by mouth daily.        Objective:   BP 122/63   Pulse 78   Ht _0  (1.626 m)   Wt 137 lb (62.1 kg)   SpO2 98%   BMI 23.52 kg/m   Wt Readings from Last 3 Encounters:  06/20/20 137 lb (62.1 kg)  03/12/20 137 lb 8 oz (62.4 kg)  12/07/19 143 lb 6 oz (65 kg)    Physical Exam Vitals and nursing note reviewed.  Constitutional:      General: She is not in acute distress.    Appearance: She is well-developed. She is not diaphoretic.  Eyes:     Conjunctiva/sclera: Conjunctivae normal.  Cardiovascular:     Rate and Rhythm: Normal rate and regular rhythm.     Heart sounds: Normal heart sounds. No murmur heard.   Pulmonary:     Effort: Pulmonary effort is normal. No respiratory distress.     Breath sounds: Normal breath sounds. No wheezing.  Musculoskeletal:        General: No tenderness. Normal range of motion.  Skin:    General: Skin is warm and dry.     Findings: No rash.  Neurological:     Mental Status: She is alert and oriented to person, place, and time.     Coordination: Coordination normal.  Psychiatric:        Behavior: Behavior normal.       Assessment & Plan:   Problem List Items Addressed This Visit      Digestive   GERD (gastroesophageal reflux disease)   Relevant Orders   CMP14+EGFR     Other   Anxiety   Relevant Orders   CBC with Differential/Platelet   HLD (hyperlipidemia) - Primary   Relevant Orders   Lipid panel      Continue current medication, no changes, will do blood work today Follow up plan: Return in about 3 months (around 09/18/2020), or if symptoms worsen or fail to  improve, for Anxiety follow-up.  Counseling provided for all of the vaccine components No orders of the defined types were placed in this encounter.   Caryl Pina, MD [facility] 06/20/2020, 8:21 AM

## 2020-06-30 ENCOUNTER — Other Ambulatory Visit: Payer: Self-pay | Admitting: Family Medicine

## 2020-06-30 DIAGNOSIS — F419 Anxiety disorder, unspecified: Secondary | ICD-10-CM

## 2020-07-02 ENCOUNTER — Telehealth: Payer: Self-pay | Admitting: Family Medicine

## 2020-07-02 DIAGNOSIS — F419 Anxiety disorder, unspecified: Secondary | ICD-10-CM

## 2020-07-02 MED ORDER — LORAZEPAM 1 MG PO TABS: 1.0000 mg | ORAL_TABLET | Freq: Two times a day (BID) | ORAL | 2 refills | Status: DC | PRN

## 2020-07-02 NOTE — Telephone Encounter (Signed)
Sent refill for her Ativan.

## 2020-08-01 ENCOUNTER — Other Ambulatory Visit: Payer: Self-pay | Admitting: Family Medicine

## 2020-08-01 DIAGNOSIS — J4520 Mild intermittent asthma, uncomplicated: Secondary | ICD-10-CM

## 2020-09-19 ENCOUNTER — Ambulatory Visit (INDEPENDENT_AMBULATORY_CARE_PROVIDER_SITE_OTHER): Payer: Medicare HMO | Admitting: Family Medicine

## 2020-09-19 ENCOUNTER — Encounter: Payer: Self-pay | Admitting: Family Medicine

## 2020-09-19 ENCOUNTER — Telehealth: Payer: Self-pay | Admitting: Family Medicine

## 2020-09-19 ENCOUNTER — Other Ambulatory Visit: Payer: Self-pay

## 2020-09-19 ENCOUNTER — Other Ambulatory Visit: Payer: Self-pay | Admitting: Family Medicine

## 2020-09-19 VITALS — BP 117/67 | HR 83 | Ht 64.0 in | Wt 144.0 lb

## 2020-09-19 DIAGNOSIS — F419 Anxiety disorder, unspecified: Secondary | ICD-10-CM | POA: Diagnosis not present

## 2020-09-19 DIAGNOSIS — R300 Dysuria: Secondary | ICD-10-CM | POA: Diagnosis not present

## 2020-09-19 LAB — MICROSCOPIC EXAMINATION
Epithelial Cells (non renal): NONE SEEN /hpf (ref 0–10)
RBC, Urine: NONE SEEN /hpf (ref 0–2)

## 2020-09-19 LAB — URINALYSIS, COMPLETE
Bilirubin, UA: NEGATIVE
Glucose, UA: NEGATIVE
Nitrite, UA: POSITIVE — AB
Protein,UA: NEGATIVE
Specific Gravity, UA: 1.02 (ref 1.005–1.030)
Urobilinogen, Ur: 0.2 mg/dL (ref 0.2–1.0)
pH, UA: 5 (ref 5.0–7.5)

## 2020-09-19 MED ORDER — LORATADINE 10 MG PO TABS
ORAL_TABLET | ORAL | 3 refills | Status: DC
Start: 2020-09-19 — End: 2020-11-19

## 2020-09-19 MED ORDER — LORAZEPAM 1 MG PO TABS: 1.0000 mg | ORAL_TABLET | Freq: Two times a day (BID) | ORAL | 2 refills | Status: DC | PRN

## 2020-09-19 MED ORDER — CEPHALEXIN 500 MG PO CAPS: 500.0000 mg | ORAL_CAPSULE | Freq: Four times a day (QID) | ORAL | 0 refills | Status: DC

## 2020-09-19 NOTE — Progress Notes (Signed)
BP 117/67   Pulse 83   Ht 5\' 4"  (1.626 m)   Wt 144 lb (65.3 kg)   SpO2 96%   BMI 24.72 kg/m    Subjective:   Patient ID: Mikayla Wilson, female    DOB: 1931-07-02, 87 y.o.   MRN: [id number]  HPI: Mikayla Wilson is a 87 y.o. female presenting on 09/19/2020 for Medical Management of Chronic Issues, Anxiety, Hyperlipidemia, and Gastroesophageal Reflux   HPI Anxiety recheck Current rx-lorazepam 1 mg twice daily as needed # meds rx-40/month Effectiveness of current meds-doing well Adverse reactions form meds-none except dated memory worsening from her dementia and recently  Pill count performed-No Last drug screen -12/13/2019 ( high risk q72m, moderate risk q28m, low risk yearly ) Urine drug screen today- No Was the NCCSR reviewed-yes  If yes were their any concerning findings? -None  No flowsheet data found.   Controlled substance contract signed on: 12/13/2019  Patient has been having some confusion and some dysuria recently and wants to leave a urine, likely due to gradually worsening dementia but daughter wants to get urine checked just to make sure nothing else is going on.  Relevant past medical, surgical, family and social history reviewed and updated as indicated. Interim medical history since our last visit reviewed. Allergies and medications reviewed and updated.  Review of Systems  Constitutional: Negative for chills and fever.  Eyes: Negative for visual disturbance.  Respiratory: Negative for chest tightness and shortness of breath.   Cardiovascular: Negative for chest pain and leg swelling.  Genitourinary: Positive for dysuria.  Musculoskeletal: Negative for back pain and gait problem.  Skin: Negative for rash.  Neurological: Negative for light-headedness and headaches.  Psychiatric/Behavioral: Positive for confusion. Negative for agitation, behavioral problems, dysphoric mood, self-injury, sleep disturbance and suicidal ideas. The patient is nervous/anxious.    All other systems reviewed and are negative.   Per HPI unless specifically indicated above   Allergies as of 09/19/2020      Reactions   Aspirin Other (See Comments)   "My doctor told me to never take aspirin"   Morphine And Related Other (See Comments)      Medication List       Accurate as of September 19, 2020  9:50 AM. If you have any questions, ask your nurse or doctor.        acetaminophen 325 MG tablet Commonly known as: TYLENOL Take 2 tablets (650 mg total) by mouth every 6 (six) hours as needed for mild pain (or Fever >/= 101).   Advair HFA 115-21 MCG/ACT inhaler Generic drug: fluticasone-salmeterol Inhale 2 puffs by mouth twice daily   alum & mag hydroxide-simeth 200-200-20 MG/5ML suspension Commonly known as: MAALOX/MYLANTA Take 30 mLs by mouth every 4 (four) hours as needed for indigestion.   ENSURE NUTRITION SHAKE PO Take 1-2 Bottles by mouth daily. Reported on 11/30/2015   escitalopram 5 MG tablet Commonly known as: LEXAPRO Take 1 tablet (5 mg total) by mouth daily.   famotidine 20 MG tablet Commonly known as: PEPCID Take 1 tablet (20 mg total) by mouth 2 (two) times daily.   fluticasone 50 MCG/ACT nasal spray Commonly known as: Flonase Place 2 sprays into both nostrils daily.   ketotifen 0.025 % ophthalmic solution Commonly known as: ZADITOR Place 1 drop into both eyes 6 (six) times daily.   loratadine 10 MG tablet Commonly known as: EQ Loratadine TAKE 1 TABLET BY MOUTH ONCE DAILY AS NEEDED FOR ALLERGIES   LORazepam 1 MG  tablet Commonly known as: ATIVAN Take 1 tablet (1 mg total) by mouth 2 (two) times daily as needed for anxiety.   omeprazole 20 MG capsule Commonly known as: PRILOSEC Take 1 capsule (20 mg total) by mouth daily.   rosuvastatin 5 MG tablet Commonly known as: CRESTOR Take 1 tablet (5 mg total) by mouth daily.   SALINE NASAL MIST NA Place 1 spray into the nose 2 (two) times daily.   Ventolin HFA 108 (90 Base) MCG/ACT  inhaler Generic drug: albuterol 2 PUFFS EVERY 6 HOURS AS NEEDED FOR WHEEZING OR SHORTNESS OF BREATH   VITAMIN D PO Take 1 tablet by mouth daily.        Objective:   BP 117/67   Pulse 83   Ht 5\' 4"  (1.626 m)   Wt 144 lb (65.3 kg)   SpO2 96%   BMI 24.72 kg/m   Wt Readings from Last 3 Encounters:  09/19/20 144 lb (65.3 kg)  06/20/20 137 lb (62.1 kg)  03/12/20 137 lb 8 oz (62.4 kg)    Physical Exam Vitals and nursing note reviewed.  Constitutional:      General: She is not in acute distress.    Appearance: She is well-developed and well-nourished. She is not diaphoretic.  Eyes:     Extraocular Movements: EOM normal.     Conjunctiva/sclera: Conjunctivae normal.     Pupils: Pupils are equal, round, and reactive to light.  Cardiovascular:     Rate and Rhythm: Normal rate and regular rhythm.     Pulses: Intact distal pulses.     Heart sounds: Normal heart sounds. No murmur heard.   Pulmonary:     Effort: Pulmonary effort is normal. No respiratory distress.     Breath sounds: Normal breath sounds. No wheezing.  Musculoskeletal:        General: No tenderness or edema. Normal range of motion.  Skin:    General: Skin is warm and dry.     Findings: No rash.  Neurological:     Mental Status: She is alert and oriented to person, place, and time.     Coordination: Coordination normal.  Psychiatric:        Mood and Affect: Mood is anxious. Mood is not depressed.        Behavior: Behavior normal.        Thought Content: Thought content does not include suicidal ideation. Thought content does not include suicidal plan.        Cognition and Memory: Memory is impaired. She exhibits impaired recent memory.       Assessment & Plan:   Problem List Items Addressed This Visit      Other   Anxiety - Primary   Relevant Medications   LORazepam (ATIVAN) 1 MG tablet    Other Visit Diagnoses    Dysuria       Relevant Orders   Urinalysis, Complete   Urine Culture       Patient also needs a refill on her allergy medicine so sent that for her.  We will leave urine and culture to look for any signs of infection that could cause memory issues or dysuria.  Continue lorazepam as is but still encouraged to reduce because of memory issues. Follow up plan: Return in about 3 months (around 12/20/2020), or if symptoms worsen or fail to improve, for Anxiety recheck and blood work for cholesterol and GERD.  Counseling provided for all of the vaccine components No orders of the defined types  were placed in this encounter.   Arville Care, MD [facility] 09/19/2020, 9:50 AM

## 2020-09-19 NOTE — Progress Notes (Unsigned)
Patient's urine came back positive, sent Keflex, will await culture as well

## 2020-09-19 NOTE — Progress Notes (Unsigned)
Left message to call back  

## 2020-09-21 LAB — URINE CULTURE

## 2020-11-17 ENCOUNTER — Other Ambulatory Visit: Payer: Self-pay | Admitting: Family Medicine

## 2020-11-17 DIAGNOSIS — J4520 Mild intermittent asthma, uncomplicated: Secondary | ICD-10-CM

## 2020-12-12 ENCOUNTER — Other Ambulatory Visit: Payer: Self-pay

## 2020-12-12 ENCOUNTER — Encounter: Payer: Self-pay | Admitting: Family Medicine

## 2020-12-12 ENCOUNTER — Ambulatory Visit (INDEPENDENT_AMBULATORY_CARE_PROVIDER_SITE_OTHER): Payer: Medicare HMO | Admitting: Family Medicine

## 2020-12-12 VITALS — BP 106/73 | HR 104 | Temp 99.1°F | Ht 64.0 in | Wt 134.2 lb

## 2020-12-12 DIAGNOSIS — Z79899 Other long term (current) drug therapy: Secondary | ICD-10-CM

## 2020-12-12 DIAGNOSIS — J4531 Mild persistent asthma with (acute) exacerbation: Secondary | ICD-10-CM | POA: Diagnosis not present

## 2020-12-12 DIAGNOSIS — F419 Anxiety disorder, unspecified: Secondary | ICD-10-CM | POA: Diagnosis not present

## 2020-12-12 DIAGNOSIS — K219 Gastro-esophageal reflux disease without esophagitis: Secondary | ICD-10-CM

## 2020-12-12 DIAGNOSIS — E782 Mixed hyperlipidemia: Secondary | ICD-10-CM | POA: Diagnosis not present

## 2020-12-12 MED ORDER — OMEPRAZOLE 20 MG PO CPDR: 20.0000 mg | DELAYED_RELEASE_CAPSULE | Freq: Every day | ORAL | 3 refills | Status: DC

## 2020-12-12 MED ORDER — ESCITALOPRAM OXALATE 5 MG PO TABS: 5.0000 mg | ORAL_TABLET | Freq: Every day | ORAL | 3 refills | Status: DC

## 2020-12-12 MED ORDER — FAMOTIDINE 20 MG PO TABS: 20.0000 mg | ORAL_TABLET | Freq: Two times a day (BID) | ORAL | 3 refills | Status: DC

## 2020-12-12 MED ORDER — ROSUVASTATIN CALCIUM 5 MG PO TABS: 5.0000 mg | ORAL_TABLET | Freq: Every day | ORAL | 3 refills | Status: DC

## 2020-12-12 MED ORDER — LORAZEPAM 1 MG PO TABS: 1.0000 mg | ORAL_TABLET | Freq: Two times a day (BID) | ORAL | 2 refills | Status: DC | PRN

## 2020-12-12 MED ORDER — LORATADINE 10 MG PO TABS: 10.0000 mg | ORAL_TABLET | Freq: Every day | ORAL | 3 refills | Status: DC

## 2020-12-12 MED ORDER — PREDNISONE 20 MG PO TABS: ORAL_TABLET | ORAL | 0 refills | Status: DC

## 2020-12-12 MED ORDER — ALBUTEROL SULFATE HFA 108 (90 BASE) MCG/ACT IN AERS: 1.0000 | INHALATION_SPRAY | Freq: Four times a day (QID) | RESPIRATORY_TRACT | 2 refills | Status: DC | PRN

## 2020-12-12 NOTE — Addendum Note (Signed)
Addended by: Arville Care on: 12/12/2020 09:24 AM   Modules accepted: Orders

## 2020-12-12 NOTE — Addendum Note (Signed)
Addended by: Adella Hare B on: 12/12/2020 10:15 AM   Modules accepted: Orders

## 2020-12-12 NOTE — Progress Notes (Addendum)
BP 106/73   Pulse (!) 104   Temp 99.1 F (37.3 C)   Ht 5\' 4"  (1.626 m)   Wt 134 lb 3.2 oz (60.9 kg)   SpO2 97%   BMI 23.04 kg/m    Subjective:   Patient ID: Mikayla Wilson, female    DOB: 09-Aug-1930, 87 y.o.   MRN: [id number]  HPI: Mikayla Wilson is a 87 y.o. female presenting on 12/12/2020 for Medical Management of Chronic Issues   HPI Hyperlipidemia Patient is coming in for recheck of his hyperlipidemia. The patient is currently taking Crestor. They deny any issues with myalgias or history of liver damage from it. They deny any focal numbness or weakness or chest pain.   GERD Patient is currently on omeprazole.  She denies any major symptoms or abdominal pain or belching or burping. She denies any blood in her stool or lightheadedness or dizziness.   Asthma  Patient is coming in today and she is feeling a little more chest tightness and congestion and coughing spells.  She is using her asthma inhaler but feels like it is not doing as well as it normally does.  She says she has been feeling some wheezing but no fevers or chills.  She has been using the Advair but does not think she has a Ventolin  Patient has been having some worsening memory issues and emotional issues related to diet including being somewhat antagonistic towards her caretaker which is her daughter [name].  There has been a lot more concerned with this because of family's boyfriend just had congestive heart failure is coming home needing more one-on-one assistance and therapy and she does not know if she can be able to take care of both [name] and her boyfriend and their current states.  Relevant past medical, surgical, family and social history reviewed and updated as indicated. Interim medical history since our last visit reviewed. Allergies and medications reviewed and updated.  Review of Systems  Constitutional: Negative for chills and fever.  Eyes: Negative for visual disturbance.  Respiratory:  Negative for chest tightness and shortness of breath.   Cardiovascular: Negative for chest pain and leg swelling.  Musculoskeletal: Negative for back pain and gait problem.  Skin: Negative for rash.  Neurological: Negative for dizziness, light-headedness and headaches.  Psychiatric/Behavioral: Positive for confusion, decreased concentration and dysphoric mood. Negative for agitation, behavioral problems, self-injury, sleep disturbance and suicidal ideas. The patient is nervous/anxious.   All other systems reviewed and are negative.   Per HPI unless specifically indicated above   Allergies as of 12/12/2020      Reactions   Aspirin Other (See Comments)   "My doctor told me to never take aspirin"   Morphine And Related Other (See Comments)      Medication List       Accurate as of December 12, 2020  9:18 AM. If you have any questions, ask your nurse or doctor.        acetaminophen 325 MG tablet Commonly known as: TYLENOL Take 2 tablets (650 mg total) by mouth every 6 (six) hours as needed for mild pain (or Fever >/= 101).   Advair HFA 115-21 MCG/ACT inhaler Generic drug: fluticasone-salmeterol Inhale 2 puffs by mouth twice daily   albuterol 108 (90 Base) MCG/ACT inhaler Commonly known as: Ventolin HFA Inhale 1-2 puffs into the lungs every 6 (six) hours as needed for wheezing or shortness of breath. What changed: See the new instructions. Changed by: December 14, 2020, MD  alum & mag hydroxide-simeth 200-200-20 MG/5ML suspension Commonly known as: MAALOX/MYLANTA Take 30 mLs by mouth every 4 (four) hours as needed for indigestion.   cephALEXin 500 MG capsule Commonly known as: KEFLEX Take 1 capsule (500 mg total) by mouth 4 (four) times daily.   ENSURE NUTRITION SHAKE PO Take 1-2 Bottles by mouth daily. Reported on 11/30/2015   escitalopram 5 MG tablet Commonly known as: LEXAPRO Take 1 tablet (5 mg total) by mouth daily.   famotidine 20 MG tablet Commonly known as:  PEPCID Take 1 tablet (20 mg total) by mouth 2 (two) times daily.   fluticasone 50 MCG/ACT nasal spray Commonly known as: Flonase Place 2 sprays into both nostrils daily.   ketotifen 0.025 % ophthalmic solution Commonly known as: ZADITOR Place 1 drop into both eyes 6 (six) times daily.   loratadine 10 MG tablet Commonly known as: EQ Loratadine Take 1 tablet (10 mg total) by mouth daily. TAKE 1 TABLET BY MOUTH ONCE DAILY AS NEEDED FOR ALLERGIES What changed:   how much to take  how to take this  when to take this Changed by: Elige Radon Melinda Pottinger, MD   LORazepam 1 MG tablet Commonly known as: ATIVAN Take 1 tablet (1 mg total) by mouth 2 (two) times daily as needed for anxiety.   omeprazole 20 MG capsule Commonly known as: PRILOSEC Take 1 capsule (20 mg total) by mouth daily.   predniSONE 20 MG tablet Commonly known as: DELTASONE 2 po at same time daily for 5 days Started by: Elige Radon Stefanee Mckell, MD   rosuvastatin 5 MG tablet Commonly known as: CRESTOR Take 1 tablet (5 mg total) by mouth daily.   SALINE NASAL MIST NA Place 1 spray into the nose 2 (two) times daily.   VITAMIN D PO Take 1 tablet by mouth daily.        Objective:   BP 106/73   Pulse (!) 104   Temp 99.1 F (37.3 C)   Ht 5\' 4"  (1.626 m)   Wt 134 lb 3.2 oz (60.9 kg)   SpO2 97%   BMI 23.04 kg/m   Wt Readings from Last 3 Encounters:  12/12/20 134 lb 3.2 oz (60.9 kg)  09/19/20 144 lb (65.3 kg)  06/20/20 137 lb (62.1 kg)    Physical Exam Vitals and nursing note reviewed.  Constitutional:      General: She is not in acute distress.    Appearance: She is well-developed. She is not diaphoretic.  Eyes:     Conjunctiva/sclera: Conjunctivae normal.  Cardiovascular:     Rate and Rhythm: Normal rate and regular rhythm.     Heart sounds: Normal heart sounds. No murmur heard.   Pulmonary:     Effort: Pulmonary effort is normal. No respiratory distress.     Breath sounds: Normal breath sounds. No  stridor. No wheezing, rhonchi or rales.  Chest:     Chest wall: No tenderness.  Musculoskeletal:        General: No tenderness. Normal range of motion.  Skin:    General: Skin is warm and dry.     Findings: No rash.  Neurological:     Mental Status: She is alert and oriented to person, place, and time.     Coordination: Coordination normal.  Psychiatric:        Behavior: Behavior normal.       Assessment & Plan:   Problem List Items Addressed This Visit      Respiratory   Asthma  Relevant Medications   predniSONE (DELTASONE) 20 MG tablet   albuterol (VENTOLIN HFA) 108 (90 Base) MCG/ACT inhaler     Digestive   GERD (gastroesophageal reflux disease)   Relevant Medications   omeprazole (PRILOSEC) 20 MG capsule   famotidine (PEPCID) 20 MG tablet     Other   Anxiety   Relevant Medications   LORazepam (ATIVAN) 1 MG tablet   escitalopram (LEXAPRO) 5 MG tablet   HLD (hyperlipidemia)   Relevant Medications   rosuvastatin (CRESTOR) 5 MG tablet    Other Visit Diagnoses    Controlled substance agreement signed    -  Primary   Relevant Orders   ToxASSURE Select 13 (MW), Urine      Continue with current medication, will do blood work, no changes. Follow up plan: Return in about 3 months (around 03/14/2021), or if symptoms worsen or fail to improve, for Anxiety hyperlipidemia recheck.  Counseling provided for all of the vaccine components Orders Placed This Encounter  Procedures  . ToxASSURE Select 13 (MW), Urine    Arville Care, MD [facility] 12/12/2020, 9:18 AM

## 2020-12-13 LAB — LIPID PANEL
Chol/HDL Ratio: 1.9 ratio (ref 0.0–4.4)
Cholesterol, Total: 130 mg/dL (ref 100–199)
HDL: 67 mg/dL (ref >39)
LDL Chol Calc (NIH): 39 mg/dL (ref 0–99)
Triglycerides: 145 mg/dL (ref 0–149)
VLDL Cholesterol Cal: 24 mg/dL (ref 5–40)

## 2020-12-13 LAB — CBC WITH DIFFERENTIAL/PLATELET
Basophils Absolute: 0.1 10*3/uL (ref 0.0–0.2)
Basos: 0 %
EOS (ABSOLUTE): 0.3 10*3/uL (ref 0.0–0.4)
Eos: 3 %
Hematocrit: 39.2 % (ref 34.0–46.6)
Hemoglobin: 13.3 g/dL (ref 11.1–15.9)
Immature Grans (Abs): 0 10*3/uL (ref 0.0–0.1)
Immature Granulocytes: 0 %
Lymphocytes Absolute: 2.8 10*3/uL (ref 0.7–3.1)
Lymphs: 24 %
MCH: 32.6 pg (ref 26.6–33.0)
MCHC: 33.9 g/dL (ref 31.5–35.7)
MCV: 96 fL (ref 79–97)
Monocytes Absolute: 0.5 10*3/uL (ref 0.1–0.9)
Monocytes: 5 %
Neutrophils Absolute: 7.8 10*3/uL — ABNORMAL HIGH (ref 1.4–7.0)
Neutrophils: 68 %
Platelets: 253 10*3/uL (ref 150–450)
RBC: 4.08 x10E6/uL (ref 3.77–5.28)
RDW: 12.8 % (ref 11.7–15.4)
WBC: 11.5 10*3/uL — ABNORMAL HIGH (ref 3.4–10.8)

## 2020-12-13 LAB — CMP14+EGFR
ALT: 16 IU/L (ref 0–32)
AST: 31 IU/L (ref 0–40)
Albumin/Globulin Ratio: 1.5 (ref 1.2–2.2)
Albumin: 4.3 g/dL (ref 3.6–4.6)
Alkaline Phosphatase: 59 IU/L (ref 44–121)
BUN/Creatinine Ratio: 6 — ABNORMAL LOW (ref 12–28)
BUN: 5 mg/dL — ABNORMAL LOW (ref 8–27)
Bilirubin Total: 0.4 mg/dL (ref 0.0–1.2)
CO2: 24 mmol/L (ref 20–29)
Calcium: 9.4 mg/dL (ref 8.7–10.3)
Chloride: 103 mmol/L (ref 96–106)
Creatinine, Ser: 0.86 mg/dL (ref 0.57–1.00)
Globulin, Total: 2.9 g/dL (ref 1.5–4.5)
Glucose: 104 mg/dL — ABNORMAL HIGH (ref 65–99)
Potassium: 3.9 mmol/L (ref 3.5–5.2)
Sodium: 142 mmol/L (ref 134–144)
Total Protein: 7.2 g/dL (ref 6.0–8.5)
eGFR: 65 mL/min/{1.73_m2} (ref >59)

## 2020-12-16 LAB — DRUG SCREEN 10 W/CONF, SERUM
Amphetamines, IA: NEGATIVE ng/mL
Barbiturates, IA: NEGATIVE ug/mL
Benzodiazepines, IA: NEGATIVE ng/mL
Cocaine & Metabolite, IA: NEGATIVE ng/mL
Methadone, IA: NEGATIVE ng/mL
Opiates, IA: NEGATIVE ng/mL
Oxycodones, IA: NEGATIVE ng/mL
Phencyclidine, IA: NEGATIVE ng/mL
Propoxyphene, IA: NEGATIVE ng/mL
THC(Marijuana) Metabolite, IA: NEGATIVE ng/mL

## 2020-12-19 ENCOUNTER — Telehealth: Payer: Self-pay | Admitting: Family Medicine

## 2020-12-20 NOTE — Telephone Encounter (Signed)
Erroneous phone encounter

## 2021-02-11 ENCOUNTER — Other Ambulatory Visit: Payer: Self-pay | Admitting: Family Medicine

## 2021-02-11 DIAGNOSIS — J4520 Mild intermittent asthma, uncomplicated: Secondary | ICD-10-CM

## 2021-03-20 ENCOUNTER — Encounter: Payer: Self-pay | Admitting: Family Medicine

## 2021-03-20 ENCOUNTER — Ambulatory Visit (INDEPENDENT_AMBULATORY_CARE_PROVIDER_SITE_OTHER): Payer: Medicare HMO | Admitting: Family Medicine

## 2021-03-20 ENCOUNTER — Other Ambulatory Visit: Payer: Self-pay

## 2021-03-20 VITALS — BP 108/70 | HR 82 | Ht 64.0 in | Wt 138.0 lb

## 2021-03-20 DIAGNOSIS — D72829 Elevated white blood cell count, unspecified: Secondary | ICD-10-CM

## 2021-03-20 DIAGNOSIS — F419 Anxiety disorder, unspecified: Secondary | ICD-10-CM | POA: Diagnosis not present

## 2021-03-20 DIAGNOSIS — E782 Mixed hyperlipidemia: Secondary | ICD-10-CM | POA: Diagnosis not present

## 2021-03-20 DIAGNOSIS — R739 Hyperglycemia, unspecified: Secondary | ICD-10-CM

## 2021-03-20 LAB — BAYER DCA HB A1C WAIVED: HB A1C (BAYER DCA - WAIVED): 5.3 % (ref 4.8–5.6)

## 2021-03-20 MED ORDER — LORAZEPAM 1 MG PO TABS: 1.0000 mg | ORAL_TABLET | Freq: Two times a day (BID) | ORAL | 2 refills | Status: DC | PRN

## 2021-03-20 NOTE — Progress Notes (Signed)
BP 108/70   Pulse 82   Ht 5\' 4"  (1.626 m)   Wt 138 lb (62.6 kg)   SpO2 96%   BMI 23.69 kg/m    Subjective:   Patient ID: Mikayla Wilson, female    DOB: Oct 20, 1930, 87 y.o.   MRN: [id number]  HPI: Mikayla Wilson is a 87 y.o. female presenting on 03/20/2021 for Medical Management of Chronic Issues, Hyperlipidemia, and Anxiety   HPI Anxiety Current rx-lorazepam 1 mg twice daily as needed # meds rx-40 Effectiveness of current meds-working well Adverse reactions form meds-none  Pill count performed-No Last drug screen -12/21/2020 ( high risk q20m, moderate risk q33m, low risk yearly ) Urine drug screen today- No Was the NCCSR reviewed-yes  If yes were their any concerning findings? -Sometimes this feels a little bit early but and sometimes feels later as well  No flowsheet data found.   Controlled substance contract signed on: 12/21/2020  Patient had elevated blood sugar and elevated white count last blood work, we will recheck those today. Denies any signs of infection.  Denies any issues with blood sugar that she knows of.  Hyperlipidemia Patient is coming in for recheck of his hyperlipidemia. The patient is currently taking Crestor. They deny any issues with myalgias or history of liver damage from it. They deny any focal numbness or weakness or chest pain.   Relevant past medical, surgical, family and social history reviewed and updated as indicated. Interim medical history since our last visit reviewed. Allergies and medications reviewed and updated.  Review of Systems  Constitutional:  Negative for chills and fever.  Eyes:  Negative for visual disturbance.  Respiratory:  Negative for chest tightness and shortness of breath.   Cardiovascular:  Negative for chest pain and leg swelling.  Skin:  Negative for rash.  Neurological:  Negative for light-headedness and headaches.  Psychiatric/Behavioral:  Negative for agitation and behavioral problems.   All other systems  reviewed and are negative.  Per HPI unless specifically indicated above   Allergies as of 03/20/2021       Reactions   Aspirin Other (See Comments)   "My doctor told me to never take aspirin"   Morphine And Related Other (See Comments)        Medication List        Accurate as of March 20, 2021 11:05 AM. If you have any questions, ask your nurse or doctor.          STOP taking these medications    cephALEXin 500 MG capsule Commonly known as: KEFLEX Stopped by: March 22, 2021 Ranelle Auker, MD   predniSONE 20 MG tablet Commonly known as: DELTASONE Stopped by: Elige Radon Clessie Karras, MD       TAKE these medications    acetaminophen 325 MG tablet Commonly known as: TYLENOL Take 2 tablets (650 mg total) by mouth every 6 (six) hours as needed for mild pain (or Fever >/= 101).   Advair HFA 115-21 MCG/ACT inhaler Generic drug: fluticasone-salmeterol Inhale 2 puffs by mouth twice daily   albuterol 108 (90 Base) MCG/ACT inhaler Commonly known as: Ventolin HFA Inhale 1-2 puffs into the lungs every 6 (six) hours as needed for wheezing or shortness of breath.   alum & mag hydroxide-simeth 200-200-20 MG/5ML suspension Commonly known as: MAALOX/MYLANTA Take 30 mLs by mouth every 4 (four) hours as needed for indigestion.   ENSURE NUTRITION SHAKE PO Take 1-2 Bottles by mouth daily. Reported on 11/30/2015   escitalopram 5 MG tablet Commonly  known as: LEXAPRO Take 1 tablet (5 mg total) by mouth daily.   famotidine 20 MG tablet Commonly known as: PEPCID Take 1 tablet (20 mg total) by mouth 2 (two) times daily.   fluticasone 50 MCG/ACT nasal spray Commonly known as: FLONASE Use 2 spray(s) in each nostril once daily   ketotifen 0.025 % ophthalmic solution Commonly known as: ZADITOR Place 1 drop into both eyes 6 (six) times daily.   loratadine 10 MG tablet Commonly known as: EQ Loratadine Take 1 tablet (10 mg total) by mouth daily. TAKE 1 TABLET BY MOUTH ONCE DAILY AS NEEDED  FOR ALLERGIES   LORazepam 1 MG tablet Commonly known as: ATIVAN Take 1 tablet (1 mg total) by mouth 2 (two) times daily as needed for anxiety.   omeprazole 20 MG capsule Commonly known as: PRILOSEC Take 1 capsule (20 mg total) by mouth daily.   rosuvastatin 5 MG tablet Commonly known as: CRESTOR Take 1 tablet (5 mg total) by mouth daily.   SALINE NASAL MIST NA Place 1 spray into the nose 2 (two) times daily.   VITAMIN D PO Take 1 tablet by mouth daily.         Objective:   BP 108/70   Pulse 82   Ht 5\' 4"  (1.626 m)   Wt 138 lb (62.6 kg)   SpO2 96%   BMI 23.69 kg/m   Wt Readings from Last 3 Encounters:  03/20/21 138 lb (62.6 kg)  12/12/20 134 lb 3.2 oz (60.9 kg)  09/19/20 144 lb (65.3 kg)    Physical Exam Vitals and nursing note reviewed.  Constitutional:      General: She is not in acute distress.    Appearance: She is well-developed. She is not diaphoretic.  Eyes:     Conjunctiva/sclera: Conjunctivae normal.  Cardiovascular:     Rate and Rhythm: Normal rate and regular rhythm.     Heart sounds: Normal heart sounds. No murmur heard. Pulmonary:     Effort: Pulmonary effort is normal. No respiratory distress.     Breath sounds: Normal breath sounds. No wheezing.  Musculoskeletal:        General: No tenderness. Normal range of motion.  Skin:    General: Skin is warm and dry.     Findings: No rash.  Neurological:     Mental Status: She is alert and oriented to person, place, and time.     Coordination: Coordination normal.  Psychiatric:        Behavior: Behavior normal.      Assessment & Plan:   Problem List Items Addressed This Visit       Other   Anxiety   Relevant Medications   LORazepam (ATIVAN) 1 MG tablet   HLD (hyperlipidemia) - Primary   Other Visit Diagnoses     Leukocytosis, unspecified type       Relevant Orders   CBC with Differential/Platelet   Elevated blood sugar       Relevant Orders   Bayer DCA Hb A1c Waived        Continue current medication, no changes will check blood work. Follow up plan: Return in about 3 months (around 06/19/2021), or if symptoms worsen or fail to improve, for Anxiety and chronic medical issues recheck.  Counseling provided for all of the vaccine components Orders Placed This Encounter  Procedures   CBC with Differential/Platelet   Bayer DCA Hb A1c Waived    14/01/2021, MD [facility] 03/20/2021, 11:05 AM

## 2021-03-21 LAB — CBC WITH DIFFERENTIAL/PLATELET
Basophils Absolute: 0.1 10*3/uL (ref 0.0–0.2)
Basos: 1 %
EOS (ABSOLUTE): 0.2 10*3/uL (ref 0.0–0.4)
Eos: 3 %
Hematocrit: 38.5 % (ref 34.0–46.6)
Hemoglobin: 13.1 g/dL (ref 11.1–15.9)
Immature Grans (Abs): 0 10*3/uL (ref 0.0–0.1)
Immature Granulocytes: 0 %
Lymphocytes Absolute: 2.7 10*3/uL (ref 0.7–3.1)
Lymphs: 29 %
MCH: 32.8 pg (ref 26.6–33.0)
MCHC: 34 g/dL (ref 31.5–35.7)
MCV: 97 fL (ref 79–97)
Monocytes Absolute: 0.4 10*3/uL (ref 0.1–0.9)
Monocytes: 5 %
Neutrophils Absolute: 5.9 10*3/uL (ref 1.4–7.0)
Neutrophils: 62 %
Platelets: 192 10*3/uL (ref 150–450)
RBC: 3.99 x10E6/uL (ref 3.77–5.28)
RDW: 12.3 % (ref 11.7–15.4)
WBC: 9.3 10*3/uL (ref 3.4–10.8)

## 2021-04-15 ENCOUNTER — Other Ambulatory Visit: Payer: Self-pay

## 2021-04-15 ENCOUNTER — Ambulatory Visit (INDEPENDENT_AMBULATORY_CARE_PROVIDER_SITE_OTHER): Payer: Medicare HMO

## 2021-04-15 DIAGNOSIS — Z23 Encounter for immunization: Secondary | ICD-10-CM | POA: Diagnosis not present

## 2021-07-27 ENCOUNTER — Other Ambulatory Visit: Payer: Self-pay | Admitting: Family Medicine

## 2021-07-27 DIAGNOSIS — F419 Anxiety disorder, unspecified: Secondary | ICD-10-CM

## 2021-07-29 NOTE — Telephone Encounter (Signed)
Patient has a follow up on 07/31/21.

## 2021-07-31 ENCOUNTER — Ambulatory Visit (INDEPENDENT_AMBULATORY_CARE_PROVIDER_SITE_OTHER): Payer: Medicare HMO | Admitting: Family Medicine

## 2021-07-31 ENCOUNTER — Encounter: Payer: Self-pay | Admitting: Family Medicine

## 2021-07-31 VITALS — BP 98/61 | HR 73 | Temp 97.6°F | Ht 64.0 in | Wt 141.0 lb

## 2021-07-31 DIAGNOSIS — K219 Gastro-esophageal reflux disease without esophagitis: Secondary | ICD-10-CM

## 2021-07-31 DIAGNOSIS — E782 Mixed hyperlipidemia: Secondary | ICD-10-CM

## 2021-07-31 DIAGNOSIS — Z23 Encounter for immunization: Secondary | ICD-10-CM

## 2021-07-31 DIAGNOSIS — F419 Anxiety disorder, unspecified: Secondary | ICD-10-CM | POA: Diagnosis not present

## 2021-07-31 MED ORDER — LORAZEPAM 1 MG PO TABS: 1.0000 mg | ORAL_TABLET | Freq: Two times a day (BID) | ORAL | 2 refills | Status: DC | PRN

## 2021-07-31 NOTE — Progress Notes (Signed)
BP 98/61    Pulse 73    Temp 97.6 F (36.4 C) (Temporal)    Ht 5\' 4"  (1.626 m)    Wt 141 lb (64 kg)    SpO2 97%    BMI 24.20 kg/m    Subjective:   Patient ID: Mikayla Wilson, female    DOB: 12/17/1930, 88 y.o.   MRN: [id number]  HPI: Mikayla Wilson is a 88 y.o. female presenting on 07/31/2021 for Medical Management of Chronic Issues   HPI Anxiety recheck Current rx-Ativan twice daily as needed # meds rx-40/month Effectiveness of current meds-works well Adverse reactions form meds-none  Pill count performed-No Last drug screen -12/21/2020 ( high risk q55m, moderate risk q62m, low risk yearly ) Urine drug screen today- No Was the [place] reviewed-yes  If yes were their any concerning findings? -None  No flowsheet data found.   Controlled substance contract signed on: 12/21/2020  Hyperlipidemia Patient is coming in for recheck of his hyperlipidemia. The patient is currently taking Crestor. They deny any issues with myalgias or history of liver damage from it. They deny any focal numbness or weakness or chest pain.   GERD Patient is currently on omeprazole.  She denies any major symptoms or abdominal pain or belching or burping. She denies any blood in her stool or lightheadedness or dizziness.   Relevant past medical, surgical, family and social history reviewed and updated as indicated. Interim medical history since our last visit reviewed. Allergies and medications reviewed and updated.  Review of Systems  Constitutional:  Negative for fever.  Eyes:  Negative for redness and visual disturbance.  Respiratory:  Negative for chest tightness and shortness of breath.   Cardiovascular:  Negative for chest pain and leg swelling.  Genitourinary:  Negative for difficulty urinating and dysuria.  Musculoskeletal:  Negative for back pain and gait problem.  Skin:  Negative for rash.  Neurological:  Negative for light-headedness and headaches.  Psychiatric/Behavioral:  Negative for  agitation and behavioral problems.   All other systems reviewed and are negative.  Per HPI unless specifically indicated above   Allergies as of 07/31/2021       Reactions   Aspirin Other (See Comments)   "My doctor told me to never take aspirin"   Morphine And Related Other (See Comments)        Medication List        Accurate as of July 31, 2021 11:25 AM. If you have any questions, ask your nurse or doctor.          acetaminophen 325 MG tablet Commonly known as: TYLENOL Take 2 tablets (650 mg total) by mouth every 6 (six) hours as needed for mild pain (or Fever >/= 101).   Advair HFA 115-21 MCG/ACT inhaler Generic drug: fluticasone-salmeterol Inhale 2 puffs by mouth twice daily   albuterol 108 (90 Base) MCG/ACT inhaler Commonly known as: Ventolin HFA Inhale 1-2 puffs into the lungs every 6 (six) hours as needed for wheezing or shortness of breath.   alum & mag hydroxide-simeth 200-200-20 MG/5ML suspension Commonly known as: MAALOX/MYLANTA Take 30 mLs by mouth every 4 (four) hours as needed for indigestion.   ENSURE NUTRITION SHAKE PO Take 1-2 Bottles by mouth daily. Reported on 11/30/2015   escitalopram 5 MG tablet Commonly known as: LEXAPRO Take 1 tablet (5 mg total) by mouth daily.   famotidine 20 MG tablet Commonly known as: PEPCID Take 1 tablet (20 mg total) by mouth 2 (two) times daily.  fluticasone 50 MCG/ACT nasal spray Commonly known as: FLONASE Use 2 spray(s) in each nostril once daily   ketotifen 0.025 % ophthalmic solution Commonly known as: ZADITOR Place 1 drop into both eyes 6 (six) times daily.   loratadine 10 MG tablet Commonly known as: EQ Loratadine Take 1 tablet (10 mg total) by mouth daily. TAKE 1 TABLET BY MOUTH ONCE DAILY AS NEEDED FOR ALLERGIES   LORazepam 1 MG tablet Commonly known as: ATIVAN Take 1 tablet (1 mg total) by mouth 2 (two) times daily as needed for anxiety.   omeprazole 20 MG capsule Commonly known as:  PRILOSEC Take 1 capsule (20 mg total) by mouth daily.   rosuvastatin 5 MG tablet Commonly known as: CRESTOR Take 1 tablet (5 mg total) by mouth daily.   SALINE NASAL MIST NA Place 1 spray into the nose 2 (two) times daily.   VITAMIN D PO Take 1 tablet by mouth daily.         Objective:   BP 98/61    Pulse 73    Temp 97.6 F (36.4 C) (Temporal)    Ht 5\' 4"  (1.626 m)    Wt 141 lb (64 kg)    SpO2 97%    BMI 24.20 kg/m   Wt Readings from Last 3 Encounters:  07/31/21 141 lb (64 kg)  03/20/21 138 lb (62.6 kg)  12/12/20 134 lb 3.2 oz (60.9 kg)    Physical Exam Vitals and nursing note reviewed.  Constitutional:      General: She is not in acute distress.    Appearance: She is well-developed. She is not diaphoretic.  Eyes:     Conjunctiva/sclera: Conjunctivae normal.  Cardiovascular:     Rate and Rhythm: Normal rate and regular rhythm.     Heart sounds: Normal heart sounds. No murmur heard. Pulmonary:     Effort: Pulmonary effort is normal. No respiratory distress.     Breath sounds: Normal breath sounds. No wheezing.  Musculoskeletal:        General: No tenderness. Normal range of motion.  Skin:    General: Skin is warm and dry.     Findings: No rash.  Neurological:     Mental Status: She is alert and oriented to person, place, and time.     Coordination: Coordination normal.  Psychiatric:        Behavior: Behavior normal.      Assessment & Plan:   Problem List Items Addressed This Visit       Digestive   GERD (gastroesophageal reflux disease)     Other   Anxiety - Primary   Relevant Medications   LORazepam (ATIVAN) 1 MG tablet   HLD (hyperlipidemia)   Other Visit Diagnoses     Need for shingles vaccine           Continue current medicine, no changes. Follow up plan: Return in about 3 months (around 10/29/2021), or if symptoms worsen or fail to improve, for Anxiety recheck.  Counseling provided for all of the vaccine components No orders of the  defined types were placed in this encounter.   Caryl Pina, MD [facility] 07/31/2021, 11:25 AM

## 2021-07-31 NOTE — Addendum Note (Signed)
Addended byDory Peru on: 07/31/2021 03:09 PM   Modules accepted: Orders

## 2021-10-04 ENCOUNTER — Other Ambulatory Visit: Payer: Self-pay | Admitting: Family Medicine

## 2021-10-04 DIAGNOSIS — J4520 Mild intermittent asthma, uncomplicated: Secondary | ICD-10-CM

## 2021-10-30 ENCOUNTER — Ambulatory Visit (INDEPENDENT_AMBULATORY_CARE_PROVIDER_SITE_OTHER): Payer: Medicare HMO | Admitting: Family Medicine

## 2021-10-30 ENCOUNTER — Encounter: Payer: Self-pay | Admitting: Family Medicine

## 2021-10-30 VITALS — BP 119/74 | HR 87 | Ht 64.0 in | Wt 147.0 lb

## 2021-10-30 DIAGNOSIS — E782 Mixed hyperlipidemia: Secondary | ICD-10-CM

## 2021-10-30 DIAGNOSIS — Z23 Encounter for immunization: Secondary | ICD-10-CM

## 2021-10-30 DIAGNOSIS — K219 Gastro-esophageal reflux disease without esophagitis: Secondary | ICD-10-CM

## 2021-10-30 DIAGNOSIS — J4520 Mild intermittent asthma, uncomplicated: Secondary | ICD-10-CM | POA: Diagnosis not present

## 2021-10-30 DIAGNOSIS — J4531 Mild persistent asthma with (acute) exacerbation: Secondary | ICD-10-CM

## 2021-10-30 DIAGNOSIS — Z79899 Other long term (current) drug therapy: Secondary | ICD-10-CM | POA: Diagnosis not present

## 2021-10-30 DIAGNOSIS — F419 Anxiety disorder, unspecified: Secondary | ICD-10-CM | POA: Diagnosis not present

## 2021-10-30 MED ORDER — LORAZEPAM 1 MG PO TABS: 1.0000 mg | ORAL_TABLET | Freq: Two times a day (BID) | ORAL | 2 refills | Status: DC | PRN

## 2021-10-30 MED ORDER — LORATADINE 10 MG PO TABS
10.0000 mg | ORAL_TABLET | Freq: Every day | ORAL | 3 refills | Status: DC
Start: 2021-10-30 — End: 2022-08-01

## 2021-10-30 MED ORDER — FAMOTIDINE 20 MG PO TABS: 20.0000 mg | ORAL_TABLET | Freq: Two times a day (BID) | ORAL | 3 refills | Status: DC

## 2021-10-30 MED ORDER — OMEPRAZOLE 20 MG PO CPDR: 20.0000 mg | DELAYED_RELEASE_CAPSULE | Freq: Every day | ORAL | 3 refills | Status: DC

## 2021-10-30 MED ORDER — ADVAIR HFA 115-21 MCG/ACT IN AERO: 2.0000 | INHALATION_SPRAY | Freq: Two times a day (BID) | RESPIRATORY_TRACT | 3 refills | Status: DC

## 2021-10-30 MED ORDER — ADVAIR HFA 115-21 MCG/ACT IN AERO: 2.0000 | INHALATION_SPRAY | Freq: Two times a day (BID) | RESPIRATORY_TRACT | 2 refills | Status: DC

## 2021-10-30 MED ORDER — ESCITALOPRAM OXALATE 5 MG PO TABS: 5.0000 mg | ORAL_TABLET | Freq: Every day | ORAL | 3 refills | Status: DC

## 2021-10-30 MED ORDER — ALBUTEROL SULFATE HFA 108 (90 BASE) MCG/ACT IN AERS: 1.0000 | INHALATION_SPRAY | Freq: Four times a day (QID) | RESPIRATORY_TRACT | 2 refills | Status: DC | PRN

## 2021-10-30 MED ORDER — FLUTICASONE PROPIONATE 50 MCG/ACT NA SUSP: NASAL | 3 refills | Status: DC

## 2021-10-30 MED ORDER — ROSUVASTATIN CALCIUM 5 MG PO TABS: 5.0000 mg | ORAL_TABLET | Freq: Every day | ORAL | 3 refills | Status: DC

## 2021-10-30 NOTE — Progress Notes (Signed)
? ?BP 119/74   Pulse 87   Ht $R'5\' 4"'gV$  (1.626 m)   Wt 147 lb (66.7 kg)   SpO2 94%   BMI 25.23 kg/m?   ? ?Subjective:  ? ?Patient ID: Mikayla Wilson, female    DOB: [birth year], 88 y.o.   MRN: [id number] ? ?HPI: ?Mikayla Wilson is a 88 y.o. female presenting on 10/30/2021 for Medical Management of Chronic Issues, Anxiety, and Hyperlipidemia ? ? ?HPI ?Anxiety recheck ?Current rx-lorazepam twice daily as needed for anxiety ?# meds rx-40 mg/month ?Effectiveness of current meds-works well ?Adverse reactions form meds-none ? ?Pill count performed-No ?Last drug screen -12/21/2020 ?( high risk q77m, moderate risk q61m, low risk yearly ) ?Urine drug screen today- Yes ?Was the [place] reviewed-yes ? If yes were their any concerning findings? -None ? ?No flowsheet data found. ? ? ?Controlled substance contract signed on: Today ? ?Hyperlipidemia ?Patient is coming in for recheck of his hyperlipidemia. The patient is currently taking Crestor. They deny any issues with myalgias or history of liver damage from it. They deny any focal numbness or weakness or chest pain.  ? ?GERD ?Patient is currently on omeprazole and Pepcid.  She denies any major symptoms or abdominal pain or belching or burping. She denies any blood in her stool or lightheadedness or dizziness.  ? ?Asthma ?Patient is coming in for asthma recheck.  She does take albuterol and Advair for it.  She denies any major breathing issues and is doing well on these.  She is getting some cough and congestion with the seasonal allergies right now but no major wheezing or shortness of breath. ? ?Relevant past medical, surgical, family and social history reviewed and updated as indicated. Interim medical history since our last visit reviewed. ?Allergies and medications reviewed and updated. ? ?Review of Systems  ?Constitutional:  Negative for chills and fever.  ?HENT:  Positive for congestion. Negative for ear discharge and ear pain.   ?Eyes:  Negative for redness and visual  disturbance.  ?Respiratory:  Positive for cough. Negative for chest tightness and shortness of breath.   ?Cardiovascular:  Negative for chest pain and leg swelling.  ?Genitourinary:  Negative for difficulty urinating and dysuria.  ?Musculoskeletal:  Negative for back pain and gait problem.  ?Skin:  Negative for rash.  ?Neurological:  Negative for light-headedness and headaches.  ?Psychiatric/Behavioral:  Negative for agitation and behavioral problems.   ?All other systems reviewed and are negative. ? ?Per HPI unless specifically indicated above ? ? ?Allergies as of 10/30/2021   ? ?   Reactions  ? Aspirin Other (See Comments)  ? "My doctor told me to never take aspirin"  ? Morphine And Related Other (See Comments)  ? ?  ? ?  ?Medication List  ?  ? ?  ? Accurate as of October 30, 2021  3:36 PM. If you have any questions, ask your nurse or doctor.  ?  ?  ? ?  ? ?acetaminophen 325 MG tablet ?Commonly known as: TYLENOL ?Take 2 tablets (650 mg total) by mouth every 6 (six) hours as needed for mild pain (or Fever >/= 101). ?  ?Advair HFA 115-21 MCG/ACT inhaler ?Generic drug: fluticasone-salmeterol ?Inhale 2 puffs into the lungs 2 (two) times daily. ?  ?albuterol 108 (90 Base) MCG/ACT inhaler ?Commonly known as: Ventolin HFA ?Inhale 1-2 puffs into the lungs every 6 (six) hours as needed for wheezing or shortness of breath. ?  ?alum & mag hydroxide-simeth 200-200-20 MG/5ML suspension ?Commonly known as: MAALOX/MYLANTA ?  Take 30 mLs by mouth every 4 (four) hours as needed for indigestion. ?  ?ENSURE NUTRITION SHAKE PO ?Take 1-2 Bottles by mouth daily. Reported on 11/30/2015 ?  ?escitalopram 5 MG tablet ?Commonly known as: LEXAPRO ?Take 1 tablet (5 mg total) by mouth daily. ?  ?famotidine 20 MG tablet ?Commonly known as: PEPCID ?Take 1 tablet (20 mg total) by mouth 2 (two) times daily. ?  ?fluticasone 50 MCG/ACT nasal spray ?Commonly known as: FLONASE ?Use 2 spray(s) in each nostril once daily ?  ?ketotifen 0.025 % ophthalmic  solution ?Commonly known as: ZADITOR ?Place 1 drop into both eyes 6 (six) times daily. ?  ?loratadine 10 MG tablet ?Commonly known as: EQ Loratadine ?Take 1 tablet (10 mg total) by mouth daily. TAKE 1 TABLET BY MOUTH ONCE DAILY AS NEEDED FOR ALLERGIES ?  ?LORazepam 1 MG tablet ?Commonly known as: ATIVAN ?Take 1 tablet (1 mg total) by mouth 2 (two) times daily as needed for anxiety. ?  ?omeprazole 20 MG capsule ?Commonly known as: PRILOSEC ?Take 1 capsule (20 mg total) by mouth daily. ?  ?rosuvastatin 5 MG tablet ?Commonly known as: CRESTOR ?Take 1 tablet (5 mg total) by mouth daily. ?  ?SALINE NASAL MIST NA ?Place 1 spray into the nose 2 (two) times daily. ?  ?VITAMIN D PO ?Take 1 tablet by mouth daily. ?  ? ?  ? ? ? ?Objective:  ? ?BP 119/74   Pulse 87   Ht $R'5\' 4"'Du$  (1.626 m)   Wt 147 lb (66.7 kg)   SpO2 94%   BMI 25.23 kg/m?   ?Wt Readings from Last 3 Encounters:  ?10/30/21 147 lb (66.7 kg)  ?07/31/21 141 lb (64 kg)  ?03/20/21 138 lb (62.6 kg)  ?  ?Physical Exam ?Vitals and nursing note reviewed.  ?Constitutional:   ?   General: She is not in acute distress. ?   Appearance: She is well-developed. She is not diaphoretic.  ?Eyes:  ?   Conjunctiva/sclera: Conjunctivae normal.  ?Cardiovascular:  ?   Rate and Rhythm: Normal rate and regular rhythm.  ?   Heart sounds: Normal heart sounds. No murmur heard. ?Pulmonary:  ?   Effort: Pulmonary effort is normal. No respiratory distress.  ?   Breath sounds: Normal breath sounds. No wheezing, rhonchi or rales.  ?Musculoskeletal:     ?   General: No tenderness. Normal range of motion.  ?Skin: ?   General: Skin is warm and dry.  ?   Findings: No rash.  ?Neurological:  ?   Mental Status: She is alert and oriented to person, place, and time.  ?   Coordination: Coordination normal.  ?Psychiatric:     ?   Behavior: Behavior normal.  ? ? ? ? ?Assessment & Plan:  ? ?Problem List Items Addressed This Visit   ? ?  ? Respiratory  ? Asthma  ? Relevant Medications  ? albuterol (VENTOLIN  HFA) 108 (90 Base) MCG/ACT inhaler  ? fluticasone-salmeterol (ADVAIR HFA) 115-21 MCG/ACT inhaler  ?  ? Digestive  ? GERD (gastroesophageal reflux disease)  ? Relevant Medications  ? famotidine (PEPCID) 20 MG tablet  ? omeprazole (PRILOSEC) 20 MG capsule  ?  ? Other  ? Anxiety - Primary  ? Relevant Medications  ? escitalopram (LEXAPRO) 5 MG tablet  ? LORazepam (ATIVAN) 1 MG tablet  ? Other Relevant Orders  ? Drug Screen 10 W/Conf, Se  ? HLD (hyperlipidemia)  ? Relevant Medications  ? rosuvastatin (CRESTOR) 5 MG tablet  ?  Other Relevant Orders  ? CBC with Differential/Platelet  ? CMP14+EGFR  ? Lipid panel  ? ?Other Visit Diagnoses   ? ? Need for shingles vaccine      ? Relevant Orders  ? Varicella-zoster vaccine IM (Shingrix) (Completed)  ? Controlled substance agreement signed      ? ?  ?Continue current medicine, sent refills for her. ? ?Seems to be doing well, will do drug screen today ? ?Follow up plan: ?Return in about 3 months (around 01/29/2022), or if symptoms worsen or fail to improve, for Anxiety and hyperlipidemia recheck. ? ?Counseling provided for all of the vaccine components ?Orders Placed This Encounter  ?Procedures  ? Varicella-zoster vaccine IM (Shingrix)  ? CBC with Differential/Platelet  ? CMP14+EGFR  ? Lipid panel  ? Drug Screen 10 W/Conf, Se  ? ? ?Caryl Pina, MD ?[facility] ?10/30/2021, 3:36 PM ? ? ? ? ?

## 2021-11-02 LAB — CMP14+EGFR
ALT: 20 IU/L (ref 0–32)
AST: 30 IU/L (ref 0–40)
Albumin/Globulin Ratio: 2 (ref 1.2–2.2)
Albumin: 4.4 g/dL (ref 3.6–4.6)
Alkaline Phosphatase: 58 IU/L (ref 44–121)
BUN/Creatinine Ratio: 10 — ABNORMAL LOW (ref 12–28)
BUN: 9 mg/dL (ref 8–27)
Bilirubin Total: 0.3 mg/dL (ref 0.0–1.2)
CO2: 24 mmol/L (ref 20–29)
Calcium: 9 mg/dL (ref 8.7–10.3)
Chloride: 106 mmol/L (ref 96–106)
Creatinine, Ser: 0.9 mg/dL (ref 0.57–1.00)
Globulin, Total: 2.2 g/dL (ref 1.5–4.5)
Glucose: 82 mg/dL (ref 70–99)
Potassium: 4.1 mmol/L (ref 3.5–5.2)
Sodium: 145 mmol/L — ABNORMAL HIGH (ref 134–144)
Total Protein: 6.6 g/dL (ref 6.0–8.5)
eGFR: 61 mL/min/{1.73_m2} (ref >59)

## 2021-11-02 LAB — CBC WITH DIFFERENTIAL/PLATELET
Basophils Absolute: 0 10*3/uL (ref 0.0–0.2)
Basos: 0 %
EOS (ABSOLUTE): 0.3 10*3/uL (ref 0.0–0.4)
Eos: 3 %
Hematocrit: 37.4 % (ref 34.0–46.6)
Hemoglobin: 13 g/dL (ref 11.1–15.9)
Immature Grans (Abs): 0 10*3/uL (ref 0.0–0.1)
Immature Granulocytes: 0 %
Lymphocytes Absolute: 3.3 10*3/uL — ABNORMAL HIGH (ref 0.7–3.1)
Lymphs: 34 %
MCH: 33.5 pg — ABNORMAL HIGH (ref 26.6–33.0)
MCHC: 34.8 g/dL (ref 31.5–35.7)
MCV: 96 fL (ref 79–97)
Monocytes Absolute: 0.5 10*3/uL (ref 0.1–0.9)
Monocytes: 5 %
Neutrophils Absolute: 5.5 10*3/uL (ref 1.4–7.0)
Neutrophils: 58 %
Platelets: 206 10*3/uL (ref 150–450)
RBC: 3.88 x10E6/uL (ref 3.77–5.28)
RDW: 12.2 % (ref 11.7–15.4)
WBC: 9.5 10*3/uL (ref 3.4–10.8)

## 2021-11-02 LAB — DRUG SCREEN 10 W/CONF, SERUM
Amphetamines, IA: NEGATIVE ng/mL
Barbiturates, IA: NEGATIVE ug/mL
Benzodiazepines, IA: NEGATIVE ng/mL
Cocaine & Metabolite, IA: NEGATIVE ng/mL
Methadone, IA: NEGATIVE ng/mL
Opiates, IA: NEGATIVE ng/mL
Oxycodones, IA: NEGATIVE ng/mL
Phencyclidine, IA: NEGATIVE ng/mL
Propoxyphene, IA: NEGATIVE ng/mL
THC(Marijuana) Metabolite, IA: NEGATIVE ng/mL

## 2021-11-02 LAB — LIPID PANEL
Chol/HDL Ratio: 2.2 ratio (ref 0.0–4.4)
Cholesterol, Total: 164 mg/dL (ref 100–199)
HDL: 73 mg/dL (ref >39)
LDL Chol Calc (NIH): 72 mg/dL (ref 0–99)
Triglycerides: 107 mg/dL (ref 0–149)
VLDL Cholesterol Cal: 19 mg/dL (ref 5–40)

## 2022-01-29 ENCOUNTER — Ambulatory Visit (INDEPENDENT_AMBULATORY_CARE_PROVIDER_SITE_OTHER): Payer: Medicare HMO | Admitting: Family Medicine

## 2022-01-29 ENCOUNTER — Encounter: Payer: Self-pay | Admitting: Family Medicine

## 2022-01-29 ENCOUNTER — Other Ambulatory Visit: Payer: Self-pay

## 2022-01-29 DIAGNOSIS — F419 Anxiety disorder, unspecified: Secondary | ICD-10-CM

## 2022-01-29 MED ORDER — LORAZEPAM 1 MG PO TABS: 1.0000 mg | ORAL_TABLET | Freq: Two times a day (BID) | ORAL | 2 refills | Status: DC | PRN

## 2022-01-29 NOTE — Progress Notes (Signed)
BP 120/74   Pulse 95   Temp (!) 97.3 F (36.3 C)   Ht 5\' 4"  (1.626 m)   Wt 146 lb (66.2 kg)   SpO2 98%   BMI 25.06 kg/m    Subjective:   Patient ID: Mikayla Wilson, female    DOB: Dec 03, 1930, 88 y.o.   MRN: [id number]  HPI: Mikayla Wilson is a 88 y.o. female presenting on 01/29/2022 for Medical Management of Chronic Issues, Anxiety, and Hyperlipidemia   HPI Anxiety recheck Current rx-lorazepam 1 mg twice daily as needed # meds rx-40/month Effectiveness of current meds-works well Adverse reactions form meds-none  Pill count performed-No Last drug screen -10/17/2021 ( high risk q54m, moderate risk q24m, low risk yearly ) Urine drug screen today- No Was the [facility] reviewed-yes  If yes were their any concerning findings? -None  No flowsheet data found.   Controlled substance contract signed on: 10/17/2021  Relevant past medical, surgical, family and social history reviewed and updated as indicated. Interim medical history since our last visit reviewed. Allergies and medications reviewed and updated.  Review of Systems  Constitutional:  Negative for chills and fever.  Eyes:  Negative for visual disturbance.  Respiratory:  Negative for chest tightness and shortness of breath.   Cardiovascular:  Negative for chest pain and leg swelling.  Musculoskeletal:  Negative for back pain and gait problem.  Skin:  Negative for rash.  Neurological:  Negative for light-headedness and headaches.  Psychiatric/Behavioral:  Positive for dysphoric mood and sleep disturbance. Negative for agitation, behavioral problems, self-injury and suicidal ideas. The patient is nervous/anxious.   All other systems reviewed and are negative.   Per HPI unless specifically indicated above   Allergies as of 01/29/2022       Reactions   Aspirin Other (See Comments)   "My doctor told me to never take aspirin"   Morphine And Related Other (See Comments)        Medication List        Accurate as  of January 29, 2022  2:59 PM. If you have any questions, ask your nurse or doctor.          acetaminophen 325 MG tablet Commonly known as: TYLENOL Take 2 tablets (650 mg total) by mouth every 6 (six) hours as needed for mild pain (or Fever >/= 101).   Advair HFA 115-21 MCG/ACT inhaler Generic drug: fluticasone-salmeterol Inhale 2 puffs into the lungs 2 (two) times daily.   albuterol 108 (90 Base) MCG/ACT inhaler Commonly known as: Ventolin HFA Inhale 1-2 puffs into the lungs every 6 (six) hours as needed for wheezing or shortness of breath.   alum & mag hydroxide-simeth 200-200-20 MG/5ML suspension Commonly known as: MAALOX/MYLANTA Take 30 mLs by mouth every 4 (four) hours as needed for indigestion.   ENSURE NUTRITION SHAKE PO Take 1-2 Bottles by mouth daily. Reported on 11/30/2015   escitalopram 5 MG tablet Commonly known as: LEXAPRO Take 1 tablet (5 mg total) by mouth daily.   famotidine 20 MG tablet Commonly known as: PEPCID Take 1 tablet (20 mg total) by mouth 2 (two) times daily.   fluticasone 50 MCG/ACT nasal spray Commonly known as: FLONASE Use 2 spray(s) in each nostril once daily   ketotifen 0.025 % ophthalmic solution Commonly known as: ZADITOR Place 1 drop into both eyes 6 (six) times daily.   loratadine 10 MG tablet Commonly known as: EQ Loratadine Take 1 tablet (10 mg total) by mouth daily. TAKE 1 TABLET BY MOUTH  ONCE DAILY AS NEEDED FOR ALLERGIES   LORazepam 1 MG tablet Commonly known as: ATIVAN Take 1 tablet (1 mg total) by mouth 2 (two) times daily as needed for anxiety.   omeprazole 20 MG capsule Commonly known as: PRILOSEC Take 1 capsule (20 mg total) by mouth daily.   rosuvastatin 5 MG tablet Commonly known as: CRESTOR Take 1 tablet (5 mg total) by mouth daily.   SALINE NASAL MIST NA Place 1 spray into the nose 2 (two) times daily.   VITAMIN D PO Take 1 tablet by mouth daily.         Objective:   BP 120/74   Pulse 95   Temp (!)  97.3 F (36.3 C)   Ht 5\' 4"  (1.626 m)   Wt 146 lb (66.2 kg)   SpO2 98%   BMI 25.06 kg/m   Wt Readings from Last 3 Encounters:  01/29/22 146 lb (66.2 kg)  10/30/21 147 lb (66.7 kg)  07/31/21 141 lb (64 kg)    Physical Exam Vitals and nursing note reviewed.  Constitutional:      General: She is not in acute distress.    Appearance: She is well-developed. She is not diaphoretic.  Eyes:     Conjunctiva/sclera: Conjunctivae normal.  Cardiovascular:     Rate and Rhythm: Normal rate and regular rhythm.     Heart sounds: Normal heart sounds. No murmur heard. Pulmonary:     Effort: Pulmonary effort is normal. No respiratory distress.     Breath sounds: Normal breath sounds. No wheezing.  Musculoskeletal:        General: No swelling or tenderness.  Skin:    General: Skin is warm and dry.     Findings: No rash.  Neurological:     Mental Status: She is alert and oriented to person, place, and time.     Coordination: Coordination normal.  Psychiatric:        Behavior: Behavior normal.       Assessment & Plan:   Problem List Items Addressed This Visit       Other   Anxiety   Relevant Medications   LORazepam (ATIVAN) 1 MG tablet  Continue lorazepam and escitalopram.  Follow up plan: Return in about 3 months (around 05/01/2022), or if symptoms worsen or fail to improve, for anxiety and hld.  Counseling provided for all of the vaccine components No orders of the defined types were placed in this encounter.   05/03/2022, MD [facility] 01/29/2022, 2:59 PM

## 2022-05-01 ENCOUNTER — Encounter: Payer: Self-pay | Admitting: Family Medicine

## 2022-05-01 ENCOUNTER — Ambulatory Visit (INDEPENDENT_AMBULATORY_CARE_PROVIDER_SITE_OTHER): Payer: Medicare HMO | Admitting: Family Medicine

## 2022-05-01 VITALS — BP 107/68 | HR 95 | Temp 97.1°F | Ht 64.0 in | Wt 145.0 lb

## 2022-05-01 DIAGNOSIS — F419 Anxiety disorder, unspecified: Secondary | ICD-10-CM | POA: Diagnosis not present

## 2022-05-01 DIAGNOSIS — K219 Gastro-esophageal reflux disease without esophagitis: Secondary | ICD-10-CM

## 2022-05-01 DIAGNOSIS — E782 Mixed hyperlipidemia: Secondary | ICD-10-CM | POA: Diagnosis not present

## 2022-05-01 DIAGNOSIS — Z23 Encounter for immunization: Secondary | ICD-10-CM | POA: Diagnosis not present

## 2022-05-01 MED ORDER — LORAZEPAM 1 MG PO TABS: 1.0000 mg | ORAL_TABLET | Freq: Two times a day (BID) | ORAL | 2 refills | Status: DC | PRN

## 2022-05-01 NOTE — Progress Notes (Signed)
BP 107/68   Pulse 95   Temp (!) 97.1 F (36.2 C)   Ht 5\' 4"  (1.626 m)   Wt 145 lb (65.8 kg)   SpO2 96%   BMI 24.89 kg/m    Subjective:   Patient ID: Mikayla Wilson, female    DOB: 1930-09-15, 88 y.o.   MRN: [id number]  HPI: Mikayla Wilson is a 88 y.o. female presenting on 05/01/2022 for Medical Management of Chronic Issues, Anxiety, and Hyperlipidemia   HPI Anxiety recheck Current rx-lorazepam 1 mg twice daily as needed # meds rx-40/month Effectiveness of current meds-works well Adverse reactions form meds-none  Pill count performed-No Last drug screen -11/06/2021 ( high risk q60m, moderate risk q43m, low risk yearly ) Urine drug screen today- No Was the [place] reviewed-yes  If yes were their any concerning findings? -None  No flowsheet data found.   Controlled substance contract signed on: 11/06/2021  Hyperlipidemia Patient is coming in for recheck of his hyperlipidemia. The patient is currently taking Crestor. They deny any issues with myalgias or history of liver damage from it. They deny any focal numbness or weakness or chest pain.   GERD Patient is currently on omeprazole.  She denies any major symptoms or abdominal pain or belching or burping. She denies any blood in her stool or lightheadedness or dizziness.   Relevant past medical, surgical, family and social history reviewed and updated as indicated. Interim medical history since our last visit reviewed. Allergies and medications reviewed and updated.  Review of Systems  Constitutional:  Negative for chills and fever.  Eyes:  Negative for redness and visual disturbance.  Respiratory:  Negative for chest tightness and shortness of breath.   Cardiovascular:  Negative for chest pain and leg swelling.  Genitourinary:  Negative for difficulty urinating and dysuria.  Musculoskeletal:  Positive for arthralgias. Negative for back pain and gait problem.  Skin:  Negative for rash.  Neurological:  Negative for  light-headedness and headaches.  Psychiatric/Behavioral:  Positive for dysphoric mood and sleep disturbance. Negative for agitation, behavioral problems, self-injury and suicidal ideas. The patient is nervous/anxious.   All other systems reviewed and are negative.   Per HPI unless specifically indicated above   Allergies as of 05/01/2022       Reactions   Aspirin Other (See Comments)   "My doctor told me to never take aspirin"   Morphine And Related Other (See Comments)        Medication List        Accurate as of May 01, 2022 11:21 AM. If you have any questions, ask your nurse or doctor.          acetaminophen 325 MG tablet Commonly known as: TYLENOL Take 2 tablets (650 mg total) by mouth every 6 (six) hours as needed for mild pain (or Fever >/= 101).   Advair HFA 115-21 MCG/ACT inhaler Generic drug: fluticasone-salmeterol Inhale 2 puffs into the lungs 2 (two) times daily.   albuterol 108 (90 Base) MCG/ACT inhaler Commonly known as: Ventolin HFA Inhale 1-2 puffs into the lungs every 6 (six) hours as needed for wheezing or shortness of breath.   alum & mag hydroxide-simeth 200-200-20 MG/5ML suspension Commonly known as: MAALOX/MYLANTA Take 30 mLs by mouth every 4 (four) hours as needed for indigestion.   ENSURE NUTRITION SHAKE PO Take 1-2 Bottles by mouth daily. Reported on 11/30/2015   escitalopram 5 MG tablet Commonly known as: LEXAPRO Take 1 tablet (5 mg total) by mouth daily.  famotidine 20 MG tablet Commonly known as: PEPCID Take 1 tablet (20 mg total) by mouth 2 (two) times daily.   fluticasone 50 MCG/ACT nasal spray Commonly known as: FLONASE Use 2 spray(s) in each nostril once daily   ketotifen 0.025 % ophthalmic solution Commonly known as: ZADITOR Place 1 drop into both eyes 6 (six) times daily.   loratadine 10 MG tablet Commonly known as: EQ Loratadine Take 1 tablet (10 mg total) by mouth daily. TAKE 1 TABLET BY MOUTH ONCE DAILY AS NEEDED  FOR ALLERGIES   LORazepam 1 MG tablet Commonly known as: ATIVAN Take 1 tablet (1 mg total) by mouth 2 (two) times daily as needed for anxiety.   omeprazole 20 MG capsule Commonly known as: PRILOSEC Take 1 capsule (20 mg total) by mouth daily.   rosuvastatin 5 MG tablet Commonly known as: CRESTOR Take 1 tablet (5 mg total) by mouth daily.   SALINE NASAL MIST NA Place 1 spray into the nose 2 (two) times daily.   VITAMIN D PO Take 1 tablet by mouth daily.         Objective:   BP 107/68   Pulse 95   Temp (!) 97.1 F (36.2 C)   Ht _0  (1.626 m)   Wt 145 lb (65.8 kg)   SpO2 96%   BMI 24.89 kg/m   Wt Readings from Last 3 Encounters:  05/01/22 145 lb (65.8 kg)  01/29/22 146 lb (66.2 kg)  10/30/21 147 lb (66.7 kg)    Physical Exam Vitals and nursing note reviewed.  Constitutional:      General: She is not in acute distress.    Appearance: She is well-developed. She is not diaphoretic.  Eyes:     Conjunctiva/sclera: Conjunctivae normal.  Cardiovascular:     Rate and Rhythm: Normal rate and regular rhythm.     Heart sounds: Normal heart sounds. No murmur heard. Pulmonary:     Effort: Pulmonary effort is normal. No respiratory distress.     Breath sounds: Normal breath sounds. No wheezing.  Musculoskeletal:        General: Swelling (2+ bilateral lower extremity edema) present. No tenderness. Normal range of motion.  Skin:    General: Skin is warm and dry.     Findings: No rash.  Neurological:     Mental Status: She is alert and oriented to person, place, and time.     Coordination: Coordination normal.  Psychiatric:        Behavior: Behavior normal.       Assessment & Plan:   Problem List Items Addressed This Visit       Digestive   GERD (gastroesophageal reflux disease)   Relevant Orders   CMP14+EGFR   Lipid panel     Other   Anxiety   Relevant Medications   LORazepam (ATIVAN) 1 MG tablet   HLD (hyperlipidemia) - Primary   Relevant Orders    CMP14+EGFR   Lipid panel    Blood pressure looks good everything else looks good, continue current medicine, no changes. Follow up plan: Return in about 3 months (around 08/01/2022), or if symptoms worsen or fail to improve, for Anxiety recheck.  Counseling provided for all of the vaccine components Orders Placed This Encounter  Procedures   CMP14+EGFR   Lipid panel    Caryl Pina, MD [facility] 05/01/2022, 11:21 AM

## 2022-05-02 LAB — CMP14+EGFR
ALT: 11 IU/L (ref 0–32)
AST: 24 IU/L (ref 0–40)
Albumin/Globulin Ratio: 2.1 (ref 1.2–2.2)
Albumin: 4.2 g/dL (ref 3.7–4.7)
Alkaline Phosphatase: 61 IU/L (ref 44–121)
BUN/Creatinine Ratio: 12 (ref 12–28)
BUN: 10 mg/dL (ref 8–27)
Bilirubin Total: 0.4 mg/dL (ref 0.0–1.2)
CO2: 23 mmol/L (ref 20–29)
Calcium: 9.2 mg/dL (ref 8.7–10.3)
Chloride: 106 mmol/L (ref 96–106)
Creatinine, Ser: 0.86 mg/dL (ref 0.57–1.00)
Globulin, Total: 2 g/dL (ref 1.5–4.5)
Glucose: 87 mg/dL (ref 70–99)
Potassium: 3.9 mmol/L (ref 3.5–5.2)
Sodium: 143 mmol/L (ref 134–144)
Total Protein: 6.2 g/dL (ref 6.0–8.5)
eGFR: 65 mL/min/{1.73_m2} (ref >59)

## 2022-05-02 LAB — LIPID PANEL
Chol/HDL Ratio: 2.5 ratio (ref 0.0–4.4)
Cholesterol, Total: 160 mg/dL (ref 100–199)
HDL: 65 mg/dL (ref >39)
LDL Chol Calc (NIH): 73 mg/dL (ref 0–99)
Triglycerides: 129 mg/dL (ref 0–149)
VLDL Cholesterol Cal: 22 mg/dL (ref 5–40)

## 2022-08-01 ENCOUNTER — Encounter: Payer: Self-pay | Admitting: Family Medicine

## 2022-08-01 ENCOUNTER — Ambulatory Visit (INDEPENDENT_AMBULATORY_CARE_PROVIDER_SITE_OTHER): Payer: Medicare HMO | Admitting: Family Medicine

## 2022-08-01 VITALS — BP 125/70 | HR 85 | Temp 97.4°F | Ht 64.0 in | Wt 143.0 lb

## 2022-08-01 DIAGNOSIS — K219 Gastro-esophageal reflux disease without esophagitis: Secondary | ICD-10-CM | POA: Diagnosis not present

## 2022-08-01 DIAGNOSIS — E782 Mixed hyperlipidemia: Secondary | ICD-10-CM | POA: Diagnosis not present

## 2022-08-01 DIAGNOSIS — F419 Anxiety disorder, unspecified: Secondary | ICD-10-CM

## 2022-08-01 MED ORDER — LORATADINE 10 MG PO TABS: 10.0000 mg | ORAL_TABLET | Freq: Every day | ORAL | 3 refills | Status: DC

## 2022-08-01 MED ORDER — ESCITALOPRAM OXALATE 5 MG PO TABS: 5.0000 mg | ORAL_TABLET | Freq: Every day | ORAL | 3 refills | Status: DC

## 2022-08-01 MED ORDER — OMEPRAZOLE 20 MG PO CPDR: 20.0000 mg | DELAYED_RELEASE_CAPSULE | Freq: Every day | ORAL | 3 refills | Status: DC

## 2022-08-01 MED ORDER — ROSUVASTATIN CALCIUM 5 MG PO TABS: 5.0000 mg | ORAL_TABLET | Freq: Every day | ORAL | 3 refills | Status: DC

## 2022-08-01 MED ORDER — LORAZEPAM 1 MG PO TABS: 1.0000 mg | ORAL_TABLET | Freq: Two times a day (BID) | ORAL | 2 refills | Status: DC | PRN

## 2022-08-01 MED ORDER — FAMOTIDINE 20 MG PO TABS: 20.0000 mg | ORAL_TABLET | Freq: Two times a day (BID) | ORAL | 3 refills | Status: DC

## 2022-08-01 NOTE — Progress Notes (Signed)
BP 125/70   Pulse 85   Temp (!) 97.4 F (36.3 C)   Ht 5\' 4"  (1.626 m)   Wt 143 lb (64.9 kg)   SpO2 100%   BMI 24.55 kg/m    Subjective:   Patient ID: Mikayla Wilson, female    DOB: 04/29/1931, 89 y.o.   MRN: [id number]  HPI: Mikayla Wilson is a 89 y.o. female presenting on 08/01/2022 for Medical Management of Chronic Issues, Anxiety, and Hyperlipidemia   HPI Anxiety recheck Current rx-Lexapro and lorazepam 1 mg twice daily. # meds rx-40/month Effectiveness of current meds-works well Adverse reactions form meds-denies  Pill count performed-No Last drug screen -11/06/2021 ( high risk q8m, moderate risk q59m, low risk yearly ) Urine drug screen today- No Was the [facility] reviewed-yes  If yes were their any concerning findings? -None  No flowsheet data found.   Controlled substance contract signed on: 11/06/2021  Hyperlipidemia Patient is coming in for recheck of his hyperlipidemia. The patient is currently taking Crestor. They deny any issues with myalgias or history of liver damage from it. They deny any focal numbness or weakness or chest pain.   GERD Patient is currently on omeprazole.  She denies any major symptoms or abdominal pain or belching or burping. She denies any blood in her stool or lightheadedness or dizziness.   Relevant past medical, surgical, family and social history reviewed and updated as indicated. Interim medical history since our last visit reviewed. Allergies and medications reviewed and updated.  Review of Systems  Constitutional:  Negative for chills and fever.  Eyes:  Negative for visual disturbance.  Respiratory:  Negative for chest tightness and shortness of breath.   Cardiovascular:  Negative for chest pain and leg swelling.  Musculoskeletal:  Negative for back pain and gait problem.  Skin:  Negative for rash.  Neurological:  Negative for dizziness, light-headedness and headaches.  Psychiatric/Behavioral:  Negative for agitation and  behavioral problems.   All other systems reviewed and are negative.   Per HPI unless specifically indicated above   Allergies as of 08/01/2022       Reactions   Aspirin Other (See Comments)   "My doctor told me to never take aspirin"   Morphine And Related Other (See Comments)        Medication List        Accurate as of August 01, 2022 11:11 AM. If you have any questions, ask your nurse or doctor.          acetaminophen 325 MG tablet Commonly known as: TYLENOL Take 2 tablets (650 mg total) by mouth every 6 (six) hours as needed for mild pain (or Fever >/= 101).   Advair HFA 115-21 MCG/ACT inhaler Generic drug: fluticasone-salmeterol Inhale 2 puffs into the lungs 2 (two) times daily.   albuterol 108 (90 Base) MCG/ACT inhaler Commonly known as: Ventolin HFA Inhale 1-2 puffs into the lungs every 6 (six) hours as needed for wheezing or shortness of breath.   alum & mag hydroxide-simeth 200-200-20 MG/5ML suspension Commonly known as: MAALOX/MYLANTA Take 30 mLs by mouth every 4 (four) hours as needed for indigestion.   ENSURE NUTRITION SHAKE PO Take 1-2 Bottles by mouth daily. Reported on 11/30/2015   escitalopram 5 MG tablet Commonly known as: LEXAPRO Take 1 tablet (5 mg total) by mouth daily.   famotidine 20 MG tablet Commonly known as: PEPCID Take 1 tablet (20 mg total) by mouth 2 (two) times daily.   fluticasone 50 MCG/ACT nasal  spray Commonly known as: FLONASE Use 2 spray(s) in each nostril once daily   ketotifen 0.025 % ophthalmic solution Commonly known as: ZADITOR Place 1 drop into both eyes 6 (six) times daily.   loratadine 10 MG tablet Commonly known as: EQ Loratadine Take 1 tablet (10 mg total) by mouth daily. TAKE 1 TABLET BY MOUTH ONCE DAILY AS NEEDED FOR ALLERGIES   LORazepam 1 MG tablet Commonly known as: ATIVAN Take 1 tablet (1 mg total) by mouth 2 (two) times daily as needed for anxiety.   omeprazole 20 MG capsule Commonly known as:  PRILOSEC Take 1 capsule (20 mg total) by mouth daily.   rosuvastatin 5 MG tablet Commonly known as: CRESTOR Take 1 tablet (5 mg total) by mouth daily.   SALINE NASAL MIST NA Place 1 spray into the nose 2 (two) times daily.   VITAMIN D PO Take 1 tablet by mouth daily.         Objective:   BP 125/70   Pulse 85   Temp (!) 97.4 F (36.3 C)   Ht 5\' 4"  (1.626 m)   Wt 143 lb (64.9 kg)   SpO2 100%   BMI 24.55 kg/m   Wt Readings from Last 3 Encounters:  08/01/22 143 lb (64.9 kg)  05/01/22 145 lb (65.8 kg)  01/29/22 146 lb (66.2 kg)    Physical Exam Vitals and nursing note reviewed.  Constitutional:      General: She is not in acute distress.    Appearance: She is well-developed. She is not diaphoretic.  Eyes:     Conjunctiva/sclera: Conjunctivae normal.  Cardiovascular:     Rate and Rhythm: Normal rate and regular rhythm.     Heart sounds: Normal heart sounds. No murmur heard. Pulmonary:     Effort: Pulmonary effort is normal. No respiratory distress.     Breath sounds: Normal breath sounds. No wheezing.  Musculoskeletal:        General: No swelling or tenderness. Normal range of motion.  Skin:    General: Skin is warm and dry.     Findings: No rash.  Neurological:     Mental Status: She is alert and oriented to person, place, and time.     Coordination: Coordination normal.  Psychiatric:        Behavior: Behavior normal.      Assessment & Plan:   Problem List Items Addressed This Visit       Digestive   GERD (gastroesophageal reflux disease)   Relevant Medications   omeprazole (PRILOSEC) 20 MG capsule   famotidine (PEPCID) 20 MG tablet     Other   Anxiety - Primary   Relevant Medications   LORazepam (ATIVAN) 1 MG tablet   escitalopram (LEXAPRO) 5 MG tablet   HLD (hyperlipidemia)   Relevant Medications   rosuvastatin (CRESTOR) 5 MG tablet    Continue current medicine, no changes. Follow up plan: Return in about 3 months (around 10/31/2022), or  if symptoms worsen or fail to improve, for Hyperlipidemia and anxiety recheck.  Counseling provided for all of the vaccine components No orders of the defined types were placed in this encounter.   Caryl Pina, MD [facility] 08/01/2022, 11:11 AM

## 2022-08-12 ENCOUNTER — Telehealth: Payer: Self-pay

## 2022-08-12 NOTE — Telephone Encounter (Signed)
[employer] will cover:  Symbicort HFA  Breo Ellipta Powder  Advair HDA Aerosol  Wixela Inub Powder  Fluticasone Prop-Salmeterol Breath activated Powder

## 2022-08-13 ENCOUNTER — Other Ambulatory Visit: Payer: Self-pay | Admitting: Family Medicine

## 2022-08-13 DIAGNOSIS — J4520 Mild intermittent asthma, uncomplicated: Secondary | ICD-10-CM

## 2022-08-13 MED ORDER — FLUTICASONE-SALMETEROL 100-50 MCG/ACT IN AEPB: 1.0000 | INHALATION_SPRAY | Freq: Two times a day (BID) | RESPIRATORY_TRACT | 11 refills | Status: DC

## 2022-08-13 MED ORDER — FLUTICASONE-SALMETEROL(SENSOR) 113-14 MCG/ACT IN AEPB: 1.0000 | INHALATION_SPRAY | Freq: Two times a day (BID) | RESPIRATORY_TRACT | 11 refills | Status: DC

## 2022-08-13 NOTE — Progress Notes (Signed)
lmtcb

## 2022-08-13 NOTE — Progress Notes (Signed)
Said Advair would not be covered but fluticasone-some Adderall would, sent to [facility], let me know if neither of them is covered.

## 2022-08-19 NOTE — Progress Notes (Signed)
Ok to leave detailed message on home phone. Made pt and daughter aware of new inhaler and to call back if needed.

## 2022-10-31 ENCOUNTER — Ambulatory Visit (INDEPENDENT_AMBULATORY_CARE_PROVIDER_SITE_OTHER): Payer: Medicare HMO | Admitting: Family Medicine

## 2022-10-31 ENCOUNTER — Encounter: Payer: Self-pay | Admitting: Family Medicine

## 2022-10-31 VITALS — BP 121/71 | HR 95 | Ht 64.0 in | Wt 137.0 lb

## 2022-10-31 DIAGNOSIS — E782 Mixed hyperlipidemia: Secondary | ICD-10-CM | POA: Diagnosis not present

## 2022-10-31 DIAGNOSIS — F419 Anxiety disorder, unspecified: Secondary | ICD-10-CM | POA: Diagnosis not present

## 2022-10-31 DIAGNOSIS — K219 Gastro-esophageal reflux disease without esophagitis: Secondary | ICD-10-CM | POA: Diagnosis not present

## 2022-10-31 MED ORDER — LORAZEPAM 1 MG PO TABS: 1.0000 mg | ORAL_TABLET | Freq: Two times a day (BID) | ORAL | 2 refills | Status: DC | PRN

## 2022-10-31 NOTE — Progress Notes (Signed)
BP 121/71   Pulse 95   Ht 5\' 4"  (1.626 m)   Wt 137 lb (62.1 kg)   SpO2 93%   BMI 23.52 kg/m    Subjective:   Patient ID: Mikayla Wilson, female    DOB: May 23, 1931, 89 y.o.   MRN: [id number]  HPI: Mikayla Wilson is a 89 y.o. female presenting on 10/31/2022 for Medical Management of Chronic Issues, Anxiety, and Hyperlipidemia   HPI Hyperlipidemia Patient is coming in for recheck of his hyperlipidemia. The patient is currently taking Crestor. They deny any issues with myalgias or history of liver damage from it. They deny any focal numbness or weakness or chest pain.   GERD Patient is currently on omeprazole.  She denies any major symptoms or abdominal pain or belching or burping. She denies any blood in her stool or lightheadedness or dizziness.   Anxiety Current rx-lorazepam 1 mg twice daily as needed # meds rx-40/month Effectiveness of current meds-works well, had tapered slowly Adverse reactions form meds-none  Pill count performed-No Last drug screen -10/17/2021 ( high risk q40m, moderate risk q52m, low risk yearly ) Urine drug screen today- Yes Was the [place] reviewed-yes  If yes were their any concerning findings? -None  No flowsheet data found.   Controlled substance contract signed on: Today  Relevant past medical, surgical, family and social history reviewed and updated as indicated. Interim medical history since our last visit reviewed. Allergies and medications reviewed and updated.  Review of Systems  Constitutional:  Negative for chills and fever.  Eyes:  Negative for visual disturbance.  Respiratory:  Negative for chest tightness and shortness of breath.   Cardiovascular:  Positive for leg swelling. Negative for chest pain.  Musculoskeletal:  Negative for back pain and gait problem.  Skin:  Negative for rash.  Neurological:  Negative for light-headedness and headaches.  Psychiatric/Behavioral:  Negative for agitation and behavioral problems.   All  other systems reviewed and are negative.   Per HPI unless specifically indicated above   Allergies as of 10/31/2022       Reactions   Aspirin Other (See Comments)   "My doctor told me to never take aspirin"   Morphine And Related Other (See Comments)        Medication List        Accurate as of October 31, 2022 11:23 AM. If you have any questions, ask your nurse or doctor.          acetaminophen 325 MG tablet Commonly known as: TYLENOL Take 2 tablets (650 mg total) by mouth every 6 (six) hours as needed for mild pain (or Fever >/= 101).   Advair HFA 115-21 MCG/ACT inhaler Generic drug: fluticasone-salmeterol Inhale 2 puffs into the lungs 2 (two) times daily.   fluticasone-salmeterol 100-50 MCG/ACT Aepb Commonly known as: ADVAIR Inhale 1 puff into the lungs 2 (two) times daily.   albuterol 108 (90 Base) MCG/ACT inhaler Commonly known as: Ventolin HFA Inhale 1-2 puffs into the lungs every 6 (six) hours as needed for wheezing or shortness of breath.   alum & mag hydroxide-simeth 200-200-20 MG/5ML suspension Commonly known as: MAALOX/MYLANTA Take 30 mLs by mouth every 4 (four) hours as needed for indigestion.   ENSURE NUTRITION SHAKE PO Take 1-2 Bottles by mouth daily. Reported on 11/30/2015   escitalopram 5 MG tablet Commonly known as: LEXAPRO Take 1 tablet (5 mg total) by mouth daily.   famotidine 20 MG tablet Commonly known as: PEPCID Take 1 tablet (20  mg total) by mouth 2 (two) times daily.   fluticasone 50 MCG/ACT nasal spray Commonly known as: FLONASE Use 2 spray(s) in each nostril once daily   Fluticasone-Salmeterol(sensor) 113-14 MCG/ACT Aepb Inhale 1 puff into the lungs 2 (two) times daily.   ketotifen 0.025 % ophthalmic solution Commonly known as: ZADITOR Place 1 drop into both eyes 6 (six) times daily.   loratadine 10 MG tablet Commonly known as: EQ Loratadine Take 1 tablet (10 mg total) by mouth daily. TAKE 1 TABLET BY MOUTH ONCE DAILY AS  NEEDED FOR ALLERGIES   LORazepam 1 MG tablet Commonly known as: ATIVAN Take 1 tablet (1 mg total) by mouth 2 (two) times daily as needed for anxiety.   omeprazole 20 MG capsule Commonly known as: PRILOSEC Take 1 capsule (20 mg total) by mouth daily.   rosuvastatin 5 MG tablet Commonly known as: CRESTOR Take 1 tablet (5 mg total) by mouth daily.   SALINE NASAL MIST NA Place 1 spray into the nose 2 (two) times daily.   VITAMIN D PO Take 1 tablet by mouth daily.         Objective:   BP 121/71   Pulse 95   Ht  (1.626 m)   Wt 137 lb (62.1 kg)   SpO2 93%   BMI 23.52 kg/m   Wt Readings from Last 3 Encounters:  10/31/22 137 lb (62.1 kg)  08/01/22 143 lb (64.9 kg)  05/01/22 145 lb (65.8 kg)    Physical Exam Vitals and nursing note reviewed.  Constitutional:      General: She is not in acute distress.    Appearance: She is well-developed. She is not diaphoretic.  Eyes:     Conjunctiva/sclera: Conjunctivae normal.     Pupils: Pupils are equal, round, and reactive to light.  Cardiovascular:     Rate and Rhythm: Normal rate and regular rhythm.     Heart sounds: Normal heart sounds. No murmur heard. Pulmonary:     Effort: Pulmonary effort is normal. No respiratory distress.     Breath sounds: Normal breath sounds. No wheezing.  Musculoskeletal:        General: Swelling (2+ bilateral lower extremity edema) present. No tenderness. Normal range of motion.  Skin:    General: Skin is warm and dry.     Findings: No rash.  Neurological:     Mental Status: She is alert and oriented to person, place, and time.     Coordination: Coordination normal.  Psychiatric:        Behavior: Behavior normal.       Assessment & Plan:   Problem List Items Addressed This Visit       Digestive   GERD (gastroesophageal reflux disease)   Relevant Orders   CBC with Differential/Platelet     Other   Anxiety   Relevant Medications   LORazepam (ATIVAN) 1 MG tablet   Other  Relevant Orders   ToxASSURE Select 13 (MW), Urine   HLD (hyperlipidemia) - Primary   Relevant Orders   CBC with Differential/Platelet   CMP14+EGFR   Lipid panel    Patient always has a little bit of swelling on and off, doing okay for her.  Blood pressure heart rate everything looks good today, sent medication refills for her and we will see her back for her usual in 3 months. Follow up plan: Return in about 3 months (around 01/30/2023), or if symptoms worsen or fail to improve, for Hyperlipidemia and anxiety and GERD.  Counseling provided for all of the vaccine components Orders Placed This Encounter  Procedures   ToxASSURE Select 13 (MW), Urine   CBC with Differential/Platelet   CMP14+EGFR   Lipid panel    Arville Care, MD [facility] 10/31/2022, 11:23 AM

## 2022-11-06 LAB — TOXASSURE SELECT 13 (MW), URINE

## 2023-02-04 ENCOUNTER — Telehealth: Payer: Self-pay

## 2023-02-04 DIAGNOSIS — F419 Anxiety disorder, unspecified: Secondary | ICD-10-CM

## 2023-02-04 NOTE — Telephone Encounter (Signed)
Pt will have to r/s her OV today because she is unable to complete the appt virtually. Pt takes a controlled medication and will need a refill until the appt can be r/s. Will send to [facility] for review.   Lorazepam 1mg    Pts contract is UTD.

## 2023-02-05 ENCOUNTER — Ambulatory Visit: Payer: Medicare HMO | Admitting: Family Medicine

## 2023-02-05 MED ORDER — LORAZEPAM 1 MG PO TABS: 1.0000 mg | ORAL_TABLET | Freq: Two times a day (BID) | ORAL | 0 refills | Status: DC | PRN

## 2023-02-05 NOTE — Telephone Encounter (Signed)
Sent 1 month lorazepam for the patient

## 2023-02-05 NOTE — Addendum Note (Signed)
Addended by: Arville Care on: 02/05/2023 03:54 PM   Modules accepted: Orders

## 2023-03-09 ENCOUNTER — Encounter: Payer: Self-pay | Admitting: Family Medicine

## 2023-03-09 ENCOUNTER — Ambulatory Visit: Payer: Medicare HMO | Admitting: Family Medicine

## 2023-03-09 VITALS — BP 109/63 | HR 89 | Ht 64.0 in | Wt 128.0 lb

## 2023-03-09 DIAGNOSIS — F419 Anxiety disorder, unspecified: Secondary | ICD-10-CM | POA: Diagnosis not present

## 2023-03-09 DIAGNOSIS — E782 Mixed hyperlipidemia: Secondary | ICD-10-CM | POA: Diagnosis not present

## 2023-03-09 DIAGNOSIS — K219 Gastro-esophageal reflux disease without esophagitis: Secondary | ICD-10-CM

## 2023-03-09 MED ORDER — LORAZEPAM 1 MG PO TABS: 1.0000 mg | ORAL_TABLET | Freq: Two times a day (BID) | ORAL | 2 refills | Status: DC | PRN

## 2023-03-09 NOTE — Progress Notes (Signed)
BP 109/63   Pulse 89   Ht 5\' 4"  (1.626 m)   Wt 128 lb (58.1 kg)   SpO2 96%   BMI 21.97 kg/m    Subjective:   Patient ID: Mikayla Wilson, female    DOB: 06/29/1931, 89 y.o.   MRN: [id number]  HPI: Mikayla Wilson is a 89 y.o. female presenting on 03/09/2023 for Medical Management of Chronic Issues, Hyperlipidemia, and Anxiety   HPI Hyperlipidemia Patient is coming in for recheck of his hyperlipidemia. The patient is currently taking Crestor. They deny any issues with myalgias or history of liver damage from it. They deny any focal numbness or weakness or chest pain.   GERD Patient is currently on omeprazole.  She denies any major symptoms or abdominal pain or belching or burping. She denies any blood in her stool or lightheadedness or dizziness.   Anxiety recheck Current rx-Lexapro and lorazepam 1 mg twice daily as needed # meds rx-40/month, we have reduced her down Effectiveness of current meds-looks well Adverse reactions form meds-has had a little off balance and falls, we are working on reducing the benzodiazepine slowly Pill count performed-No Last drug screen -5/2 -24 ( high risk q66m, moderate risk q34m, low risk yearly ) Urine drug screen today- No Was the NCCSR reviewed-yes  If yes were their any concerning findings? -None Controlled substance contract signed on: 11/13/2022  Relevant past medical, surgical, family and social history reviewed and updated as indicated. Interim medical history since our last visit reviewed. Allergies and medications reviewed and updated.  Review of Systems  Constitutional:  Negative for chills and fever.  Eyes:  Negative for redness and visual disturbance.  Respiratory:  Negative for chest tightness and shortness of breath.   Cardiovascular:  Negative for chest pain and leg swelling.  Musculoskeletal:  Negative for back pain and gait problem.  Skin:  Negative for rash.  Neurological:  Negative for light-headedness and headaches.   Psychiatric/Behavioral:  Negative for agitation and behavioral problems.   All other systems reviewed and are negative.   Per HPI unless specifically indicated above   Allergies as of 03/09/2023       Reactions   Aspirin Other (See Comments)   "My doctor told me to never take aspirin"   Morphine And Codeine Other (See Comments)        Medication List        Accurate as of March 09, 2023 12:04 PM. If you have any questions, ask your nurse or doctor.          acetaminophen 325 MG tablet Commonly known as: TYLENOL Take 2 tablets (650 mg total) by mouth every 6 (six) hours as needed for mild pain (or Fever >/= 101).   Advair HFA 115-21 MCG/ACT inhaler Generic drug: fluticasone-salmeterol Inhale 2 puffs into the lungs 2 (two) times daily.   fluticasone-salmeterol 100-50 MCG/ACT Aepb Commonly known as: ADVAIR Inhale 1 puff into the lungs 2 (two) times daily.   albuterol 108 (90 Base) MCG/ACT inhaler Commonly known as: Ventolin HFA Inhale 1-2 puffs into the lungs every 6 (six) hours as needed for wheezing or shortness of breath.   alum & mag hydroxide-simeth 200-200-20 MG/5ML suspension Commonly known as: MAALOX/MYLANTA Take 30 mLs by mouth every 4 (four) hours as needed for indigestion.   ENSURE NUTRITION SHAKE PO Take 1-2 Bottles by mouth daily. Reported on 11/30/2015   escitalopram 5 MG tablet Commonly known as: LEXAPRO Take 1 tablet (5 mg total) by mouth daily.  famotidine 20 MG tablet Commonly known as: PEPCID Take 1 tablet (20 mg total) by mouth 2 (two) times daily.   fluticasone 50 MCG/ACT nasal spray Commonly known as: FLONASE Use 2 spray(s) in each nostril once daily   Fluticasone-Salmeterol(sensor) 113-14 MCG/ACT Aepb Inhale 1 puff into the lungs 2 (two) times daily.   ketotifen 0.025 % ophthalmic solution Commonly known as: ZADITOR Place 1 drop into both eyes 6 (six) times daily.   loratadine 10 MG tablet Commonly known as: EQ  Loratadine Take 1 tablet (10 mg total) by mouth daily. TAKE 1 TABLET BY MOUTH ONCE DAILY AS NEEDED FOR ALLERGIES   LORazepam 1 MG tablet Commonly known as: ATIVAN Take 1 tablet (1 mg total) by mouth 2 (two) times daily as needed for anxiety.   omeprazole 20 MG capsule Commonly known as: PRILOSEC Take 1 capsule (20 mg total) by mouth daily.   rosuvastatin 5 MG tablet Commonly known as: CRESTOR Take 1 tablet (5 mg total) by mouth daily.   SALINE NASAL MIST NA Place 1 spray into the nose 2 (two) times daily.   VITAMIN D PO Take 1 tablet by mouth daily.         Objective:   BP 109/63   Pulse 89   Ht 5\' 4"  (1.626 m)   Wt 128 lb (58.1 kg)   SpO2 96%   BMI 21.97 kg/m   Wt Readings from Last 3 Encounters:  03/09/23 128 lb (58.1 kg)  10/31/22 137 lb (62.1 kg)  08/01/22 143 lb (64.9 kg)    Physical Exam Vitals and nursing note reviewed.  Constitutional:      General: She is not in acute distress.    Appearance: She is well-developed. She is not diaphoretic.  Eyes:     Conjunctiva/sclera: Conjunctivae normal.  Cardiovascular:     Rate and Rhythm: Normal rate and regular rhythm.     Heart sounds: Normal heart sounds. No murmur heard. Pulmonary:     Effort: Pulmonary effort is normal. No respiratory distress.     Breath sounds: Normal breath sounds. No wheezing.  Musculoskeletal:        General: No tenderness. Normal range of motion.  Skin:    General: Skin is warm and dry.     Findings: No rash.  Neurological:     Mental Status: She is alert and oriented to person, place, and time.     Coordination: Coordination normal.  Psychiatric:        Mood and Affect: Mood is anxious. Mood is not depressed.        Behavior: Behavior normal.        Thought Content: Thought content does not include suicidal ideation. Thought content does not include suicidal plan.     Results for orders placed or performed in visit on 10/31/22  ToxASSURE Select 13 (MW), Urine  Result  Value Ref Range   Summary Note     Assessment & Plan:   Problem List Items Addressed This Visit       Digestive   GERD (gastroesophageal reflux disease)   Relevant Orders   CBC with Differential/Platelet     Other   Anxiety - Primary   Relevant Medications   LORazepam (ATIVAN) 1 MG tablet   Other Relevant Orders   CBC with Differential/Platelet   HLD (hyperlipidemia)   Relevant Orders   CBC with Differential/Platelet   CMP14+EGFR   Lipid panel    Continue current medicine, will do blood work today.  Seems to be doing well except for a couple of falls recently.  If she continues to have falls then we may have to back off on the medicine but otherwise she seems to be doing well. Follow up plan: Return in about 3 months (around 06/09/2023), or if symptoms worsen or fail to improve, for Anxiety and hyperlipidemia recheck.  Counseling provided for all of the vaccine components Orders Placed This Encounter  Procedures   CBC with Differential/Platelet   CMP14+EGFR   Lipid panel    Arville Care, MD [facility] 03/09/2023, 12:04 PM

## 2023-03-10 LAB — LIPID PANEL: HDL: 64 mg/dL (ref >39)

## 2023-03-10 LAB — CMP14+EGFR
Albumin: 3.8 g/dL (ref 3.7–4.7)
Potassium: 2.9 mmol/L — ABNORMAL LOW (ref 3.5–5.2)

## 2023-03-10 LAB — CBC WITH DIFFERENTIAL/PLATELET
Eos: 3 %
Immature Grans (Abs): 0 10*3/uL (ref 0.0–0.1)
Neutrophils Absolute: 5.5 10*3/uL (ref 1.4–7.0)

## 2023-03-12 ENCOUNTER — Other Ambulatory Visit: Payer: Self-pay

## 2023-03-12 MED ORDER — POTASSIUM CHLORIDE CRYS ER 10 MEQ PO TBCR: 10.0000 meq | EXTENDED_RELEASE_TABLET | Freq: Every day | ORAL | 0 refills | Status: DC

## 2023-06-08 ENCOUNTER — Other Ambulatory Visit: Payer: Self-pay | Admitting: Family Medicine

## 2023-06-08 DIAGNOSIS — F419 Anxiety disorder, unspecified: Secondary | ICD-10-CM

## 2023-06-10 ENCOUNTER — Ambulatory Visit: Payer: Medicare HMO | Admitting: Family Medicine

## 2023-06-10 ENCOUNTER — Other Ambulatory Visit: Payer: Self-pay

## 2023-06-10 ENCOUNTER — Encounter: Payer: Self-pay | Admitting: Family Medicine

## 2023-06-10 VITALS — BP 98/61 | HR 85 | Temp 97.5°F | Ht 64.0 in | Wt 125.0 lb

## 2023-06-10 DIAGNOSIS — K219 Gastro-esophageal reflux disease without esophagitis: Secondary | ICD-10-CM

## 2023-06-10 DIAGNOSIS — Z23 Encounter for immunization: Secondary | ICD-10-CM

## 2023-06-10 DIAGNOSIS — E876 Hypokalemia: Secondary | ICD-10-CM | POA: Diagnosis not present

## 2023-06-10 DIAGNOSIS — E782 Mixed hyperlipidemia: Secondary | ICD-10-CM

## 2023-06-10 DIAGNOSIS — F419 Anxiety disorder, unspecified: Secondary | ICD-10-CM | POA: Diagnosis not present

## 2023-06-10 MED ORDER — ROSUVASTATIN CALCIUM 5 MG PO TABS: 5.0000 mg | ORAL_TABLET | Freq: Every day | ORAL | 3 refills | Status: AC

## 2023-06-10 MED ORDER — LORAZEPAM 1 MG PO TABS
1.0000 mg | ORAL_TABLET | Freq: Two times a day (BID) | ORAL | 2 refills | Status: DC | PRN
Start: 2023-06-10 — End: 2023-09-09

## 2023-06-10 MED ORDER — POTASSIUM CHLORIDE CRYS ER 10 MEQ PO TBCR: 10.0000 meq | EXTENDED_RELEASE_TABLET | Freq: Every day | ORAL | 0 refills | Status: AC

## 2023-06-10 MED ORDER — OMEPRAZOLE 20 MG PO CPDR: 20.0000 mg | DELAYED_RELEASE_CAPSULE | Freq: Every day | ORAL | 3 refills | Status: DC

## 2023-06-10 MED ORDER — FAMOTIDINE 20 MG PO TABS
20.0000 mg | ORAL_TABLET | Freq: Two times a day (BID) | ORAL | 3 refills | Status: AC
Start: 2023-06-10 — End: ?

## 2023-06-10 MED ORDER — LORATADINE 10 MG PO TABS: 10.0000 mg | ORAL_TABLET | Freq: Every day | ORAL | 3 refills | Status: AC

## 2023-06-10 MED ORDER — ESCITALOPRAM OXALATE 5 MG PO TABS: 5.0000 mg | ORAL_TABLET | Freq: Every day | ORAL | 3 refills | Status: DC

## 2023-06-10 MED ORDER — FLUTICASONE PROPIONATE 50 MCG/ACT NA SUSP: NASAL | 3 refills | Status: DC

## 2023-06-10 NOTE — Progress Notes (Signed)
BP 98/61   Pulse 85   Temp (!) 97.5 F (36.4 C) (Temporal)   Ht 5\' 4"  (1.626 m)   Wt 125 lb (56.7 kg)   SpO2 96%   BMI 21.46 kg/m    Subjective:   Patient ID: Mikayla Wilson, female    DOB: [birth year], 89 y.o.   MRN: [id number]  HPI: Mikayla Wilson is a 89 y.o. female presenting on 06/10/2023 for Medical Management of Chronic Issues (3 month)   HPI Hyperlipidemia Patient is coming in for recheck of his hyperlipidemia. The patient is currently taking Crestor. They deny any issues with myalgias or history of liver damage from it. They deny any focal numbness or weakness or chest pain.   GERD Patient is currently on omeprazole.  She denies any major symptoms or abdominal pain or belching or burping. She denies any blood in her stool or lightheadedness or dizziness.   Anxiety recheck Current rx-lorazepam 1 mg twice daily as needed, patient also takes escitalopram # meds rx-40/month Effectiveness of current meds-works well Adverse reactions form meds-none Pill count performed-No Last drug screen -5-24 ( high risk q19m, moderate risk q32m, low risk yearly ) Urine drug screen today- No Was the NCCSR reviewed-yes  If yes were their any concerning findings? -None Controlled substance contract signed on: 11/13/2022  Relevant past medical, surgical, family and social history reviewed and updated as indicated. Interim medical history since our last visit reviewed. Allergies and medications reviewed and updated.  Review of Systems  Constitutional:  Negative for chills and fever.  HENT:  Negative for congestion, ear discharge and ear pain.   Eyes:  Negative for visual disturbance.  Respiratory:  Negative for chest tightness and shortness of breath.   Cardiovascular:  Negative for chest pain and leg swelling.  Genitourinary:  Negative for difficulty urinating and dysuria.  Musculoskeletal:  Negative for back pain and gait problem.  Skin:  Negative for rash.  Neurological:   Negative for dizziness, light-headedness and headaches.  Psychiatric/Behavioral:  Negative for agitation and behavioral problems.   All other systems reviewed and are negative.   Per HPI unless specifically indicated above   Allergies as of 06/10/2023       Reactions   Aspirin Other (See Comments)   "My doctor told me to never take aspirin"   Morphine And Codeine Other (See Comments)        Medication List        Accurate as of June 10, 2023  2:37 PM. If you have any questions, ask your nurse or doctor.          acetaminophen 325 MG tablet Commonly known as: TYLENOL Take 2 tablets (650 mg total) by mouth every 6 (six) hours as needed for mild pain (or Fever >/= 101).   Advair HFA 115-21 MCG/ACT inhaler Generic drug: fluticasone-salmeterol Inhale 2 puffs into the lungs 2 (two) times daily.   fluticasone-salmeterol 100-50 MCG/ACT Aepb Commonly known as: ADVAIR Inhale 1 puff into the lungs 2 (two) times daily.   albuterol 108 (90 Base) MCG/ACT inhaler Commonly known as: Ventolin HFA Inhale 1-2 puffs into the lungs every 6 (six) hours as needed for wheezing or shortness of breath.   alum & mag hydroxide-simeth 200-200-20 MG/5ML suspension Commonly known as: MAALOX/MYLANTA Take 30 mLs by mouth every 4 (four) hours as needed for indigestion.   ENSURE NUTRITION SHAKE PO Take 1-2 Bottles by mouth daily. Reported on 11/30/2015   escitalopram 5 MG tablet Commonly known as:  LEXAPRO Take 1 tablet (5 mg total) by mouth daily.   famotidine 20 MG tablet Commonly known as: PEPCID Take 1 tablet (20 mg total) by mouth 2 (two) times daily.   fluticasone 50 MCG/ACT nasal spray Commonly known as: FLONASE Use 2 spray(s) in each nostril once daily   Fluticasone-Salmeterol(sensor) 113-14 MCG/ACT Aepb Inhale 1 puff into the lungs 2 (two) times daily.   ketotifen 0.025 % ophthalmic solution Commonly known as: ZADITOR Place 1 drop into both eyes 6 (six) times daily.    loratadine 10 MG tablet Commonly known as: EQ Loratadine Take 1 tablet (10 mg total) by mouth daily. TAKE 1 TABLET BY MOUTH ONCE DAILY AS NEEDED FOR ALLERGIES   LORazepam 1 MG tablet Commonly known as: ATIVAN Take 1 tablet (1 mg total) by mouth 2 (two) times daily as needed for anxiety.   omeprazole 20 MG capsule Commonly known as: PRILOSEC Take 1 capsule (20 mg total) by mouth daily.   potassium chloride 10 MEQ tablet Commonly known as: KLOR-CON M Take 1 tablet (10 mEq total) by mouth daily.   rosuvastatin 5 MG tablet Commonly known as: CRESTOR Take 1 tablet (5 mg total) by mouth daily.   SALINE NASAL MIST NA Place 1 spray into the nose 2 (two) times daily.   VITAMIN D PO Take 1 tablet by mouth daily.         Objective:   BP 98/61   Pulse 85   Temp (!) 97.5 F (36.4 C) (Temporal)   Ht 5\' 4"  (1.626 m)   Wt 125 lb (56.7 kg)   SpO2 96%   BMI 21.46 kg/m   Wt Readings from Last 3 Encounters:  06/10/23 125 lb (56.7 kg)  03/09/23 128 lb (58.1 kg)  10/31/22 137 lb (62.1 kg)    Physical Exam Vitals and nursing note reviewed.  Constitutional:      General: She is not in acute distress.    Appearance: She is well-developed. She is not diaphoretic.  Eyes:     Conjunctiva/sclera: Conjunctivae normal.  Cardiovascular:     Rate and Rhythm: Normal rate and regular rhythm.     Heart sounds: Normal heart sounds. No murmur heard. Pulmonary:     Effort: Pulmonary effort is normal. No respiratory distress.     Breath sounds: Normal breath sounds. No wheezing.  Musculoskeletal:        General: Swelling (1+ edema bilateral lower extremities.) present. No tenderness. Normal range of motion.  Skin:    General: Skin is warm and dry.     Findings: No rash.  Neurological:     Mental Status: She is alert and oriented to person, place, and time.     Coordination: Coordination normal.  Psychiatric:        Behavior: Behavior normal.       Assessment & Plan:   Problem  List Items Addressed This Visit       Digestive   GERD (gastroesophageal reflux disease)   Relevant Medications   famotidine (PEPCID) 20 MG tablet   omeprazole (PRILOSEC) 20 MG capsule     Other   Anxiety   Relevant Medications   LORazepam (ATIVAN) 1 MG tablet   escitalopram (LEXAPRO) 5 MG tablet   HLD (hyperlipidemia) - Primary   Relevant Medications   rosuvastatin (CRESTOR) 5 MG tablet   Hypokalemia    Will recheck potassium levels today, labs are already in his future labs.  Sent refills for.  No changes except for recommended that  she add supplementation for her diet again.  She is losing weight again. Follow up plan: Return in about 3 months (around 09/10/2023), or if symptoms worsen or fail to improve, for Anxiety and hyperlipidemia recheck.  Counseling provided for all of the vaccine components No orders of the defined types were placed in this encounter.   Arville Care, MD [facility] 06/10/2023, 2:37 PM

## 2023-06-11 LAB — CMP14+EGFR
ALT: 10 [IU]/L (ref 0–32)
AST: 22 [IU]/L (ref 0–40)
Albumin: 3.6 g/dL (ref 3.6–4.6)
Alkaline Phosphatase: 80 [IU]/L (ref 44–121)
BUN/Creatinine Ratio: 8 — ABNORMAL LOW (ref 12–28)
BUN: 6 mg/dL — ABNORMAL LOW (ref 10–36)
Bilirubin Total: 0.4 mg/dL (ref 0.0–1.2)
CO2: 22 mmol/L (ref 20–29)
Calcium: 8.6 mg/dL — ABNORMAL LOW (ref 8.7–10.3)
Chloride: 103 mmol/L (ref 96–106)
Creatinine, Ser: 0.77 mg/dL (ref 0.57–1.00)
Globulin, Total: 2 g/dL (ref 1.5–4.5)
Glucose: 125 mg/dL — ABNORMAL HIGH (ref 70–99)
Potassium: 3.4 mmol/L — ABNORMAL LOW (ref 3.5–5.2)
Sodium: 140 mmol/L (ref 134–144)
Total Protein: 5.6 g/dL — ABNORMAL LOW (ref 6.0–8.5)
eGFR: 73 mL/min/{1.73_m2} (ref >59)

## 2023-06-11 LAB — CBC WITH DIFFERENTIAL/PLATELET
Basophils Absolute: 0 10*3/uL (ref 0.0–0.2)
Basos: 1 %
EOS (ABSOLUTE): 0.2 10*3/uL (ref 0.0–0.4)
Eos: 3 %
Hematocrit: 38.3 % (ref 34.0–46.6)
Hemoglobin: 12.4 g/dL (ref 11.1–15.9)
Immature Grans (Abs): 0 10*3/uL (ref 0.0–0.1)
Immature Granulocytes: 0 %
Lymphocytes Absolute: 2 10*3/uL (ref 0.7–3.1)
Lymphs: 31 %
MCH: 31.8 pg (ref 26.6–33.0)
MCHC: 32.4 g/dL (ref 31.5–35.7)
MCV: 98 fL — ABNORMAL HIGH (ref 79–97)
Monocytes Absolute: 0.3 10*3/uL (ref 0.1–0.9)
Monocytes: 5 %
Neutrophils Absolute: 4 10*3/uL (ref 1.4–7.0)
Neutrophils: 60 %
Platelets: 167 10*3/uL (ref 150–450)
RBC: 3.9 x10E6/uL (ref 3.77–5.28)
RDW: 12.8 % (ref 11.7–15.4)
WBC: 6.6 10*3/uL (ref 3.4–10.8)

## 2023-06-11 LAB — LIPID PANEL
Chol/HDL Ratio: 2.2 {ratio} (ref 0.0–4.4)
Cholesterol, Total: 137 mg/dL (ref 100–199)
HDL: 63 mg/dL (ref >39)
LDL Chol Calc (NIH): 53 mg/dL (ref 0–99)
Triglycerides: 117 mg/dL (ref 0–149)
VLDL Cholesterol Cal: 21 mg/dL (ref 5–40)

## 2023-09-02 ENCOUNTER — Emergency Department (HOSPITAL_COMMUNITY): Payer: Medicare HMO

## 2023-09-02 ENCOUNTER — Encounter (HOSPITAL_COMMUNITY): Payer: Self-pay | Admitting: Emergency Medicine

## 2023-09-02 ENCOUNTER — Inpatient Hospital Stay (HOSPITAL_COMMUNITY)
Admission: EM | Admit: 2023-09-02 | Discharge: 2023-09-09 | DRG: 871 | Disposition: A | Discharge status: Skilled Nursing Facility | Payer: Medicare HMO | Attending: Family Medicine | Admitting: Family Medicine

## 2023-09-02 ENCOUNTER — Other Ambulatory Visit: Payer: Self-pay

## 2023-09-02 DIAGNOSIS — B961 Klebsiella pneumoniae [K. pneumoniae] as the cause of diseases classified elsewhere: Secondary | ICD-10-CM | POA: Diagnosis present

## 2023-09-02 DIAGNOSIS — R9431 Abnormal electrocardiogram [ECG] [EKG]: Secondary | ICD-10-CM | POA: Diagnosis not present

## 2023-09-02 DIAGNOSIS — S32119A Unspecified Zone I fracture of sacrum, initial encounter for closed fracture: Secondary | ICD-10-CM | POA: Diagnosis not present

## 2023-09-02 DIAGNOSIS — J45909 Unspecified asthma, uncomplicated: Secondary | ICD-10-CM | POA: Diagnosis present

## 2023-09-02 DIAGNOSIS — R600 Localized edema: Secondary | ICD-10-CM | POA: Diagnosis not present

## 2023-09-02 DIAGNOSIS — A419 Sepsis, unspecified organism: Secondary | ICD-10-CM | POA: Diagnosis not present

## 2023-09-02 DIAGNOSIS — K219 Gastro-esophageal reflux disease without esophagitis: Secondary | ICD-10-CM | POA: Diagnosis not present

## 2023-09-02 DIAGNOSIS — A4150 Gram-negative sepsis, unspecified: Secondary | ICD-10-CM | POA: Diagnosis not present

## 2023-09-02 DIAGNOSIS — Z825 Family history of asthma and other chronic lower respiratory diseases: Secondary | ICD-10-CM

## 2023-09-02 DIAGNOSIS — E785 Hyperlipidemia, unspecified: Secondary | ICD-10-CM | POA: Diagnosis present

## 2023-09-02 DIAGNOSIS — Z8261 Family history of arthritis: Secondary | ICD-10-CM | POA: Diagnosis not present

## 2023-09-02 DIAGNOSIS — W19XXXA Unspecified fall, initial encounter: Secondary | ICD-10-CM | POA: Diagnosis present

## 2023-09-02 DIAGNOSIS — M549 Dorsalgia, unspecified: Secondary | ICD-10-CM | POA: Diagnosis not present

## 2023-09-02 DIAGNOSIS — R410 Disorientation, unspecified: Principal | ICD-10-CM

## 2023-09-02 DIAGNOSIS — G309 Alzheimer's disease, unspecified: Secondary | ICD-10-CM | POA: Diagnosis present

## 2023-09-02 DIAGNOSIS — Z833 Family history of diabetes mellitus: Secondary | ICD-10-CM

## 2023-09-02 DIAGNOSIS — Z7951 Long term (current) use of inhaled steroids: Secondary | ICD-10-CM

## 2023-09-02 DIAGNOSIS — G319 Degenerative disease of nervous system, unspecified: Secondary | ICD-10-CM | POA: Diagnosis not present

## 2023-09-02 DIAGNOSIS — F0284 Dementia in other diseases classified elsewhere, unspecified severity, with anxiety: Secondary | ICD-10-CM | POA: Diagnosis present

## 2023-09-02 DIAGNOSIS — Z7401 Bed confinement status: Secondary | ICD-10-CM | POA: Diagnosis not present

## 2023-09-02 DIAGNOSIS — G9341 Metabolic encephalopathy: Secondary | ICD-10-CM | POA: Diagnosis not present

## 2023-09-02 DIAGNOSIS — M810 Age-related osteoporosis without current pathological fracture: Secondary | ICD-10-CM | POA: Diagnosis present

## 2023-09-02 DIAGNOSIS — Z885 Allergy status to narcotic agent status: Secondary | ICD-10-CM | POA: Diagnosis not present

## 2023-09-02 DIAGNOSIS — R918 Other nonspecific abnormal finding of lung field: Secondary | ICD-10-CM | POA: Diagnosis not present

## 2023-09-02 DIAGNOSIS — Z1152 Encounter for screening for COVID-19: Secondary | ICD-10-CM | POA: Diagnosis not present

## 2023-09-02 DIAGNOSIS — J189 Pneumonia, unspecified organism: Secondary | ICD-10-CM

## 2023-09-02 DIAGNOSIS — Z79899 Other long term (current) drug therapy: Secondary | ICD-10-CM

## 2023-09-02 DIAGNOSIS — Y929 Unspecified place or not applicable: Secondary | ICD-10-CM

## 2023-09-02 DIAGNOSIS — N3000 Acute cystitis without hematuria: Secondary | ICD-10-CM | POA: Diagnosis present

## 2023-09-02 DIAGNOSIS — I700 Atherosclerosis of aorta: Secondary | ICD-10-CM | POA: Diagnosis not present

## 2023-09-02 DIAGNOSIS — M545 Low back pain, unspecified: Secondary | ICD-10-CM | POA: Diagnosis not present

## 2023-09-02 DIAGNOSIS — F419 Anxiety disorder, unspecified: Secondary | ICD-10-CM | POA: Diagnosis not present

## 2023-09-02 DIAGNOSIS — R102 Pelvic and perineal pain: Secondary | ICD-10-CM | POA: Diagnosis not present

## 2023-09-02 DIAGNOSIS — M419 Scoliosis, unspecified: Secondary | ICD-10-CM | POA: Diagnosis not present

## 2023-09-02 DIAGNOSIS — I959 Hypotension, unspecified: Secondary | ICD-10-CM | POA: Diagnosis not present

## 2023-09-02 DIAGNOSIS — Z720 Tobacco use: Secondary | ICD-10-CM | POA: Diagnosis not present

## 2023-09-02 DIAGNOSIS — R29898 Other symptoms and signs involving the musculoskeletal system: Secondary | ICD-10-CM | POA: Diagnosis not present

## 2023-09-02 DIAGNOSIS — R000 Tachycardia, unspecified: Secondary | ICD-10-CM | POA: Diagnosis not present

## 2023-09-02 DIAGNOSIS — Z8249 Family history of ischemic heart disease and other diseases of the circulatory system: Secondary | ICD-10-CM

## 2023-09-02 DIAGNOSIS — I2510 Atherosclerotic heart disease of native coronary artery without angina pectoris: Secondary | ICD-10-CM | POA: Diagnosis present

## 2023-09-02 DIAGNOSIS — M79605 Pain in left leg: Secondary | ICD-10-CM | POA: Diagnosis not present

## 2023-09-02 DIAGNOSIS — S32120D Nondisplaced Zone II fracture of sacrum, subsequent encounter for fracture with routine healing: Secondary | ICD-10-CM | POA: Diagnosis not present

## 2023-09-02 DIAGNOSIS — J181 Lobar pneumonia, unspecified organism: Secondary | ICD-10-CM | POA: Diagnosis present

## 2023-09-02 DIAGNOSIS — J9601 Acute respiratory failure with hypoxia: Secondary | ICD-10-CM | POA: Diagnosis present

## 2023-09-02 DIAGNOSIS — M47814 Spondylosis without myelopathy or radiculopathy, thoracic region: Secondary | ICD-10-CM | POA: Diagnosis not present

## 2023-09-02 DIAGNOSIS — M8588 Other specified disorders of bone density and structure, other site: Secondary | ICD-10-CM | POA: Diagnosis not present

## 2023-09-02 DIAGNOSIS — I6782 Cerebral ischemia: Secondary | ICD-10-CM | POA: Diagnosis not present

## 2023-09-02 DIAGNOSIS — M79604 Pain in right leg: Secondary | ICD-10-CM | POA: Diagnosis not present

## 2023-09-02 LAB — RESPIRATORY PANEL BY PCR

## 2023-09-02 LAB — COMPREHENSIVE METABOLIC PANEL
ALT: 16 U/L (ref 0–44)
AST: 22 U/L (ref 15–41)
Albumin: 3.7 g/dL (ref 3.5–5.0)
Alkaline Phosphatase: 84 U/L (ref 38–126)
Anion gap: 14 (ref 5–15)
BUN: 18 mg/dL (ref 8–23)
CO2: 21 mmol/L — ABNORMAL LOW (ref 22–32)
Calcium: 9.1 mg/dL (ref 8.9–10.3)
Chloride: 103 mmol/L (ref 98–111)
Creatinine, Ser: 0.89 mg/dL (ref 0.44–1.00)
GFR, Estimated: >60 mL/min (ref >60)
Glucose, Bld: 140 mg/dL — ABNORMAL HIGH (ref 70–99)
Potassium: 3.9 mmol/L (ref 3.5–5.1)
Sodium: 138 mmol/L (ref 135–145)
Total Bilirubin: 1.3 mg/dL — ABNORMAL HIGH (ref 0.0–1.2)
Total Protein: 7.4 g/dL (ref 6.5–8.1)

## 2023-09-02 LAB — RESP PANEL BY RT-PCR (RSV, FLU A&B, COVID)  RVPGX2
Influenza A by PCR: NEGATIVE
Influenza B by PCR: NEGATIVE
Resp Syncytial Virus by PCR: NEGATIVE
SARS Coronavirus 2 by RT PCR: NEGATIVE

## 2023-09-02 LAB — CBC WITH DIFFERENTIAL/PLATELET
Abs Immature Granulocytes: 0.07 10*3/uL (ref 0.00–0.07)
Basophils Absolute: 0 10*3/uL (ref 0.0–0.1)
Basophils Relative: 0 %
Eosinophils Absolute: 0 10*3/uL (ref 0.0–0.5)
Eosinophils Relative: 0 %
HCT: 39.8 % (ref 36.0–46.0)
Hemoglobin: 13 g/dL (ref 12.0–15.0)
Immature Granulocytes: 1 %
Lymphocytes Relative: 14 %
Lymphs Abs: 1.7 10*3/uL (ref 0.7–4.0)
MCH: 30.8 pg (ref 26.0–34.0)
MCHC: 32.7 g/dL (ref 30.0–36.0)
MCV: 94.3 fL (ref 80.0–100.0)
Monocytes Absolute: 0.9 10*3/uL (ref 0.1–1.0)
Monocytes Relative: 8 %
Neutro Abs: 9.4 10*3/uL — ABNORMAL HIGH (ref 1.7–7.7)
Neutrophils Relative %: 77 %
Platelets: 157 10*3/uL (ref 150–400)
RBC: 4.22 MIL/uL (ref 3.87–5.11)
RDW: 12.4 % (ref 11.5–15.5)
WBC: 12.1 10*3/uL — ABNORMAL HIGH (ref 4.0–10.5)
nRBC: 0 % (ref 0.0–0.2)

## 2023-09-02 LAB — URINALYSIS, W/ REFLEX TO CULTURE (INFECTION SUSPECTED)
Bilirubin Urine: NEGATIVE
Glucose, UA: NEGATIVE mg/dL
Ketones, ur: 5 mg/dL — AB
Nitrite: NEGATIVE
Protein, ur: 100 mg/dL — AB
RBC / HPF: >50 RBC/hpf (ref 0–5)
Specific Gravity, Urine: 1.02 (ref 1.005–1.030)
WBC, UA: >50 WBC/hpf (ref 0–5)
pH: 5 (ref 5.0–8.0)

## 2023-09-02 LAB — AMMONIA: Ammonia: 19 umol/L (ref 9–35)

## 2023-09-02 LAB — PROCALCITONIN: Procalcitonin: 0.12 ng/mL

## 2023-09-02 LAB — LACTIC ACID, PLASMA: Lactic Acid, Venous: 1.8 mmol/L (ref 0.5–1.9)

## 2023-09-02 MED ORDER — MAGNESIUM HYDROXIDE 400 MG/5ML PO SUSP: 30.0000 mL | Freq: Every day | ORAL | Status: DC | PRN

## 2023-09-02 MED ORDER — IPRATROPIUM-ALBUTEROL 0.5-2.5 (3) MG/3ML IN SOLN
3.0000 mL | Freq: Three times a day (TID) | RESPIRATORY_TRACT | Status: DC
  Administered 2023-09-02: 3 mL via RESPIRATORY_TRACT
  Filled 2023-09-02 (×2): qty 3

## 2023-09-02 MED ORDER — IPRATROPIUM-ALBUTEROL 0.5-2.5 (3) MG/3ML IN SOLN: 3.0000 mL | Freq: Four times a day (QID) | RESPIRATORY_TRACT | Status: DC | PRN

## 2023-09-02 MED ORDER — ENOXAPARIN SODIUM 40 MG/0.4ML IJ SOSY
40.0000 mg | PREFILLED_SYRINGE | INTRAMUSCULAR | Status: DC
  Administered 2023-09-02: 40 mg via SUBCUTANEOUS
  Filled 2023-09-02: qty 0.4

## 2023-09-02 MED ORDER — LORAZEPAM 2 MG/ML IJ SOLN
0.5000 mg | Freq: Once | INTRAMUSCULAR | Status: AC
  Administered 2023-09-02: 0.5 mg via INTRAVENOUS

## 2023-09-02 MED ORDER — LORAZEPAM 2 MG/ML IJ SOLN
1.0000 mg | Freq: Once | INTRAMUSCULAR | Status: AC
  Administered 2023-09-02: 1 mg via INTRAVENOUS
  Filled 2023-09-02: qty 1

## 2023-09-02 MED ORDER — ENOXAPARIN SODIUM 40 MG/0.4ML IJ SOSY: 40.0000 mg | PREFILLED_SYRINGE | INTRAMUSCULAR | Status: DC

## 2023-09-02 MED ORDER — GUAIFENESIN ER 600 MG PO TB12
600.0000 mg | ORAL_TABLET | Freq: Two times a day (BID) | ORAL | Status: DC
  Filled 2023-09-02: qty 1

## 2023-09-02 MED ORDER — SODIUM CHLORIDE 0.9 % IV SOLN: 500.0000 mg | INTRAVENOUS | Status: DC

## 2023-09-02 MED ORDER — ENOXAPARIN SODIUM 30 MG/0.3ML IJ SOSY
30.0000 mg | PREFILLED_SYRINGE | INTRAMUSCULAR | Status: DC
  Administered 2023-09-03 – 2023-09-04 (×2): 30 mg via SUBCUTANEOUS
  Filled 2023-09-02 (×2): qty 0.3

## 2023-09-02 MED ORDER — TRAZODONE HCL 50 MG PO TABS
25.0000 mg | ORAL_TABLET | Freq: Every evening | ORAL | Status: DC | PRN
  Administered 2023-09-05 – 2023-09-09 (×4): 25 mg via ORAL
  Filled 2023-09-02 (×5): qty 1

## 2023-09-02 MED ORDER — LORAZEPAM 2 MG/ML IJ SOLN
0.5000 mg | Freq: Four times a day (QID) | INTRAMUSCULAR | Status: DC | PRN
  Administered 2023-09-02 – 2023-09-03 (×3): 0.5 mg via INTRAVENOUS
  Filled 2023-09-02 (×3): qty 1

## 2023-09-02 MED ORDER — LORAZEPAM 2 MG/ML IJ SOLN
INTRAMUSCULAR | Status: AC
  Filled 2023-09-02: qty 1

## 2023-09-02 MED ORDER — ACETAMINOPHEN 325 MG PO TABS
650.0000 mg | ORAL_TABLET | Freq: Four times a day (QID) | ORAL | Status: DC | PRN
  Administered 2023-09-04: 650 mg via ORAL
  Filled 2023-09-02: qty 2

## 2023-09-02 MED ORDER — SODIUM CHLORIDE 0.9 % IV BOLUS
1000.0000 mL | Freq: Once | INTRAVENOUS | Status: AC
  Administered 2023-09-02: 1000 mL via INTRAVENOUS

## 2023-09-02 MED ORDER — ACETAMINOPHEN 650 MG RE SUPP: 650.0000 mg | Freq: Four times a day (QID) | RECTAL | Status: DC | PRN

## 2023-09-02 MED ORDER — PANTOPRAZOLE SODIUM 40 MG IV SOLR
40.0000 mg | INTRAVENOUS | Status: DC
  Administered 2023-09-02 – 2023-09-05 (×4): 40 mg via INTRAVENOUS
  Filled 2023-09-02 (×4): qty 10

## 2023-09-02 MED ORDER — LORATADINE 10 MG PO TABS: 10.0000 mg | ORAL_TABLET | Freq: Every day | ORAL | Status: DC

## 2023-09-02 MED ORDER — ESCITALOPRAM OXALATE 10 MG PO TABS: 5.0000 mg | ORAL_TABLET | Freq: Every day | ORAL | Status: DC

## 2023-09-02 MED ORDER — ONDANSETRON HCL 4 MG PO TABS: 4.0000 mg | ORAL_TABLET | Freq: Four times a day (QID) | ORAL | Status: DC | PRN

## 2023-09-02 MED ORDER — LACTATED RINGERS IV SOLN
150.0000 mL/h | INTRAVENOUS | Status: DC
  Administered 2023-09-02: 150 mL/h via INTRAVENOUS

## 2023-09-02 MED ORDER — HYDROCOD POLI-CHLORPHE POLI ER 10-8 MG/5ML PO SUER: 5.0000 mL | Freq: Two times a day (BID) | ORAL | Status: DC | PRN

## 2023-09-02 MED ORDER — SODIUM CHLORIDE 0.9 % IV SOLN
2.0000 g | INTRAVENOUS | Status: AC
  Administered 2023-09-02 – 2023-09-06 (×5): 2 g via INTRAVENOUS
  Filled 2023-09-02 (×5): qty 20

## 2023-09-02 MED ORDER — LACTATED RINGERS IV SOLN: INTRAVENOUS | Status: DC

## 2023-09-02 MED ORDER — ENSURE NUTRITION SHAKE PO LIQD: Freq: Every day | ORAL | Status: DC

## 2023-09-02 MED ORDER — SODIUM CHLORIDE 0.9 % IV SOLN
1.0000 g | Freq: Once | INTRAVENOUS | Status: AC
  Administered 2023-09-02: 1 g via INTRAVENOUS
  Filled 2023-09-02: qty 10

## 2023-09-02 MED ORDER — PANTOPRAZOLE SODIUM 40 MG PO TBEC: 40.0000 mg | DELAYED_RELEASE_TABLET | Freq: Every day | ORAL | Status: DC

## 2023-09-02 MED ORDER — SODIUM CHLORIDE 0.9 % IV SOLN
500.0000 mg | INTRAVENOUS | Status: AC
  Administered 2023-09-02 – 2023-09-06 (×5): 500 mg via INTRAVENOUS
  Filled 2023-09-02 (×5): qty 5

## 2023-09-02 MED ORDER — ONDANSETRON HCL 4 MG/2ML IJ SOLN: 4.0000 mg | Freq: Four times a day (QID) | INTRAMUSCULAR | Status: DC | PRN

## 2023-09-02 MED ORDER — ROSUVASTATIN CALCIUM 5 MG PO TABS: 5.0000 mg | ORAL_TABLET | Freq: Every day | ORAL | Status: DC

## 2023-09-02 NOTE — ED Provider Notes (Signed)
 [place] EMERGENCY DEPARTMENT AT [facility] Provider Note   CSN: [id number] Arrival date & time: 09/02/23  0108     History  Chief Complaint  Patient presents with   Altered Mental Status   Level 5 caveat due to altered mental status Mikayla Wilson is a 89 y.o. female.  The history is provided by the patient. The history is limited by the condition of the patient.  Patient history of CAD presents with confusion. It is reported patient is had more confusion over the past day.  There is also been reports of back pain.  EMS was unable to provide much history and patient is unable provide any history Past Medical History:  Diagnosis Date   Allergy    Anxiety    Arthritis    Asthma    Cataract    Constipation    Coronary artery disease    Depression    Fibrocystic breast disease    GERD (gastroesophageal reflux disease)    Heart murmur    Hiatal hernia    Hiatal hernia    Osteoporosis      Home Medications Prior to Admission medications   Medication Sig Start Date End Date Taking? Authorizing Provider  acetaminophen (TYLENOL) 325 MG tablet Take 2 tablets (650 mg total) by mouth every 6 (six) hours as needed for mild pain (or Fever >/= 101). 12/03/15   Calvert Cantor, MD  albuterol (VENTOLIN HFA) 108 (90 Base) MCG/ACT inhaler Inhale 1-2 puffs into the lungs every 6 (six) hours as needed for wheezing or shortness of breath. 10/30/21   Dettinger, Elige Radon, MD  alum & mag hydroxide-simeth (MAALOX/MYLANTA) 200-200-20 MG/5ML suspension Take 30 mLs by mouth every 4 (four) hours as needed for indigestion. 12/03/15   Calvert Cantor, MD  Cholecalciferol (VITAMIN D PO) Take 1 tablet by mouth daily.    [provider]  escitalopram (LEXAPRO) 5 MG tablet Take 1 tablet (5 mg total) by mouth daily. 06/10/23   Dettinger, Elige Radon, MD  famotidine (PEPCID) 20 MG tablet Take 1 tablet (20 mg total) by mouth 2 (two) times daily. 06/10/23   Dettinger, Elige Radon, MD   fluticasone Aleda Grana) 50 MCG/ACT nasal spray Use 2 spray(s) in each nostril once daily 06/10/23   Dettinger, Elige Radon, MD  fluticasone-salmeterol (ADVAIR HFA) 115-21 MCG/ACT inhaler Inhale 2 puffs into the lungs 2 (two) times daily. 10/30/21   Dettinger, Elige Radon, MD  fluticasone-salmeterol (ADVAIR) 100-50 MCG/ACT AEPB Inhale 1 puff into the lungs 2 (two) times daily. 08/13/22   Dettinger, Elige Radon, MD  Fluticasone-Salmeterol,sensor, [phone] MCG/ACT AEPB Inhale 1 puff into the lungs 2 (two) times daily. 08/13/22   Dettinger, Elige Radon, MD  ketotifen (ZADITOR) 0.025 % ophthalmic solution Place 1 drop into both eyes 6 (six) times daily.    [provider]  loratadine (EQ LORATADINE) 10 MG tablet Take 1 tablet (10 mg total) by mouth daily. TAKE 1 TABLET BY MOUTH ONCE DAILY AS NEEDED FOR ALLERGIES 06/10/23   Dettinger, Elige Radon, MD  LORazepam (ATIVAN) 1 MG tablet Take 1 tablet (1 mg total) by mouth 2 (two) times daily as needed for anxiety. 06/10/23   Dettinger, Elige Radon, MD  Nutritional Supplements (ENSURE NUTRITION SHAKE PO) Take 1-2 Bottles by mouth daily. Reported on 11/30/2015    [provider]  omeprazole (PRILOSEC) 20 MG capsule Take 1 capsule (20 mg total) by mouth daily. 06/10/23   Dettinger, Elige Radon, MD  potassium chloride (KLOR-CON M) 10 MEQ tablet  Take 1 tablet (10 mEq total) by mouth daily. 06/10/23   Dettinger, Elige Radon, MD  rosuvastatin (CRESTOR) 5 MG tablet Take 1 tablet (5 mg total) by mouth daily. 06/10/23   Dettinger, Elige Radon, MD  SALINE NASAL MIST NA Place 1 spray into the nose 2 (two) times daily.    [provider]      Allergies    Aspirin and Morphine and codeine    Review of Systems   Review of Systems  Unable to perform ROS: Mental status change    Physical Exam Updated Vital Signs BP 125/73   Pulse 99   Temp 98.7 F (37.1 C) (Rectal)   Resp 19   Ht 1.626 m (5\' 4" )   Wt 56.7 kg   SpO2 93%   BMI 21.46 kg/m  Physical  Exam CONSTITUTIONAL: Elderly, frail HEAD: Normocephalic/atraumatic EYES: EOMI/PERRL ENMT: Mucous membranes dry, poor dentition NECK: supple no meningeal signs SPINE/BACK: No bruising/crepitance/stepoffs noted to spine CV: S1/S2 noted, no murmurs/rubs/gallops noted LUNGS: Lungs are clear to auscultation bilaterally, no apparent distress ABDOMEN: soft, suprapubic tenderness, no rebound or guarding, bowel sounds noted throughout abdomen NEURO: Pt is awake and alert, no facial droop.  No arm drift.  Patient can move both legs but appears to be limited due to pain.  She is pleasantly confused EXTREMITIES: pulses normal/equal, full ROM, no deformities SKIN: warm, color normal   ED Results / Procedures / Treatments   Labs (all labs ordered are listed, but only abnormal results are displayed) Labs Reviewed  COMPREHENSIVE METABOLIC PANEL - Abnormal; Notable for the following components:      Result Value   CO2 21 (*)    Glucose, Bld 140 (*)    Total Bilirubin 1.3 (*)    All other components within normal limits  CBC WITH DIFFERENTIAL/PLATELET - Abnormal; Notable for the following components:   WBC 12.1 (*)    Neutro Abs 9.4 (*)    All other components within normal limits  URINALYSIS, W/ REFLEX TO CULTURE (INFECTION SUSPECTED) - Abnormal; Notable for the following components:   APPearance TURBID (*)    Hgb urine dipstick SMALL (*)    Ketones, ur 5 (*)    Protein, ur 100 (*)    Leukocytes,Ua MODERATE (*)    Bacteria, UA MANY (*)    All other components within normal limits  RESP PANEL BY RT-PCR (RSV, FLU A&B, COVID)  RVPGX2  CULTURE, BLOOD (ROUTINE X 2)  CULTURE, BLOOD (ROUTINE X 2)  URINE CULTURE  LACTIC ACID, PLASMA  AMMONIA    EKG ED ECG REPORT   Date: 09/02/2023 0245am  Rate: 103  Rhythm: atrial flutter poor quality, but suspect atrial flutter with 2 1 block  QRS Axis: left  Intervals: normal  ST/T Wave abnormalities: nonspecific ST changes  Conduction  Disutrbances:right bundle branch block  Narrative Interpretation:   Old EKG Reviewed: changes noted  I have personally reviewed the EKG tracing and agree with the computerized printout as noted.   Radiology CT Head Wo Contrast Result Date: 09/02/2023 CLINICAL DATA:  Delirium EXAM: CT HEAD WITHOUT CONTRAST TECHNIQUE: Contiguous axial images were obtained from the base of the skull through the vertex without intravenous contrast. RADIATION DOSE REDUCTION: This exam was performed according to the departmental dose-optimization program which includes automated exposure control, adjustment of the mA and/or kV according to patient size and/or use of iterative reconstruction technique. COMPARISON:  None Available. FINDINGS: Brain: No evidence of acute infarction, hemorrhage, hydrocephalus, extra-axial collection or  mass lesion/mass effect. Cerebral atrophy. Partially empty sella. Vascular: No hyperdense vessel or unexpected calcification. Skull: No acute fracture. Sinuses/Orbits: No acute finding. Other: No mastoid effusions. IMPRESSION: No evidence of acute intracranial abnormality. Electronically Signed   By: Feliberto Harts M.D.   On: 09/02/2023 03:49   DG Chest Port 1 View Result Date: 09/02/2023 CLINICAL DATA:  Sepsis EXAM: PORTABLE CHEST 1 VIEW COMPARISON:  08/18/2017 FINDINGS: The lungs are symmetrically well expanded. Reticulonodular infiltrates have developed asymmetrically within the upper lung zones bilaterally, more severe within right upper lobe, likely reflecting changes of atypical infection, though hypersensitivity pneumonitis or smoking related lung disease could appear similarly in the chronic setting. No pneumothorax or pleural effusion. Cardiac size within limits. Pulmonary vascularity is normal. No acute bone abnormality., IMPRESSION: 1. Reticulonodular infiltrates within the upper lung zones bilaterally, more severe within right upper lobe, likely reflecting changes of atypical infection  in the acute setting. Electronically Signed   By: Helyn Numbers M.D.   On: 09/02/2023 03:19    Procedures Procedures    Medications Ordered in ED Medications  cefTRIAXone (ROCEPHIN) 1 g in sodium chloride 0.9 % 100 mL IVPB (1 g Intravenous New Bag/Given 09/02/23 0524)  lactated ringers infusion (has no administration in time range)  enoxaparin (LOVENOX) injection 40 mg (has no administration in time range)  cefTRIAXone (ROCEPHIN) 2 g in sodium chloride 0.9 % 100 mL IVPB (has no administration in time range)  azithromycin (ZITHROMAX) 500 mg in sodium chloride 0.9 % 250 mL IVPB (has no administration in time range)  ondansetron (ZOFRAN) tablet 4 mg (has no administration in time range)    Or  ondansetron (ZOFRAN) injection 4 mg (has no administration in time range)  magnesium hydroxide (MILK OF MAGNESIA) suspension 30 mL (has no administration in time range)  traZODone (DESYREL) tablet 25 mg (has no administration in time range)  acetaminophen (TYLENOL) tablet 650 mg (has no administration in time range)    Or  acetaminophen (TYLENOL) suppository 650 mg (has no administration in time range)  guaiFENesin (MUCINEX) 12 hr tablet 600 mg (has no administration in time range)  chlorpheniramine-HYDROcodone (TUSSIONEX) 10-8 MG/5ML suspension 5 mL (has no administration in time range)  ipratropium-albuterol (DUONEB) 0.5-2.5 (3) MG/3ML nebulizer solution 3 mL (has no administration in time range)  LORazepam (ATIVAN) injection 0.5 mg (0.5 mg Intravenous Given 09/02/23 0522)  sodium chloride 0.9 % bolus 1,000 mL (1,000 mLs Intravenous New Bag/Given 09/02/23 0527)    ED Course/ Medical Decision Making/ A&P Clinical Course as of 09/02/23 0541  Wed Sep 02, 2023  0248 I spoke to her daughter [name] via the phone.  She reports over the past days she has had difficulty walking and back pain.  She is also had some confusion.  No recent falls or trauma.  She can typically ambulate independently.  No new  medications.  She reports she has been diagnosed with "a touch of Alzheimer's"by her PCP [DW]  0338 WBC(!): 12.1 Leukocytosis [DW]  0541 Labs reveal UTI.  She may also have a pneumonia though no real respiratory symptoms.  Lactic acid is negative. Suspect patient has an episode of acute delirium triggered by urinary tract infection. Patient is not mentioned any pain on repeat exam but is mildly agitated [DW]  0541 Updated daughter on plan.  Discussed with Dr. Arville Care for admission [DW]    Clinical Course User Index [DW] Zadie Rhine, MD         CHA2DS2-VASc Score: 3  Medical Decision Making Amount and/or Complexity of Data Reviewed Labs: ordered. Decision-making details documented in ED Course. Radiology: ordered.  Risk Prescription drug management. Decision regarding hospitalization.   This patient presents to the ED for concern of confusion, this involves an extensive number of treatment options, and is a complaint that carries with it a high risk of complications and morbidity.  The differential diagnosis includes but is not limited to  CVA, intracranial hemorrhage, acute coronary syndrome, renal failure, urinary tract infection, electrolyte disturbance, pneumonia   Comorbidities that complicate the patient evaluation: Patient's presentation is complicated by their history of dementia  Social Determinants of Health: Patient's  confusion and poor mobility   increases the complexity of managing their presentation  Additional history obtained: Additional history obtained from family Records reviewed Primary Care Documents  Lab Tests: I Ordered, and personally interpreted labs.  The pertinent results include:  leukocytosis, UTI  Imaging Studies ordered: I ordered imaging studies including CT scan head and X-ray chest   I independently visualized and interpreted imaging which showed ?pneumonia I agree with the radiologist interpretation  Cardiac  Monitoring: The patient was maintained on a cardiac monitor.  I personally viewed and interpreted the cardiac monitor which showed an underlying rhythm of:  Atrial Flutter and artifact  Medicines ordered and prescription drug management: I ordered medication including rocephin & Azithromycin  for UTI and possible pneumonia  Reevaluation of the patient after these medicines showed that the patient    stayed the same   Critical Interventions:  admission for treatment of UTI/CAP and delirium  Consultations Obtained: I requested consultation with the admitting physician triad , and discussed  findings as well as pertinent plan - they recommend: admit  Reevaluation: After the interventions noted above, I reevaluated the patient and found that they have :stayed the same  Complexity of problems addressed: Patient's presentation is most consistent with  acute presentation with potential threat to life or bodily function  Disposition: After consideration of the diagnostic results and the patient's response to treatment,  I feel that the patent would benefit from admission   .           Final Clinical Impression(s) / ED Diagnoses Final diagnoses:  Delirium  Community acquired pneumonia, unspecified laterality  Acute cystitis without hematuria    Rx / DC Orders ED Discharge Orders     None         Zadie Rhine, MD 09/02/23 [id number]

## 2023-09-02 NOTE — ED Notes (Signed)
 RN attempted to feed pt her breakfast but pt is very confused and not understanding the situation. Food and drink placed to pt's mouth but she will not attempt to eat or swallow food/drink even after redirecting pt multiple times.

## 2023-09-02 NOTE — ED Triage Notes (Signed)
 Pt BIB EMS from home with initial c/o increased back pain with decreased mobility. Pt's family also states that the pt isn't acting like herself, but has history of dementia.

## 2023-09-02 NOTE — ED Notes (Signed)
 Pt continuing to pull at mitts and mitts are rubbing wrists. Mitts are placed on loosely. Kerlix applied to wrists to cover redness and protect skin. MD paged regarding pt status and possible need for more medication. Pt continues to be agitated.

## 2023-09-02 NOTE — H&P (Signed)
 History and Physical    Patient: Mikayla Wilson MWU:[id number] DOB: 07/21/1930 DOA: 09/02/2023 DOS: the patient was seen and examined on 09/02/2023 PCP: Dettinger, Elige Radon, MD  Patient coming from: Home  Chief Complaint:  Chief Complaint  Patient presents with   Altered Mental Status   HPI: Mikayla Wilson is a 89 year old female with a history of cognitive impairment, coronary artery disease, anxiety, GERD, hyperlipidemia, asthma presenting with generalized weakness and confusion for 2 days.  History is obtained from the patient's daughter.  Patient is unable provide any history secondary to her acute encephalopathy.  At baseline, the patient ambulates with a walker.  Daughter states that the patient has " a touch of dementia".  Daughter helps assist the patient with bathing and clothing.  Daughter has noted that the patient has had decreased oral intake with confusion and agitation for last 2 days.  There is no reports of vomiting, diarrhea, abdominal pain, hemoptysis, fever, chills. In the ED, the patient was afebrile hemodynamically stable with oxygen saturation 94% room air.  WBC 12.1, hemoglobin 13.0, platelets 157.  Sodium 130, potassium 3.9, bicarbonate 21, serum creatinine 0.9.  LFTs were unremarkable.  Ammonia 19.  COVID-19 PCR is negative.  Lactic acid 1.8.  UA showed>50 WBC.  Chest x-ray showed upper lobe opacities.  CT brain was negative for acute findings.  Patient was started on ceftriaxone and azithromycin.  She was admitted for further evaluation treatment for confusion.  Review of Systems: As mentioned in the history of present illness. All other systems reviewed and are negative. Past Medical History:  Diagnosis Date   Allergy    Anxiety    Arthritis    Asthma    Cataract    Constipation    Coronary artery disease    Depression    Fibrocystic breast disease    GERD (gastroesophageal reflux disease)    Heart murmur    Hiatal hernia    Hiatal hernia     Osteoporosis    Past Surgical History:  Procedure Laterality Date   ABDOMINAL HYSTERECTOMY     CHOLECYSTECTOMY     CHOLECYSTECTOMY     EYE SURGERY     HIP SURGERY Right    INTRAMEDULLARY (IM) NAIL INTERTROCHANTERIC Right 11/30/2015   Procedure: INTRAMEDULLARY (IM) NAIL INTERTROCHANTERIC RIGHT HIP;  Surgeon: Kathryne Hitch, MD;  Location: [facility]S;  Service: Orthopedics;  Laterality: Right;   Social History:  reports that she has never smoked. Her smokeless tobacco use includes snuff. She reports that she does not drink alcohol and does not use drugs.  Allergies  Allergen Reactions   Aspirin Other (See Comments)    "My doctor told me to never take aspirin"   Morphine And Codeine Other (See Comments)    Family History  Problem Relation Age of Onset   Arthritis Mother    Diabetes Mother    Heart disease Mother    Hypertension Mother    Miscarriages / [place] Mother    Alcohol abuse Father    COPD Father    Cancer Sister        pancriatic cancer   Alcohol abuse Brother    Early death Brother    Heart disease Brother    Hypertension Brother     Prior to Admission medications   Medication Sig Start Date End Date Taking? Authorizing Provider  acetaminophen (TYLENOL) 325 MG tablet Take 2 tablets (650 mg total) by mouth every 6 (six) hours as needed for mild pain (or  Fever >/= 101). 12/03/15   Calvert Cantor, MD  albuterol (VENTOLIN HFA) 108 (90 Base) MCG/ACT inhaler Inhale 1-2 puffs into the lungs every 6 (six) hours as needed for wheezing or shortness of breath. 10/30/21   Dettinger, Elige Radon, MD  alum & mag hydroxide-simeth (MAALOX/MYLANTA) 200-200-20 MG/5ML suspension Take 30 mLs by mouth every 4 (four) hours as needed for indigestion. 12/03/15   Calvert Cantor, MD  Cholecalciferol (VITAMIN D PO) Take 1 tablet by mouth daily.    [provider]  escitalopram (LEXAPRO) 5 MG tablet Take 1 tablet (5 mg total) by mouth daily. 06/10/23   Dettinger, Elige Radon, MD   famotidine (PEPCID) 20 MG tablet Take 1 tablet (20 mg total) by mouth 2 (two) times daily. 06/10/23   Dettinger, Elige Radon, MD  fluticasone Aleda Grana) 50 MCG/ACT nasal spray Use 2 spray(s) in each nostril once daily 06/10/23   Dettinger, Elige Radon, MD  fluticasone-salmeterol (ADVAIR HFA) 115-21 MCG/ACT inhaler Inhale 2 puffs into the lungs 2 (two) times daily. 10/30/21   Dettinger, Elige Radon, MD  fluticasone-salmeterol (ADVAIR) 100-50 MCG/ACT AEPB Inhale 1 puff into the lungs 2 (two) times daily. 08/13/22   Dettinger, Elige Radon, MD  Fluticasone-Salmeterol,sensor, [phone] MCG/ACT AEPB Inhale 1 puff into the lungs 2 (two) times daily. 08/13/22   Dettinger, Elige Radon, MD  ketotifen (ZADITOR) 0.025 % ophthalmic solution Place 1 drop into both eyes 6 (six) times daily.    [provider]  loratadine (EQ LORATADINE) 10 MG tablet Take 1 tablet (10 mg total) by mouth daily. TAKE 1 TABLET BY MOUTH ONCE DAILY AS NEEDED FOR ALLERGIES 06/10/23   Dettinger, Elige Radon, MD  LORazepam (ATIVAN) 1 MG tablet Take 1 tablet (1 mg total) by mouth 2 (two) times daily as needed for anxiety. 06/10/23   Dettinger, Elige Radon, MD  Nutritional Supplements (ENSURE NUTRITION SHAKE PO) Take 1-2 Bottles by mouth daily. Reported on 11/30/2015    [provider]  omeprazole (PRILOSEC) 20 MG capsule Take 1 capsule (20 mg total) by mouth daily. 06/10/23   Dettinger, Elige Radon, MD  potassium chloride (KLOR-CON M) 10 MEQ tablet Take 1 tablet (10 mEq total) by mouth daily. 06/10/23   Dettinger, Elige Radon, MD  rosuvastatin (CRESTOR) 5 MG tablet Take 1 tablet (5 mg total) by mouth daily. 06/10/23   Dettinger, Elige Radon, MD  SALINE NASAL MIST NA Place 1 spray into the nose 2 (two) times daily.    [provider]    Physical Exam: Vitals:   09/02/23 0115 09/02/23 0335 09/02/23 0445 09/02/23 0453  BP: 123/69  125/73   Pulse: (!) 112  96 99  Resp: 18   19  Temp: 98 F (36.7 C) 98.7 F (37.1 C)    TempSrc:  Rectal    SpO2:  91%  94% 93%  Weight: 56.7 kg     Height: 5\' 4"  (1.626 m)      GENERAL:  A&O x 1, NAD, well developed, cooperative, follows commands HEENT: [place]/AT, No thrush, No icterus, No oral ulcers Neck:  No neck mass, No meningismus, soft, supple CV: RRR, no S3, no S4, no rub, no JVD Lungs: Bibasilar rales.  No wheezing.  Good air movement Abd: soft/NT +BS, nondistended Ext: trace LE edema, no lymphangitis, no cyanosis, no rashes Neuro:  CN II-XII intact, strength 4/5 in RUE, RLE, strength 4/5 LUE, LLE; sensation intact bilateral; no dysmetria; babinski equivocal  Data Reviewed: Data reviewed above in history  Assessment and Plan: Sepsis -Present  on admission -Presented with tachycardia and leukocytosis -Due to UTI and pneumonia -Lactic acid 1.8 -UA>50 WBC -Chest x-ray with upper lobe opacities  Lobar pneumonia -Continue ceftriaxone and azithromycin -COVID-19/RSV/flu--neg  Pyuria -Concerning for UTI -UA>50 WBC  Acute metabolic encephalopathy -Secondary to infectious process -Ammonia 19 -Further workup if no improvement -CT brain negative  Anxiety -Continue home dose Ativan with as needed dosing for agitation -PDMP reviewed -1 mg, #40, last refill 08/13/2023  GERD -Continue PPI -Continue ceftriaxone pending culture data    Advance Care Planning: FULL  Consults: none  Family Communication: daughter 2/19  Severity of Illness: The appropriate patient status for this patient is INPATIENT. Inpatient status is judged to be reasonable and necessary in order to provide the required intensity of service to ensure the patient's safety. The patient's presenting symptoms, physical exam findings, and initial radiographic and laboratory data in the context of their chronic comorbidities is felt to place them at high risk for further clinical deterioration. Furthermore, it is not anticipated that the patient will be medically stable for discharge from the hospital within 2 midnights of  admission.   * I certify that at the point of admission it is my clinical judgment that the patient will require inpatient hospital care spanning beyond 2 midnights from the point of admission due to high intensity of service, high risk for further deterioration and high frequency of surveillance required.*  Author: Catarina Hartshorn, MD 09/02/2023 7:36 AM  For on call review [email or web address].

## 2023-09-02 NOTE — Hospital Course (Addendum)
 89 year old female with a history of cognitive impairment, coronary artery disease, anxiety, GERD, hyperlipidemia, asthma presenting with generalized weakness and confusion for 2 days.  History is obtained from the patient's daughter.  Patient is unable provide any history secondary to her acute encephalopathy.  At baseline, the patient ambulates with a walker.  Daughter states that the patient has " a touch of dementia".  Daughter helps assist the patient with bathing and clothing.  Daughter has noted that the patient has had decreased oral intake with confusion and agitation for last 2 days.  There is no reports of vomiting, diarrhea, abdominal pain, hemoptysis, fever, chills. In the ED, the patient was afebrile hemodynamically stable with oxygen saturation 94% room air.  WBC 12.1, hemoglobin 13.0, platelets 157.  Sodium 130, potassium 3.9, bicarbonate 21, serum creatinine 0.9.  LFTs were unremarkable.  Ammonia 19.  COVID-19 PCR is negative.  Lactic acid 1.8.  UA showed>50 WBC.  Chest x-ray showed upper lobe opacities.  CT brain was negative for acute findings.  Patient was started on ceftriaxone and azithromycin.  She was admitted for further evaluation treatment for confusion. Her mental status gradually improved and returned to near baseline.  She remained pleasantly confused.  Her baseline home ativan was restarted which pt tolerated well.  PT recommended SNF with which the family agreed.

## 2023-09-02 NOTE — ED Notes (Signed)
Pt appears to be resting more comfortably at this time.

## 2023-09-03 ENCOUNTER — Inpatient Hospital Stay (HOSPITAL_COMMUNITY): Payer: Medicare HMO

## 2023-09-03 DIAGNOSIS — N3000 Acute cystitis without hematuria: Secondary | ICD-10-CM | POA: Diagnosis not present

## 2023-09-03 DIAGNOSIS — A4150 Gram-negative sepsis, unspecified: Secondary | ICD-10-CM | POA: Diagnosis not present

## 2023-09-03 DIAGNOSIS — J181 Lobar pneumonia, unspecified organism: Secondary | ICD-10-CM | POA: Diagnosis not present

## 2023-09-03 DIAGNOSIS — G9341 Metabolic encephalopathy: Secondary | ICD-10-CM | POA: Diagnosis not present

## 2023-09-03 DIAGNOSIS — A419 Sepsis, unspecified organism: Secondary | ICD-10-CM

## 2023-09-03 LAB — CBC
HCT: 32.8 % — ABNORMAL LOW (ref 36.0–46.0)
Hemoglobin: 11.2 g/dL — ABNORMAL LOW (ref 12.0–15.0)
MCH: 32.3 pg (ref 26.0–34.0)
MCHC: 34.1 g/dL (ref 30.0–36.0)
MCV: 94.5 fL (ref 80.0–100.0)
Platelets: 148 10*3/uL — ABNORMAL LOW (ref 150–400)
RBC: 3.47 MIL/uL — ABNORMAL LOW (ref 3.87–5.11)
RDW: 12.6 % (ref 11.5–15.5)
WBC: 8.5 10*3/uL (ref 4.0–10.5)
nRBC: 0 % (ref 0.0–0.2)

## 2023-09-03 LAB — BASIC METABOLIC PANEL
Anion gap: 13 (ref 5–15)
BUN: 8 mg/dL (ref 8–23)
CO2: 22 mmol/L (ref 22–32)
Calcium: 8.4 mg/dL — ABNORMAL LOW (ref 8.9–10.3)
Chloride: 105 mmol/L (ref 98–111)
Creatinine, Ser: 0.7 mg/dL (ref 0.44–1.00)
GFR, Estimated: >60 mL/min (ref >60)
Glucose, Bld: 91 mg/dL (ref 70–99)
Potassium: 3.1 mmol/L — ABNORMAL LOW (ref 3.5–5.1)
Sodium: 140 mmol/L (ref 135–145)

## 2023-09-03 LAB — PROTIME-INR
INR: 1.1 (ref 0.8–1.2)
Prothrombin Time: 14.6 s (ref 11.4–15.2)

## 2023-09-03 LAB — VITAMIN B12: Vitamin B-12: 101 pg/mL — ABNORMAL LOW (ref 180–914)

## 2023-09-03 LAB — TSH: TSH: 1.522 u[IU]/mL (ref 0.350–4.500)

## 2023-09-03 LAB — FOLATE: Folate: 13 ng/mL (ref >5.9)

## 2023-09-03 LAB — T4, FREE: Free T4: 0.81 ng/dL (ref 0.61–1.12)

## 2023-09-03 LAB — CORTISOL-AM, BLOOD: Cortisol - AM: 32.6 ug/dL — ABNORMAL HIGH (ref 6.7–22.6)

## 2023-09-03 MED ORDER — VITAMIN B-12 1000 MCG PO TABS
500.0000 ug | ORAL_TABLET | Freq: Every day | ORAL | Status: DC
  Administered 2023-09-04 – 2023-09-09 (×6): 500 ug via ORAL
  Filled 2023-09-03: qty 5
  Filled 2023-09-03: qty 1
  Filled 2023-09-03 (×2): qty 5
  Filled 2023-09-03: qty 1
  Filled 2023-09-03: qty 5

## 2023-09-03 MED ORDER — POTASSIUM CHLORIDE 10 MEQ/100ML IV SOLN
10.0000 meq | INTRAVENOUS | Status: AC
Start: 2023-09-03 — End: 2023-09-03
  Administered 2023-09-03 (×4): 10 meq via INTRAVENOUS
  Filled 2023-09-03 (×3): qty 100

## 2023-09-03 MED ORDER — LORAZEPAM 2 MG/ML IJ SOLN
0.5000 mg | Freq: Once | INTRAMUSCULAR | Status: AC
  Administered 2023-09-03: 0.5 mg via INTRAVENOUS
  Filled 2023-09-03: qty 1

## 2023-09-03 MED ORDER — CYANOCOBALAMIN 1000 MCG/ML IJ SOLN
1000.0000 ug | Freq: Once | INTRAMUSCULAR | Status: AC
  Administered 2023-09-03: 1000 ug via INTRAMUSCULAR
  Filled 2023-09-03: qty 1

## 2023-09-03 MED ORDER — POTASSIUM CHLORIDE IN NACL 20-0.9 MEQ/L-% IV SOLN: INTRAVENOUS | Status: AC

## 2023-09-03 NOTE — Progress Notes (Signed)
   09/03/23 1136  TOC Brief Assessment  Insurance and Status Reviewed  Patient has primary care physician Yes  Home environment has been reviewed Home  Prior level of function: Famiy assist  Prior/Current Home Services No current home services  Social Drivers of Health Review SDOH reviewed no interventions necessary  Readmission risk has been reviewed Yes  Transition of care needs no transition of care needs at this time   Transition of Care Department [facility]) has reviewed patient and no TOC needs have been identified at this time. We will continue to monitor patient advancement through interdisciplinary progression rounds. If new patient transition needs arise, please place a TOC consult.

## 2023-09-03 NOTE — ED Notes (Signed)
 Pt awake, agitated and pulling at mitts.

## 2023-09-03 NOTE — Progress Notes (Signed)
 Patient with poor oral intake this shift, arouses but goes right back to sleep. She will answer some questions by shaking her head yes or no.

## 2023-09-03 NOTE — ED Notes (Signed)
 Pt continues to yell and be agitated. Pt attempting to remove mitts.

## 2023-09-03 NOTE — Progress Notes (Signed)
 PROGRESS NOTE  Mikayla Wilson WJX:[id number] DOB: [birth year] DOA: 09/02/2023 PCP: Dettinger, Elige Radon, MD  Brief History:  89 year old female with a history of cognitive impairment, coronary artery disease, anxiety, GERD, hyperlipidemia, asthma presenting with generalized weakness and confusion for 2 days.  History is obtained from the patient's daughter.  Patient is unable provide any history secondary to her acute encephalopathy.  At baseline, the patient ambulates with a walker.  Daughter states that the patient has " a touch of dementia".  Daughter helps assist the patient with bathing and clothing.  Daughter has noted that the patient has had decreased oral intake with confusion and agitation for last 2 days.  There is no reports of vomiting, diarrhea, abdominal pain, hemoptysis, fever, chills. In the ED, the patient was afebrile hemodynamically stable with oxygen saturation 94% room air.  WBC 12.1, hemoglobin 13.0, platelets 157.  Sodium 130, potassium 3.9, bicarbonate 21, serum creatinine 0.9.  LFTs were unremarkable.  Ammonia 19.  COVID-19 PCR is negative.  Lactic acid 1.8.  UA showed>50 WBC.  Chest x-ray showed upper lobe opacities.  CT brain was negative for acute findings.  Patient was started on ceftriaxone and azithromycin.  She was admitted for further evaluation treatment for confusion.   Assessment/Plan: Sepsis -Present on admission -Presented with tachycardia and leukocytosis -Due to UTI and pneumonia -Lactic acid 1.8 -UA>50 WBC -Chest x-ray with upper lobe opacities   Lobar pneumonia -Continue ceftriaxone and azithromycin -COVID-19/RSV/flu--neg -viral respiratory panel neg  Acute respiratory failure with hypoxia -had oxygen saturation 89% on RA -stable on 1.5L -wean to RA for saturation >92%   Pyuria -Concerning for UTI -UA>50 WBC -continue ceftriaxone pending culture data   Acute metabolic encephalopathy -Secondary to infectious process -Ammonia  19 -remains agitated and confused on 2/20 -CT brain negative -B12 -folate -TSH -MR brain -ativan prn agitation   Anxiety -Continue home dose Ativan with as needed dosing for agitation -PDMP reviewed -1 mg, #40, last refill 08/13/2023   GERD -Continue PPI        Family Communication:   daughter updated 2/20  Consultants:  none  Code Status:  FULL   DVT Prophylaxis:  [place] Lovenox   Procedures: As Listed in Progress Note Above  Antibiotics: Ceftriaxone 2/19>> Azithro 2/19>      Subjective: Pt remains confused and agitated.  ROS not possible due to acute encephalopathy  Objective: Vitals:   09/03/23 0400 09/03/23 0500 09/03/23 0513 09/03/23 0904  BP: 135/65 129/87 129/87 123/68  Pulse: 88 83 83 83  Resp: (!) 21 (!) 25 14 20   Temp:   98 F (36.7 C) 98.2 F (36.8 C)  TempSrc:   Axillary Axillary  SpO2: 90% 90% 91% 100%  Weight:      Height:        Intake/Output Summary (Last 24 hours) at 09/03/2023 1003 Last data filed at 09/03/2023 0449 Gross per 24 hour  Intake 1100.4 ml  Output 800 ml  Net 300.4 ml   Weight change:  Exam:  General:  Pt is alert, follows commands appropriately, not in acute distress HEENT: No icterus, No thrush, No neck mass, [place]/AT Cardiovascular: RRR, S1/S2, no rubs, no gallops Respiratory: bibasilar rales.  No wheeze Abdomen: Soft/+BS, non tender, non distended, no guarding Extremities: No edema, No lymphangitis, No petechiae, No rashes, no synovitis   Data Reviewed: I have personally reviewed following labs and imaging studies Basic Metabolic Panel: Recent Labs  Lab 09/02/23 0209  09/03/23 0501  NA 138 140  K 3.9 3.1*  CL 103 105  CO2 21* 22  GLUCOSE 140* 91  BUN 18 8  CREATININE 0.89 0.70  CALCIUM 9.1 8.4*   Liver Function Tests: Recent Labs  Lab 09/02/23 0209  AST 22  ALT 16  ALKPHOS 84  BILITOT 1.3*  PROT 7.4  ALBUMIN 3.7   No results for input(s): "LIPASE", "AMYLASE" in the last 168 hours. Recent  Labs  Lab 09/02/23 0210  AMMONIA 19   Coagulation Profile: Recent Labs  Lab 09/03/23 0501  INR 1.1   CBC: Recent Labs  Lab 09/02/23 0209 09/03/23 0501  WBC 12.1* 8.5  NEUTROABS 9.4*  --   HGB 13.0 11.2*  HCT 39.8 32.8*  MCV 94.3 94.5  PLT 157 148*   Cardiac Enzymes: No results for input(s): "CKTOTAL", "CKMB", "CKMBINDEX", "TROPONINI" in the last 168 hours. BNP: Invalid input(s): "POCBNP" CBG: No results for input(s): "GLUCAP" in the last 168 hours. HbA1C: No results for input(s): "HGBA1C" in the last 72 hours. Urine analysis:    Component Value Date/Time   COLORURINE YELLOW 09/02/2023 0209   APPEARANCEUR TURBID (A) 09/02/2023 0209   APPEARANCEUR Clear 09/19/2020 1017   LABSPEC 1.020 09/02/2023 0209   PHURINE 5.0 09/02/2023 0209   GLUCOSEU NEGATIVE 09/02/2023 0209   HGBUR SMALL (A) 09/02/2023 0209   BILIRUBINUR NEGATIVE 09/02/2023 0209   BILIRUBINUR Negative 09/19/2020 1017   KETONESUR 5 (A) 09/02/2023 0209   PROTEINUR 100 (A) 09/02/2023 0209   UROBILINOGEN 0.2 01/25/2015 2330   NITRITE NEGATIVE 09/02/2023 0209   LEUKOCYTESUR MODERATE (A) 09/02/2023 0209   Sepsis Labs: @LABRCNTIP (procalcitonin:4,lacticidven:4) ) Recent Results (from the past 240 hours)  Blood Culture (routine x 2)     Status: None (Preliminary result)   Collection Time: 09/02/23  2:09 AM   Specimen: BLOOD  Result Value Ref Range Status   Specimen Description BLOOD BLOOD LEFT FOREARM  Final   Special Requests   Final    BOTTLES DRAWN AEROBIC AND ANAEROBIC Blood Culture adequate volume   Culture   Final    NO GROWTH 1 DAY Performed at [facility], [address] [place], [place] [address]    Report Status PENDING  Incomplete  Resp panel by RT-PCR (RSV, Flu A&B, Covid) Anterior Nasal Swab     Status: None   Collection Time: 09/02/23  2:09 AM   Specimen: Anterior Nasal Swab  Result Value Ref Range Status   SARS Coronavirus 2 by RT PCR NEGATIVE NEGATIVE Final    Comment:  (NOTE) SARS-CoV-2 target nucleic acids are NOT DETECTED.  The SARS-CoV-2 RNA is generally detectable in upper respiratory specimens during the acute phase of infection. The lowest concentration of SARS-CoV-2 viral copies this assay can detect is 138 copies/mL. A negative result does not preclude SARS-Cov-2 infection and should not be used as the sole basis for treatment or other patient management decisions. A negative result may occur with  improper specimen collection/handling, submission of specimen other than nasopharyngeal swab, presence of viral mutation(s) within the areas targeted by this assay, and inadequate number of viral copies(<138 copies/mL). A negative result must be combined with clinical observations, patient history, and epidemiological information. The expected result is Negative.  Fact Sheet for Patients:  BloggerCourse.com  Fact Sheet for Healthcare Providers:  SeriousBroker.it  This test is no t yet approved or cleared by the [place] FDA and  has been authorized for detection and/or diagnosis of SARS-CoV-2 by FDA under an Emergency Use  Authorization (EUA). This EUA will remain  in effect (meaning this test can be used) for the duration of the COVID-19 declaration under Section 564(b)(1) of the Act, 21 U.S.C.section 360bbb-3(b)(1), unless the authorization is terminated  or revoked sooner.       Influenza A by PCR NEGATIVE NEGATIVE Final   Influenza B by PCR NEGATIVE NEGATIVE Final    Comment: (NOTE) The Xpert Xpress SARS-CoV-2/FLU/RSV plus assay is intended as an aid in the diagnosis of influenza from Nasopharyngeal swab specimens and should not be used as a sole basis for treatment. Nasal washings and aspirates are unacceptable for Xpert Xpress SARS-CoV-2/FLU/RSV testing.  Fact Sheet for Patients: BloggerCourse.com  Fact Sheet for Healthcare  Providers: SeriousBroker.it  This test is not yet approved or cleared by the [place] FDA and has been authorized for detection and/or diagnosis of SARS-CoV-2 by FDA under an Emergency Use Authorization (EUA). This EUA will remain in effect (meaning this test can be used) for the duration of the COVID-19 declaration under Section 564(b)(1) of the Act, 21 U.S.C. section 360bbb-3(b)(1), unless the authorization is terminated or revoked.     Resp Syncytial Virus by PCR NEGATIVE NEGATIVE Final    Comment: (NOTE) Fact Sheet for Patients: BloggerCourse.com  Fact Sheet for Healthcare Providers: SeriousBroker.it  This test is not yet approved or cleared by the [place] FDA and has been authorized for detection and/or diagnosis of SARS-CoV-2 by FDA under an Emergency Use Authorization (EUA). This EUA will remain in effect (meaning this test can be used) for the duration of the COVID-19 declaration under Section 564(b)(1) of the Act, 21 U.S.C. section 360bbb-3(b)(1), unless the authorization is terminated or revoked.  Performed at [facility], [address], [place], [place] [address]   Blood Culture (routine x 2)     Status: None (Preliminary result)   Collection Time: 09/02/23  2:14 AM   Specimen: BLOOD  Result Value Ref Range Status   Specimen Description BLOOD BLOOD LEFT HAND  Final   Special Requests   Final    BOTTLES DRAWN AEROBIC ONLY Blood Culture results may not be optimal due to an inadequate volume of blood received in culture bottles   Culture   Final    NO GROWTH 1 DAY Performed at [facility], [address], [place], [place] [address]    Report Status PENDING  Incomplete  Respiratory (~20 pathogens) panel by PCR     Status: None   Collection Time: 09/02/23  6:20 PM   Specimen: Nasopharyngeal Swab; Respiratory  Result Value Ref Range Status   Adenovirus NOT DETECTED NOT DETECTED  Final   Coronavirus 229E NOT DETECTED NOT DETECTED Final    Comment: (NOTE) The Coronavirus on the Respiratory Panel, DOES NOT test for the novel  Coronavirus (2019 nCoV)    Coronavirus HKU1 NOT DETECTED NOT DETECTED Final   Coronavirus NL63 NOT DETECTED NOT DETECTED Final   Coronavirus OC43 NOT DETECTED NOT DETECTED Final   Metapneumovirus NOT DETECTED NOT DETECTED Final   Rhinovirus / Enterovirus NOT DETECTED NOT DETECTED Final   Influenza A NOT DETECTED NOT DETECTED Final   Influenza B NOT DETECTED NOT DETECTED Final   Parainfluenza Virus 1 NOT DETECTED NOT DETECTED Final   Parainfluenza Virus 2 NOT DETECTED NOT DETECTED Final   Parainfluenza Virus 3 NOT DETECTED NOT DETECTED Final   Parainfluenza Virus 4 NOT DETECTED NOT DETECTED Final   Respiratory Syncytial Virus NOT DETECTED NOT DETECTED Final   Bordetella pertussis NOT DETECTED NOT  DETECTED Final   Bordetella Parapertussis NOT DETECTED NOT DETECTED Final   Chlamydophila pneumoniae NOT DETECTED NOT DETECTED Final   Mycoplasma pneumoniae NOT DETECTED NOT DETECTED Final    Comment: Performed at [facility], [address] [place], [place] [address]     Scheduled Meds:  enoxaparin (LOVENOX) injection  30 mg Subcutaneous Q24H   pantoprazole (PROTONIX) IV  40 mg Intravenous Q24H   Continuous Infusions:  0.9 % NaCl with KCl 20 mEq / L 50 mL/hr at 09/03/23 6045   azithromycin 500 mg (09/03/23 0511)   cefTRIAXone (ROCEPHIN)  IV Stopped (09/02/23 2213)   potassium chloride 10 mEq (09/03/23 0930)    Procedures/Studies: CT Head Wo Contrast Result Date: 09/02/2023 CLINICAL DATA:  Delirium EXAM: CT HEAD WITHOUT CONTRAST TECHNIQUE: Contiguous axial images were obtained from the base of the skull through the vertex without intravenous contrast. RADIATION DOSE REDUCTION: This exam was performed according to the departmental dose-optimization program which includes automated exposure control, adjustment of the mA and/or kV  according to patient size and/or use of iterative reconstruction technique. COMPARISON:  None Available. FINDINGS: Brain: No evidence of acute infarction, hemorrhage, hydrocephalus, extra-axial collection or mass lesion/mass effect. Cerebral atrophy. Partially empty sella. Vascular: No hyperdense vessel or unexpected calcification. Skull: No acute fracture. Sinuses/Orbits: No acute finding. Other: No mastoid effusions. IMPRESSION: No evidence of acute intracranial abnormality. Electronically Signed   By: Feliberto Harts M.D.   On: 09/02/2023 03:49   DG Chest Port 1 View Result Date: 09/02/2023 CLINICAL DATA:  Sepsis EXAM: PORTABLE CHEST 1 VIEW COMPARISON:  08/18/2017 FINDINGS: The lungs are symmetrically well expanded. Reticulonodular infiltrates have developed asymmetrically within the upper lung zones bilaterally, more severe within right upper lobe, likely reflecting changes of atypical infection, though hypersensitivity pneumonitis or smoking related lung disease could appear similarly in the chronic setting. No pneumothorax or pleural effusion. Cardiac size within limits. Pulmonary vascularity is normal. No acute bone abnormality., IMPRESSION: 1. Reticulonodular infiltrates within the upper lung zones bilaterally, more severe within right upper lobe, likely reflecting changes of atypical infection in the acute setting. Electronically Signed   By: Helyn Numbers M.D.   On: 09/02/2023 03:19    Catarina Hartshorn, DO  [facility]  If 7PM-7AM, please contact night-coverage [email or web address] Password [email or web address] 09/03/2023, 10:03 AM   LOS: 1 day

## 2023-09-03 NOTE — Therapy (Signed)
 Arrived for PT evaluation.  Patient sleeping and wearing mitts.  PT attempts to rouse patient.  She briefly rouses and is agitated with PT attempts and uses her arms to express her unwillingness to participate with PT.  Will attempt again when appropriate.   11:14 AM, 09/03/23 Ivah Girardot Small Malerie Eakins MPT [facility] [place] [phone]

## 2023-09-03 NOTE — ED Notes (Signed)
 Pt continues to pull at mitts and try and remove equipment.

## 2023-09-04 ENCOUNTER — Inpatient Hospital Stay (HOSPITAL_COMMUNITY): Payer: Medicare HMO

## 2023-09-04 DIAGNOSIS — N3000 Acute cystitis without hematuria: Secondary | ICD-10-CM | POA: Diagnosis not present

## 2023-09-04 DIAGNOSIS — A4150 Gram-negative sepsis, unspecified: Secondary | ICD-10-CM | POA: Diagnosis not present

## 2023-09-04 DIAGNOSIS — G9341 Metabolic encephalopathy: Secondary | ICD-10-CM | POA: Diagnosis not present

## 2023-09-04 DIAGNOSIS — J181 Lobar pneumonia, unspecified organism: Secondary | ICD-10-CM | POA: Diagnosis not present

## 2023-09-04 LAB — BASIC METABOLIC PANEL
Anion gap: 11 (ref 5–15)
BUN: 5 mg/dL — ABNORMAL LOW (ref 8–23)
CO2: 22 mmol/L (ref 22–32)
Calcium: 8.4 mg/dL — ABNORMAL LOW (ref 8.9–10.3)
Chloride: 106 mmol/L (ref 98–111)
Creatinine, Ser: 0.66 mg/dL (ref 0.44–1.00)
GFR, Estimated: >60 mL/min (ref >60)
Glucose, Bld: 58 mg/dL — ABNORMAL LOW (ref 70–99)
Potassium: 3.5 mmol/L (ref 3.5–5.1)
Sodium: 139 mmol/L (ref 135–145)

## 2023-09-04 LAB — MAGNESIUM: Magnesium: 2 mg/dL (ref 1.7–2.4)

## 2023-09-04 LAB — URINE CULTURE: Culture: >=100000 — AB

## 2023-09-04 MED ORDER — LORAZEPAM 0.5 MG PO TABS
0.2500 mg | ORAL_TABLET | Freq: Every morning | ORAL | Status: DC
  Administered 2023-09-05: 0.25 mg via ORAL
  Filled 2023-09-04: qty 1

## 2023-09-04 MED ORDER — CHLORHEXIDINE GLUCONATE CLOTH 2 % EX PADS
6.0000 | MEDICATED_PAD | Freq: Every day | CUTANEOUS | Status: DC
  Administered 2023-09-04 – 2023-09-09 (×6): 6 via TOPICAL

## 2023-09-04 MED ORDER — ENOXAPARIN SODIUM 40 MG/0.4ML IJ SOSY
40.0000 mg | PREFILLED_SYRINGE | INTRAMUSCULAR | Status: DC
  Administered 2023-09-05 – 2023-09-09 (×5): 40 mg via SUBCUTANEOUS
  Filled 2023-09-04 (×5): qty 0.4

## 2023-09-04 MED ORDER — ENSURE ENLIVE PO LIQD
237.0000 mL | Freq: Two times a day (BID) | ORAL | Status: DC
  Administered 2023-09-05 – 2023-09-09 (×8): 237 mL via ORAL

## 2023-09-04 NOTE — Progress Notes (Signed)
 Patient ID: Mikayla Wilson, female   DOB: 1931-02-01, 89 y.o.   MRN: [id number]  Patient asked to get back in bed. RN and NT moved chair beside of bed to stand pivot to bed. Patient screaming and crying upon movement. Was able to to get patient back to bed, only having severe pain upon movement. MD aware. No new orders received.   Lidia Collum, RN BSN BC-CVN

## 2023-09-04 NOTE — Plan of Care (Signed)
  Problem: Acute Rehab PT Goals(only PT should resolve) Goal: Pt Will Go Supine/Side To Sit Outcome: Progressing Flowsheets (Taken 09/04/2023 1216) Pt will go Supine/Side to Sit:  with minimal assist  with moderate assist Goal: Patient Will Transfer Sit To/From Stand Outcome: Progressing Flowsheets (Taken 09/04/2023 1216) Patient will transfer sit to/from stand:  with minimal assist  with moderate assist Goal: Pt Will Transfer Bed To Chair/Chair To Bed Outcome: Progressing Flowsheets (Taken 09/04/2023 1216) Pt will Transfer Bed to Chair/Chair to Bed:  with min assist  with mod assist Goal: Pt Will Ambulate Outcome: Progressing Flowsheets (Taken 09/04/2023 1216) Pt will Ambulate:  25 feet  with minimal assist  with moderate assist  with rolling walker   12:16 PM, 09/04/23 Ocie Bob, MPT Physical Therapist with [facility] [phone] office [phone] mobile phone

## 2023-09-04 NOTE — TOC Initial Note (Signed)
 Transition of Care [facility]) - Initial/Assessment Note    Patient Details  Name: Mikayla Wilson MRN: [id number] Date of Birth: 1931-07-08  [facility] CM/SW Contact:    Karn Cassis, LCSW Phone Number: 09/04/2023, 10:52 AM  Clinical Narrative:  Pt admitted due to sepsis. Assessment completed with pt's daughter who reports she lives with pt. At baseline, pt is fairly independent. PT evaluated pt and recommend SNF. Discussed placement process and SNF authorization. Daughter reports pt has been to [facility] in the past and requests pt go there if able. TOC will initiate bed search and SNF auth.                  Expected Discharge Plan: Skilled Nursing Facility Barriers to Discharge: Continued Medical Work up   Patient Goals and CMS Choice Patient states their goals for this hospitalization and ongoing recovery are:: rehab   Choice offered to / list presented to : Adult Children [place] ownership interest in [facility].provided to::  (n/a- Want [facility])    Expected Discharge Plan and Services     Post Acute Care Choice: Skilled Nursing Facility Living arrangements for the past 2 months: Single Family Home                                      Prior Living Arrangements/Services Living arrangements for the past 2 months: Single Family Home Lives with:: Adult Children Patient language and need for interpreter reviewed:: Yes Do you feel safe going back to the place where you live?: Yes      Need for Family Participation in Patient Care: Yes (Comment)   Current home services: DME (walker, cane) Criminal Activity/Legal Involvement Pertinent to Current Situation/Hospitalization: No - Comment as needed  Activities of Daily Living      Permission Sought/Granted                  Emotional Assessment   Attitude/Demeanor/Rapport: Unable to Assess Affect (typically observed): Unable to Assess   Alcohol / Substance Use: Not  Applicable Psych Involvement: No (comment)  Admission diagnosis:  Delirium [R41.0] Acute cystitis without hematuria [N30.00] Community acquired pneumonia, unspecified laterality [J18.9] Sepsis due to gram-negative UTI (HCC) [A41.50, N39.0] Patient Active Problem List   Diagnosis Date Noted   Sepsis due to gram-negative UTI (HCC) 09/02/2023   Sepsis due to undetermined organism (HCC) 09/02/2023   Lobar pneumonia (HCC) 09/02/2023   Acute metabolic encephalopathy 09/02/2023   Acute cystitis without hematuria 09/02/2023   Hypokalemia 08/18/2016   Smoker 12/03/2015   Hip fracture requiring operative repair (HCC) 11/30/2015   Asthma 09/24/2015   HLD (hyperlipidemia) 08/27/2015   GERD (gastroesophageal reflux disease) 08/27/2015   Anxiety 01/26/2015   PCP:  Dettinger, Elige Radon, MD Pharmacy:   [facility] [address] [place] - [address] [place] [address] Phone: [phone] Fax: [phone]     Social Drivers of Health (SDOH) Social History: SDOH Screenings   Food Insecurity: Patient Unable To Answer (09/03/2023)  Housing: Patient Unable To Answer (09/03/2023)  Transportation Needs: Patient Unable To Answer (09/03/2023)  Utilities: Patient Unable To Answer (09/03/2023)  Depression (PHQ2-9): Low Risk  (03/09/2023)  Social Connections: Unknown (09/03/2023)  Tobacco Use: High Risk (09/02/2023)   SDOH Interventions: Food Insecurity Interventions: Intervention Not Indicated Housing Interventions: Patient Unable to Answer Transportation Interventions: Patient Unable to Answer  Utilities Interventions: Patient Unable to Answer Social Connections Interventions: Patient Unable to Answer   Readmission Risk Interventions     No data to display

## 2023-09-04 NOTE — NC FL2 (Signed)
 [place] MEDICAID FL2 LEVEL OF CARE FORM     IDENTIFICATION  Patient Name: Mikayla Wilson Birthdate: Jul 08, 1931 Sex: female Admission Date (Current Location): 09/02/2023  [facility] and [place] Number:  [employer] and Address:  [facility],  [address], [place] [address]      Provider Number: [id number]  Attending Physician Name and Address:  Catarina Hartshorn, MD  Relative Name and Phone Number:       Current Level of Care: Hospital Recommended Level of Care: Skilled Nursing Facility Prior Approval Number:    Date Approved/Denied:   PASRR Number: [id number]  Discharge Plan: SNF    Current Diagnoses: Patient Active Problem List   Diagnosis Date Noted   Sepsis due to gram-negative UTI (HCC) 09/02/2023   Sepsis due to undetermined organism (HCC) 09/02/2023   Lobar pneumonia (HCC) 09/02/2023   Acute metabolic encephalopathy 09/02/2023   Acute cystitis without hematuria 09/02/2023   Hypokalemia 08/18/2016   Smoker 12/03/2015   Hip fracture requiring operative repair (HCC) 11/30/2015   Asthma 09/24/2015   HLD (hyperlipidemia) 08/27/2015   GERD (gastroesophageal reflux disease) 08/27/2015   Anxiety 01/26/2015    Orientation RESPIRATION BLADDER Height & Weight     Self, Place  O2 (2.5L) Indwelling catheter Weight: 125 lb (56.7 kg) Height:  5\' 4"  (162.6 cm)  BEHAVIORAL SYMPTOMS/MOOD NEUROLOGICAL BOWEL NUTRITION STATUS      Incontinent Diet (See d/c summary)  AMBULATORY STATUS COMMUNICATION OF NEEDS Skin   Extensive Assist Verbally Bruising, Other (Comment) (Rash to bilateral arms)                       Personal Care Assistance Level of Assistance  Bathing, Feeding, Dressing Bathing Assistance: Maximum assistance Feeding assistance: Limited assistance Dressing Assistance: Maximum assistance     Functional Limitations Info  Sight, Hearing, Speech Sight Info: Impaired Hearing Info: Adequate Speech Info: Adequate    SPECIAL CARE  FACTORS FREQUENCY  PT (By licensed PT)     PT Frequency: 5x weekly              Contractures      Additional Factors Info  Code Status, Allergies, Psychotropic Code Status Info: Full code Allergies Info: Aspirin, Morphine and Codeine Psychotropic Info: Lexapro, Ativan         Current Medications (09/04/2023):  This is the current hospital active medication list Current Facility-Administered Medications  Medication Dose Route Frequency Provider Last Rate Last Admin   acetaminophen (TYLENOL) tablet 650 mg  650 mg Oral Q6H PRN Mansy, Jan A, MD   650 mg at 09/04/23 1026   Or   acetaminophen (TYLENOL) suppository 650 mg  650 mg Rectal Q6H PRN Mansy, Jan A, MD       azithromycin (ZITHROMAX) 500 mg in sodium chloride 0.9 % 250 mL IVPB  500 mg Intravenous Q24H Tat, Onalee Hua, MD 250 mL/hr at 09/04/23 0514 500 mg at 09/04/23 0514   cefTRIAXone (ROCEPHIN) 2 g in sodium chloride 0.9 % 100 mL IVPB  2 g Intravenous Q24H Catarina Hartshorn, MD   Stopped at 09/03/23 2257   Chlorhexidine Gluconate Cloth 2 % PADS 6 each  6 each Topical Q0600 Tat, David, MD   6 each at 09/04/23 1013   chlorpheniramine-HYDROcodone (TUSSIONEX) 10-8 MG/5ML suspension 5 mL  5 mL Oral Q12H PRN Mansy, Jan A, MD       enoxaparin (LOVENOX) injection 30 mg  30 mg Subcutaneous Q24H Tat, Onalee Hua, MD   30 mg  at 09/04/23 1013   ipratropium-albuterol (DUONEB) 0.5-2.5 (3) MG/3ML nebulizer solution 3 mL  3 mL Nebulization Q6H PRN Mansy, Jan A, MD       LORazepam (ATIVAN) injection 0.5 mg  0.5 mg Intravenous Q6H PRN Tat, Onalee Hua, MD   0.5 mg at 09/03/23 0420   magnesium hydroxide (MILK OF MAGNESIA) suspension 30 mL  30 mL Oral Daily PRN Mansy, Jan A, MD       ondansetron [facility]) tablet 4 mg  4 mg Oral Q6H PRN Mansy, Jan A, MD       Or   ondansetron [facility]) injection 4 mg  4 mg Intravenous Q6H PRN Mansy, Jan A, MD       pantoprazole (PROTONIX) injection 40 mg  40 mg Intravenous Q24H Tat, Onalee Hua, MD   40 mg at 09/03/23 1649   traZODone (DESYREL)  tablet 25 mg  25 mg Oral QHS PRN Mansy, Jan A, MD       vitamin B-12 (CYANOCOBALAMIN) tablet 500 mcg  500 mcg Oral Daily Tat, David, MD   500 mcg at 09/04/23 1013     Discharge Medications: Please see discharge summary for a list of discharge medications.  Relevant Imaging Results:  Relevant Lab Results:   Additional Information SSN: [id number]  Karn Cassis, LCSW

## 2023-09-04 NOTE — Evaluation (Signed)
 Physical Therapy Evaluation Patient Details Name: Mikayla Wilson MRN: [id number] DOB: 10/21/1930 Today's Date: 09/04/2023  History of Present Illness  Mikayla Wilson is a 89 year old female with a history of cognitive impairment, coronary artery disease, anxiety, GERD, hyperlipidemia, asthma presenting with generalized weakness and confusion for 2 days.  History is obtained from the patient's daughter.  Patient is unable provide any history secondary to her acute encephalopathy.  At baseline, the patient ambulates with a walker.  Daughter states that the patient has " a touch of dementia".  Daughter helps assist the patient with bathing and clothing.  Daughter has noted that the patient has had decreased oral intake with confusion and agitation for last 2 days.  There is no reports of vomiting, diarrhea, abdominal pain, hemoptysis, fever, chills.   Clinical Impression  Patient demonstrates slow labored movement for sitting up at bedside, once seated had frequent posterior leaning, but able to maintain sitting balance after verbal/tactile cueing, very unsteady on feet and limited to a few side steps before having to sit due to fall risk. Patient tolerated sitting up in chair after therapy with family members present. Patient will benefit from continued skilled physical therapy in hospital and recommended venue below to increase strength, balance, endurance for safe ADLs and gait.           If plan is discharge home, recommend the following: A lot of help with bathing/dressing/bathroom;A lot of help with walking and/or transfers;Help with stairs or ramp for entrance;Assistance with cooking/housework   Can travel by private vehicle   No    Equipment Recommendations None recommended by PT  Recommendations for Other Services       Functional Status Assessment Patient has had a recent decline in their functional status and demonstrates the ability to make significant improvements in function  in a reasonable and predictable amount of time.     Precautions / Restrictions Precautions Precautions: Fall Restrictions Weight Bearing Restrictions Per Provider Order: No      Mobility  Bed Mobility Overal bed mobility: Needs Assistance Bed Mobility: Supine to Sit     Supine to sit: Mod assist, Max assist     General bed mobility comments: increased time, labored  movement    Transfers Overall transfer level: Needs assistance Equipment used: Rolling walker (2 wheels) Transfers: Sit to/from Stand, Bed to chair/wheelchair/BSC Sit to Stand: Mod assist   Step pivot transfers: Mod assist       General transfer comment: unsteady labored movement with buckling of knees, apprehension    Ambulation/Gait Ambulation/Gait assistance: Max assist Gait Distance (Feet): 3 Feet Assistive device: Rolling walker (2 wheels) Gait Pattern/deviations: Decreased step length - right, Decreased step length - left, Decreased stride length, Knees buckling Gait velocity: slow     General Gait Details: limited to a few slow labored side steps before having to sit due to fall risk  Stairs            Wheelchair Mobility     Tilt Bed    Modified Rankin (Stroke Patients Only)       Balance Overall balance assessment: Needs assistance Sitting-balance support: Feet supported, No upper extremity supported Sitting balance-Leahy Scale: Poor Sitting balance - Comments: fair/poor seated at EOB Postural control: Posterior lean Standing balance support: Reliant on assistive device for balance, During functional activity, Bilateral upper extremity supported Standing balance-Leahy Scale: Poor Standing balance comment: using RW  Pertinent Vitals/Pain Pain Assessment Pain Assessment: Faces Faces Pain Scale: Hurts little more Pain Location: legs, buttocks Pain Descriptors / Indicators: Discomfort, Grimacing, Guarding, Sore Pain Intervention(s):  Limited activity within patient's tolerance, Monitored during session, Repositioned    Home Living Family/patient expects to be discharged to:: Private residence Living Arrangements: Children Available Help at Discharge: Family;Available PRN/intermittently Type of Home: House         Home Layout: One level Home Equipment: Agricultural consultant (2 wheels);Cane - single point      Prior Function Prior Level of Function : Needs assist       Physical Assist : Mobility (physical);ADLs (physical) Mobility (physical): Bed mobility;Transfers;Gait;Stairs   Mobility Comments: Household ambulation using RW ADLs Comments: Assisted by family     Extremity/Trunk Assessment   Upper Extremity Assessment Upper Extremity Assessment: Generalized weakness    Lower Extremity Assessment Lower Extremity Assessment: Generalized weakness    Cervical / Trunk Assessment Cervical / Trunk Assessment: Kyphotic  Communication   Communication Factors Affecting Communication: Difficulty expressing self    Cognition Arousal: Alert Behavior During Therapy: Flat affect, Anxious   PT - Cognitive impairments: History of cognitive impairments                         Following commands: Impaired Following commands impaired: Follows one step commands with increased time     Cueing Cueing Techniques: Verbal cues, Tactile cues     General Comments      Exercises     Assessment/Plan    PT Assessment Patient needs continued PT services  PT Problem List Decreased strength;Decreased activity tolerance;Decreased mobility       PT Treatment Interventions DME instruction;Gait training;Stair training;Functional mobility training;Therapeutic activities;Therapeutic exercise;Balance training;Patient/family education    PT Goals (Current goals can be found in the Care Plan section)  Acute Rehab PT Goals Patient Stated Goal: return home PT Goal Formulation: With patient/family Time For Goal  Achievement: 09/18/23 Potential to Achieve Goals: Good    Frequency Min 3X/week     Co-evaluation               AM-PAC PT "6 Clicks" Mobility  Outcome Measure Help needed turning from your back to your side while in a flat bed without using bedrails?: A Lot Help needed moving from lying on your back to sitting on the side of a flat bed without using bedrails?: A Lot Help needed moving to and from a bed to a chair (including a wheelchair)?: A Lot Help needed standing up from a chair using your arms (e.g., wheelchair or bedside chair)?: A Lot Help needed to walk in hospital room?: A Lot Help needed climbing 3-5 steps with a railing? : Total 6 Click Score: 11    End of Session   Activity Tolerance: Patient tolerated treatment well;Patient limited by fatigue Patient left: in chair;with call bell/phone within reach;with family/visitor present Nurse Communication: Mobility status PT Visit Diagnosis: Unsteadiness on feet (R26.81);Other abnormalities of gait and mobility (R26.89);Muscle weakness (generalized) (M62.81)    Time: 1610-9604 PT Time Calculation (min) (ACUTE ONLY): 22 min   Charges:   PT Evaluation $PT Eval Moderate Complexity: 1 Mod PT Treatments $Therapeutic Activity: 8-22 mins PT General Charges $$ ACUTE PT VISIT: 1 Visit         12:14 PM, 09/04/23 Ocie Bob, MPT Physical Therapist with [facility] [phone] office [phone] mobile phone

## 2023-09-04 NOTE — Progress Notes (Signed)
 Patient ID: Mikayla Wilson, female   DOB: 29-Dec-1930, 89 y.o.   MRN: [id number]  PT positioned patient in chair. I asked them to help me stand her up so I could assess her sacral area because she was c/o pain. With 2 Physical Therapists helping stand her, patient is screaming in pain. Placed back in the chair.MD notified. No new orders received. Tylenol given.   Lidia Collum, RN

## 2023-09-04 NOTE — Plan of Care (Signed)
   Problem: Activity: Goal: Risk for activity intolerance will decrease Outcome: Progressing   Problem: Nutrition: Goal: Adequate nutrition will be maintained Outcome: Progressing

## 2023-09-04 NOTE — Progress Notes (Addendum)
 PROGRESS NOTE  Mikayla Wilson WUJ:[id number] DOB: March 03, 1931 DOA: 09/02/2023 PCP: Dettinger, Elige Radon, MD  Brief History:  89 year old female with a history of cognitive impairment, coronary artery disease, anxiety, GERD, hyperlipidemia, asthma presenting with generalized weakness and confusion for 2 days.  History is obtained from the patient's daughter.  Patient is unable provide any history secondary to her acute encephalopathy.  At baseline, the patient ambulates with a walker.  Daughter states that the patient has " a touch of dementia".  Daughter helps assist the patient with bathing and clothing.  Daughter has noted that the patient has had decreased oral intake with confusion and agitation for last 2 days.  There is no reports of vomiting, diarrhea, abdominal pain, hemoptysis, fever, chills. In the ED, the patient was afebrile hemodynamically stable with oxygen saturation 94% room air.  WBC 12.1, hemoglobin 13.0, platelets 157.  Sodium 130, potassium 3.9, bicarbonate 21, serum creatinine 0.9.  LFTs were unremarkable.  Ammonia 19.  COVID-19 PCR is negative.  Lactic acid 1.8.  UA showed>50 WBC.  Chest x-ray showed upper lobe opacities.  CT brain was negative for acute findings.  Patient was started on ceftriaxone and azithromycin.  She was admitted for further evaluation treatment for confusion.   Assessment/Plan: Sepsis -Present on admission -Presented with tachycardia and leukocytosis -Due to UTI and pneumonia -Lactic acid 1.8 -UA>50 WBC -Chest x-ray with upper lobe opacities   Lobar pneumonia -Continue ceftriaxone and azithromycin -COVID-19/RSV/flu--neg -viral respiratory panel neg   Acute respiratory failure with hypoxia -had oxygen saturation 89% on RA -stable on 1.5L>>RA -wean to RA for saturation >92%   Klebsiella UTI -Concerning for UTI -UA>50 WBC -continue ceftriaxone   Acute metabolic encephalopathy -Secondary to infectious process -Ammonia 19 -remains  agitated and confused on 2/20 -CT brain negative -B12--101>>IM x 1 given, then start po B12 -folate--13.0 -TSH--1.522 -MR brain--neg for acute findings -ativan scheduled every am--discussed with daughter and she is adamant about daily ativan -2/21-mental status improving   Anxiety -Continue home dose Ativan with as needed dosing for agitation -PDMP reviewed -1 mg, #40, last refill 08/13/2023   GERD -Continue PPI   Back pain -pt complains low and mid back pain -daughter concerned pt had unwitnessed fall -obtain thoracic and lumbar spine xrays  Leg pain -venous duplex           Family Communication:   daughter updated 2/21   Consultants:  none   Code Status:  FULL    DVT Prophylaxis:   Lovenox     Procedures: As Listed in Progress Note Above   Antibiotics: Ceftriaxone 2/19>> Azithro 2/19>    Total time spent 50 minutes.  Greater than 50% spent face to face counseling and coordinating care.      Subjective: Pt complains of back pain.  Denies n/v/d, cp, abd pain, sob  Objective: Vitals:   09/03/23 1730 09/03/23 2054 09/04/23 0455 09/04/23 1650  BP: 121/67 (!) 155/82 (!) 147/77 114/60  Pulse: 88 79 100 96  Resp: 19 18 18    Temp: 97.6 F (36.4 C) 98 F (36.7 C) 98.2 F (36.8 C) 97.8 F (36.6 C)  TempSrc: Axillary   Oral  SpO2: 99% 100% 100% 94%  Weight:      Height:        Intake/Output Summary (Last 24 hours) at 09/04/2023 1807 Last data filed at 09/04/2023 1700 Gross per 24 hour  Intake 1612.39 ml  Output 1000 ml  Net  612.39 ml   Weight change:  Exam:  General:  Pt is alert, follows commands appropriately, not in acute distress HEENT: No icterus, No thrush, No neck mass, [place]/AT Cardiovascular: bibasilar crackles.  No wheeze Abdomen: Soft/+BS, non tender, non distended, no guarding Extremities: 1 + LE edema, No lymphangitis, No petechiae, No rashes, no synovitis   Data Reviewed: I have personally reviewed following labs and imaging  studies Basic Metabolic Panel: Recent Labs  Lab 09/02/23 0209 09/03/23 0501 09/04/23 0519  NA 138 140 139  K 3.9 3.1* 3.5  CL 103 105 106  CO2 21* 22 22  GLUCOSE 140* 91 58*  BUN 18 8 5*  CREATININE 0.89 0.70 0.66  CALCIUM 9.1 8.4* 8.4*  MG  --   --  2.0   Liver Function Tests: Recent Labs  Lab 09/02/23 0209  AST 22  ALT 16  ALKPHOS 84  BILITOT 1.3*  PROT 7.4  ALBUMIN 3.7   No results for input(s): "LIPASE", "AMYLASE" in the last 168 hours. Recent Labs  Lab 09/02/23 0210  AMMONIA 19   Coagulation Profile: Recent Labs  Lab 09/03/23 0501  INR 1.1   CBC: Recent Labs  Lab 09/02/23 0209 09/03/23 0501  WBC 12.1* 8.5  NEUTROABS 9.4*  --   HGB 13.0 11.2*  HCT 39.8 32.8*  MCV 94.3 94.5  PLT 157 148*   Cardiac Enzymes: No results for input(s): "CKTOTAL", "CKMB", "CKMBINDEX", "TROPONINI" in the last 168 hours. BNP: Invalid input(s): "POCBNP" CBG: No results for input(s): "GLUCAP" in the last 168 hours. HbA1C: No results for input(s): "HGBA1C" in the last 72 hours. Urine analysis:    Component Value Date/Time   COLORURINE YELLOW 09/02/2023 0209   APPEARANCEUR TURBID (A) 09/02/2023 0209   APPEARANCEUR Clear 09/19/2020 1017   LABSPEC 1.020 09/02/2023 0209   PHURINE 5.0 09/02/2023 0209   GLUCOSEU NEGATIVE 09/02/2023 0209   HGBUR SMALL (A) 09/02/2023 0209   BILIRUBINUR NEGATIVE 09/02/2023 0209   BILIRUBINUR Negative 09/19/2020 1017   KETONESUR 5 (A) 09/02/2023 0209   PROTEINUR 100 (A) 09/02/2023 0209   UROBILINOGEN 0.2 01/25/2015 2330   NITRITE NEGATIVE 09/02/2023 0209   LEUKOCYTESUR MODERATE (A) 09/02/2023 0209   Sepsis Labs: @LABRCNTIP (procalcitonin:4,lacticidven:4) ) Recent Results (from the past 240 hours)  Blood Culture (routine x 2)     Status: None (Preliminary result)   Collection Time: 09/02/23  2:09 AM   Specimen: BLOOD  Result Value Ref Range Status   Specimen Description BLOOD BLOOD LEFT FOREARM  Final   Special Requests   Final     BOTTLES DRAWN AEROBIC AND ANAEROBIC Blood Culture adequate volume   Culture   Final    NO GROWTH 2 DAYS Performed at [facility], [address] [place], [place] [address]    Report Status PENDING  Incomplete  Resp panel by RT-PCR (RSV, Flu A&B, Covid) Anterior Nasal Swab     Status: None   Collection Time: 09/02/23  2:09 AM   Specimen: Anterior Nasal Swab  Result Value Ref Range Status   SARS Coronavirus 2 by RT PCR NEGATIVE NEGATIVE Final    Comment: (NOTE) SARS-CoV-2 target nucleic acids are NOT DETECTED.  The SARS-CoV-2 RNA is generally detectable in upper respiratory specimens during the acute phase of infection. The lowest concentration of SARS-CoV-2 viral copies this assay can detect is 138 copies/mL. A negative result does not preclude SARS-Cov-2 infection and should not be used as the sole basis for treatment or other patient management decisions. A negative result  may occur with  improper specimen collection/handling, submission of specimen other than nasopharyngeal swab, presence of viral mutation(s) within the areas targeted by this assay, and inadequate number of viral copies(<138 copies/mL). A negative result must be combined with clinical observations, patient history, and epidemiological information. The expected result is Negative.  Fact Sheet for Patients:  BloggerCourse.com  Fact Sheet for Healthcare Providers:  SeriousBroker.it  This test is no t yet approved or cleared by the [place] FDA and  has been authorized for detection and/or diagnosis of SARS-CoV-2 by FDA under an Emergency Use Authorization (EUA). This EUA will remain  in effect (meaning this test can be used) for the duration of the COVID-19 declaration under Section 564(b)(1) of the Act, 21 U.S.C.section 360bbb-3(b)(1), unless the authorization is terminated  or revoked sooner.       Influenza A by PCR NEGATIVE NEGATIVE Final    Influenza B by PCR NEGATIVE NEGATIVE Final    Comment: (NOTE) The Xpert Xpress SARS-CoV-2/FLU/RSV plus assay is intended as an aid in the diagnosis of influenza from Nasopharyngeal swab specimens and should not be used as a sole basis for treatment. Nasal washings and aspirates are unacceptable for Xpert Xpress SARS-CoV-2/FLU/RSV testing.  Fact Sheet for Patients: BloggerCourse.com  Fact Sheet for Healthcare Providers: SeriousBroker.it  This test is not yet approved or cleared by the [place] FDA and has been authorized for detection and/or diagnosis of SARS-CoV-2 by FDA under an Emergency Use Authorization (EUA). This EUA will remain in effect (meaning this test can be used) for the duration of the COVID-19 declaration under Section 564(b)(1) of the Act, 21 U.S.C. section 360bbb-3(b)(1), unless the authorization is terminated or revoked.     Resp Syncytial Virus by PCR NEGATIVE NEGATIVE Final    Comment: (NOTE) Fact Sheet for Patients: BloggerCourse.com  Fact Sheet for Healthcare Providers: SeriousBroker.it  This test is not yet approved or cleared by the [place] FDA and has been authorized for detection and/or diagnosis of SARS-CoV-2 by FDA under an Emergency Use Authorization (EUA). This EUA will remain in effect (meaning this test can be used) for the duration of the COVID-19 declaration under Section 564(b)(1) of the Act, 21 U.S.C. section 360bbb-3(b)(1), unless the authorization is terminated or revoked.  Performed at [facility], [address] [place], [place] [address]   Blood Culture (routine x 2)     Status: None (Preliminary result)   Collection Time: 09/02/23  2:14 AM   Specimen: BLOOD  Result Value Ref Range Status   Specimen Description BLOOD BLOOD LEFT HAND  Final   Special Requests   Final    BOTTLES DRAWN AEROBIC ONLY Blood Culture results may  not be optimal due to an inadequate volume of blood received in culture bottles   Culture   Final    NO GROWTH 2 DAYS Performed at [facility], [address], [place], [place] [address]    Report Status PENDING  Incomplete  Urine Culture     Status: Abnormal   Collection Time: 09/02/23  2:29 AM   Specimen: Urine, Catheterized  Result Value Ref Range Status   Specimen Description   Final    URINE, CATHETERIZED Performed at [facility], [address] [place], [place] [address]    Special Requests   Final    NONE Performed at [facility], [address], [place], [place] [address]    Culture >=100,000 COLONIES/mL KLEBSIELLA PNEUMONIAE (A)  Final   Report Status 09/04/2023 FINAL  Final  Organism ID, Bacteria KLEBSIELLA PNEUMONIAE (A)  Final      Susceptibility   Klebsiella pneumoniae - MIC*    AMPICILLIN RESISTANT Resistant     CEFAZOLIN <=4 SENSITIVE Sensitive     CEFEPIME <=0.12 SENSITIVE Sensitive     CEFTRIAXONE <=0.25 SENSITIVE Sensitive     CIPROFLOXACIN <=0.25 SENSITIVE Sensitive     GENTAMICIN <=1 SENSITIVE Sensitive     IMIPENEM 0.5 SENSITIVE Sensitive     NITROFURANTOIN 32 SENSITIVE Sensitive     TRIMETH/SULFA <=20 SENSITIVE Sensitive     AMPICILLIN/SULBACTAM <=2 SENSITIVE Sensitive     PIP/TAZO <=4 SENSITIVE Sensitive ug/mL    * >=100,000 COLONIES/mL KLEBSIELLA PNEUMONIAE  Respiratory (~20 pathogens) panel by PCR     Status: None   Collection Time: 09/02/23  6:20 PM   Specimen: Nasopharyngeal Swab; Respiratory  Result Value Ref Range Status   Adenovirus NOT DETECTED NOT DETECTED Final   Coronavirus 229E NOT DETECTED NOT DETECTED Final    Comment: (NOTE) The Coronavirus on the Respiratory Panel, DOES NOT test for the novel  Coronavirus (2019 nCoV)    Coronavirus HKU1 NOT DETECTED NOT DETECTED Final   Coronavirus NL63 NOT DETECTED NOT DETECTED Final   Coronavirus OC43 NOT DETECTED NOT DETECTED Final   Metapneumovirus NOT DETECTED NOT DETECTED Final    Rhinovirus / Enterovirus NOT DETECTED NOT DETECTED Final   Influenza A NOT DETECTED NOT DETECTED Final   Influenza B NOT DETECTED NOT DETECTED Final   Parainfluenza Virus 1 NOT DETECTED NOT DETECTED Final   Parainfluenza Virus 2 NOT DETECTED NOT DETECTED Final   Parainfluenza Virus 3 NOT DETECTED NOT DETECTED Final   Parainfluenza Virus 4 NOT DETECTED NOT DETECTED Final   Respiratory Syncytial Virus NOT DETECTED NOT DETECTED Final   Bordetella pertussis NOT DETECTED NOT DETECTED Final   Bordetella Parapertussis NOT DETECTED NOT DETECTED Final   Chlamydophila pneumoniae NOT DETECTED NOT DETECTED Final   Mycoplasma pneumoniae NOT DETECTED NOT DETECTED Final    Comment: Performed at [facility], [address], [place], [place] [address]     Scheduled Meds:  Chlorhexidine Gluconate Cloth  6 each Topical Q0600   [START ON 09/05/2023] enoxaparin (LOVENOX) injection  40 mg Subcutaneous Q24H   [START ON 09/05/2023] feeding supplement  237 mL Oral BID BM   [START ON 09/05/2023] LORazepam  0.25 mg Oral q morning   pantoprazole (PROTONIX) IV  40 mg Intravenous Q24H   vitamin B-12  500 mcg Oral Daily   Continuous Infusions:  azithromycin 500 mg (09/04/23 0514)   cefTRIAXone (ROCEPHIN)  IV Stopped (09/03/23 2257)    Procedures/Studies: MR BRAIN WO CONTRAST Result Date: 09/03/2023 CLINICAL DATA:  Provided history: Mental status change, unknown cause. EXAM: MRI HEAD WITHOUT CONTRAST TECHNIQUE: Multiplanar, multiecho pulse sequences of the brain and surrounding structures were obtained without intravenous contrast. COMPARISON:  Non-contrast head CT 09/02/2023.  Brain MRI 10/08/2004. FINDINGS: Intermittently motion degraded examination. Most notably, there is moderate motion degradation of least motion degraded axial FLAIR sequence. Within this limitation, findings are as follows. Brain: Mild-to-moderate for age generalized cerebral atrophy. Chronic lacunar infarct within the right corona radiata,  new from the prior brain MRI of 10/08/2004. Multifocal T2 FLAIR hyperintense signal abnormality elsewhere within the cerebral white matter, nonspecific but compatible with mild-to-moderate chronic small vessel ischemic disease. Partially empty sella turcica. No cortical encephalomalacia is identified. There is no acute infarct. No evidence of an intracranial mass. No extra-axial fluid collection. No midline shift. Vascular: Maintained flow voids  within the proximal large arterial vessels. Skull and upper cervical spine: No focal worrisome marrow lesion. Incompletely assessed cervical spondylosis. Sinuses/Orbits: No mass or acute finding within the imaged orbits. Prior bilateral lens replacement. Mild mucosal thickening within the right maxillary and right sphenoid sinuses. Other: Trace fluid within bilateral mastoid air cells. IMPRESSION: 1. Intermittently motion degraded examination. Within this limitation, findings are as follows. 2. No evidence of an acute intracranial abnormality. The diffusion-weighted imaging is of good quality and there is no evidence of an acute infarct. 3. Chronic lacunar infarct within the right corona radiata, new from the prior brain MRI of 10/08/2004. 4. Background mild-to-moderate cerebral white matter chronic small vessel ischemic disease, progressed. 5. Mild-to-moderate for age generalized cerebral atrophy, also progressed. 6. Mild paranasal sinus mucosal thickening. 7. Trace bilateral mastoid effusions. Electronically Signed   By: Jackey Loge D.O.   On: 09/03/2023 18:09   CT Head Wo Contrast Result Date: 09/02/2023 CLINICAL DATA:  Delirium EXAM: CT HEAD WITHOUT CONTRAST TECHNIQUE: Contiguous axial images were obtained from the base of the skull through the vertex without intravenous contrast. RADIATION DOSE REDUCTION: This exam was performed according to the departmental dose-optimization program which includes automated exposure control, adjustment of the mA and/or kV according  to patient size and/or use of iterative reconstruction technique. COMPARISON:  None Available. FINDINGS: Brain: No evidence of acute infarction, hemorrhage, hydrocephalus, extra-axial collection or mass lesion/mass effect. Cerebral atrophy. Partially empty sella. Vascular: No hyperdense vessel or unexpected calcification. Skull: No acute fracture. Sinuses/Orbits: No acute finding. Other: No mastoid effusions. IMPRESSION: No evidence of acute intracranial abnormality. Electronically Signed   By: Feliberto Harts M.D.   On: 09/02/2023 03:49   DG Chest Port 1 View Result Date: 09/02/2023 CLINICAL DATA:  Sepsis EXAM: PORTABLE CHEST 1 VIEW COMPARISON:  08/18/2017 FINDINGS: The lungs are symmetrically well expanded. Reticulonodular infiltrates have developed asymmetrically within the upper lung zones bilaterally, more severe within right upper lobe, likely reflecting changes of atypical infection, though hypersensitivity pneumonitis or smoking related lung disease could appear similarly in the chronic setting. No pneumothorax or pleural effusion. Cardiac size within limits. Pulmonary vascularity is normal. No acute bone abnormality., IMPRESSION: 1. Reticulonodular infiltrates within the upper lung zones bilaterally, more severe within right upper lobe, likely reflecting changes of atypical infection in the acute setting. Electronically Signed   By: Helyn Numbers M.D.   On: 09/02/2023 03:19    Catarina Hartshorn, DO  [facility]  If 7PM-7AM, please contact night-coverage [email or web address] Password [email or web address] 09/04/2023, 6:07 PM   LOS: 2 days

## 2023-09-05 ENCOUNTER — Inpatient Hospital Stay (HOSPITAL_COMMUNITY): Payer: Medicare HMO

## 2023-09-05 ENCOUNTER — Other Ambulatory Visit: Payer: Self-pay

## 2023-09-05 DIAGNOSIS — N3000 Acute cystitis without hematuria: Secondary | ICD-10-CM | POA: Diagnosis not present

## 2023-09-05 DIAGNOSIS — A4150 Gram-negative sepsis, unspecified: Secondary | ICD-10-CM | POA: Diagnosis not present

## 2023-09-05 DIAGNOSIS — G9341 Metabolic encephalopathy: Secondary | ICD-10-CM | POA: Diagnosis not present

## 2023-09-05 DIAGNOSIS — J181 Lobar pneumonia, unspecified organism: Secondary | ICD-10-CM | POA: Diagnosis not present

## 2023-09-05 LAB — BASIC METABOLIC PANEL
Anion gap: 8 (ref 5–15)
BUN: 8 mg/dL (ref 8–23)
CO2: 23 mmol/L (ref 22–32)
Calcium: 8.1 mg/dL — ABNORMAL LOW (ref 8.9–10.3)
Chloride: 105 mmol/L (ref 98–111)
Creatinine, Ser: 0.72 mg/dL (ref 0.44–1.00)
GFR, Estimated: >60 mL/min (ref >60)
Glucose, Bld: 104 mg/dL — ABNORMAL HIGH (ref 70–99)
Potassium: 3 mmol/L — ABNORMAL LOW (ref 3.5–5.1)
Sodium: 136 mmol/L (ref 135–145)

## 2023-09-05 LAB — CK: Total CK: 53 U/L (ref 38–234)

## 2023-09-05 MED ORDER — ACETAMINOPHEN 500 MG PO TABS
1000.0000 mg | ORAL_TABLET | Freq: Three times a day (TID) | ORAL | Status: DC
  Administered 2023-09-05 – 2023-09-09 (×13): 1000 mg via ORAL
  Filled 2023-09-05 (×13): qty 2

## 2023-09-05 MED ORDER — ESCITALOPRAM OXALATE 10 MG PO TABS
5.0000 mg | ORAL_TABLET | Freq: Every day | ORAL | Status: DC
  Administered 2023-09-05 – 2023-09-09 (×5): 5 mg via ORAL
  Filled 2023-09-05 (×6): qty 1

## 2023-09-05 MED ORDER — POTASSIUM CHLORIDE CRYS ER 20 MEQ PO TBCR
40.0000 meq | EXTENDED_RELEASE_TABLET | Freq: Once | ORAL | Status: AC
  Administered 2023-09-05: 40 meq via ORAL
  Filled 2023-09-05: qty 2

## 2023-09-05 MED ORDER — ROSUVASTATIN CALCIUM 10 MG PO TABS
5.0000 mg | ORAL_TABLET | Freq: Every day | ORAL | Status: DC
  Administered 2023-09-05 – 2023-09-09 (×5): 5 mg via ORAL
  Filled 2023-09-05 (×5): qty 1

## 2023-09-05 MED ORDER — LORAZEPAM 0.5 MG PO TABS
0.5000 mg | ORAL_TABLET | Freq: Every morning | ORAL | Status: DC
  Administered 2023-09-06: 0.5 mg via ORAL
  Filled 2023-09-05: qty 1

## 2023-09-05 NOTE — TOC Progression Note (Signed)
 Transition of Care [facility]) - Progression Note    Patient Details  Name: Mikayla Wilson MRN: [id number] Date of Birth: 06/30/1931  [facility] CM/SW Contact  Catalina [place], [place] Phone Number: 09/05/2023, 3:11 PM  Clinical Narrative:    CSW reviewed Navi, pt approved for SNF #[id number] Approved 2/21-2/25. Pt may be ready for DC tomorrow.  TOC to follow.    Expected Discharge Plan: Skilled Nursing Facility Barriers to Discharge: Continued Medical Work up  Expected Discharge Plan and Services     Post Acute Care Choice: Skilled Nursing Facility Living arrangements for the past 2 months: Single Family Home                                       Social Determinants of Health (SDOH) Interventions SDOH Screenings   Food Insecurity: Patient Unable To Answer (09/03/2023)  Housing: Patient Unable To Answer (09/03/2023)  Transportation Needs: Patient Unable To Answer (09/03/2023)  Utilities: Patient Unable To Answer (09/03/2023)  Depression (PHQ2-9): Low Risk  (03/09/2023)  Social Connections: Unknown (09/03/2023)  Tobacco Use: High Risk (09/02/2023)    Readmission Risk Interventions     No data to display

## 2023-09-05 NOTE — Progress Notes (Signed)
 PROGRESS NOTE  Mikayla Wilson BJY:[id number] DOB: [birth year] DOA: 09/02/2023 PCP: Dettinger, Elige Radon, MD  Brief History:  89 year old female with a history of cognitive impairment, coronary artery disease, anxiety, GERD, hyperlipidemia, asthma presenting with generalized weakness and confusion for 2 days.  History is obtained from the patient's daughter.  Patient is unable provide any history secondary to her acute encephalopathy.  At baseline, the patient ambulates with a walker.  Daughter states that the patient has " a touch of dementia".  Daughter helps assist the patient with bathing and clothing.  Daughter has noted that the patient has had decreased oral intake with confusion and agitation for last 2 days.  There is no reports of vomiting, diarrhea, abdominal pain, hemoptysis, fever, chills. In the ED, the patient was afebrile hemodynamically stable with oxygen saturation 94% room air.  WBC 12.1, hemoglobin 13.0, platelets 157.  Sodium 130, potassium 3.9, bicarbonate 21, serum creatinine 0.9.  LFTs were unremarkable.  Ammonia 19.  COVID-19 PCR is negative.  Lactic acid 1.8.  UA showed>50 WBC.  Chest x-ray showed upper lobe opacities.  CT brain was negative for acute findings.  Patient was started on ceftriaxone and azithromycin.  She was admitted for further evaluation treatment for confusion.   Assessment/Plan: Sepsis -Present on admission -Presented with tachycardia and leukocytosis -Due to UTI and pneumonia -Lactic acid 1.8 -UA>50 WBC -Chest x-ray with upper lobe opacities   Lobar pneumonia -Continue ceftriaxone and azithromycin -COVID-19/RSV/flu--neg -viral respiratory panel neg   Acute respiratory failure with hypoxia -had oxygen saturation 89% on RA -stable on 1.5L>>RA -wean to RA for saturation >92%   Klebsiella UTI -Concerning for UTI -UA>50 WBC -continue ceftriaxone   Acute metabolic encephalopathy -Secondary to infectious process and low B12 -Ammonia  19 -remains agitated and confused on 2/20 -CT brain negative -B12--101>>IM x 1 given, then start po B12 -folate--13.0 -TSH--1.522 -MR brain--neg for acute findings -ativan scheduled every am--discussed with daughter and she is adamant about giving daily ativan -2/22-mental status improving   Anxiety -Continue home dose Ativan with as needed dosing for agitation -PDMP reviewed -1 mg, #40, last refill 08/13/2023   GERD -Continue PPI   Back pain -pt complains low and mid back pain -daughter concerned pt had unwitnessed fall -obtain thoracic spine xray--DJD and  -lumbar spine xray--age indeterminant L3 and L5  -xray pelvis -CT lumbar spine to help clarify L3/L5 ,but no red flags on exam -add acetaminophen 1000 mg q 8   Leg pain -venous duplex--neg           Family Communication:   daughter updated 2/21   Consultants:  none   Code Status:  FULL    DVT Prophylaxis:  Gravity Lovenox     Procedures: As Listed in Progress Note Above   Antibiotics: Ceftriaxone 2/19>> Azithro 2/19>            Subjective: ROS limited due to confusion and cognitive impairment.  Denies f/c, cp, sob, abd pain.  No vomiting.    Objective: Vitals:   09/04/23 0455 09/04/23 1650 09/04/23 2036 09/05/23 0537  BP: (!) 147/77 114/60 119/66 121/73  Pulse: 100 96 96 (!) 101  Resp: 18  (!) 24 16  Temp: 98.2 F (36.8 C) 97.8 F (36.6 C) 98.1 F (36.7 C) 98.3 F (36.8 C)  TempSrc:  Oral Oral Oral  SpO2: 100% 94% 94% 94%  Weight:      Height:  Intake/Output Summary (Last 24 hours) at 09/05/2023 0750 Last data filed at 09/05/2023 1610 Gross per 24 hour  Intake 995.04 ml  Output 400 ml  Net 595.04 ml   Weight change:  Exam:  General:  Pt is alert, follows commands appropriately, not in acute distress HEENT: No icterus, No thrush, No neck mass, [place]/AT Cardiovascular: RRR, S1/S2, no rubs, no gallops Respiratory: bibasilar rales.  No wheeze Abdomen: Soft/+BS, non tender, non  distended, no guarding Extremities: trace LE edema, No lymphangitis, No petechiae, No rashes, no synovitis Neuro:  CN II-XII intact, strength 4/5 in RUE, 3/5 RLE, strength 4/5 LUE, 3/5 LLE; sensation intact bilateral; no dysmetria; babinski equivocal    Data Reviewed: I have personally reviewed following labs and imaging studies Basic Metabolic Panel: Recent Labs  Lab 09/02/23 0209 09/03/23 0501 09/04/23 0519 09/05/23 0327  NA 138 140 139 136  K 3.9 3.1* 3.5 3.0*  CL 103 105 106 105  CO2 21* 22 22 23   GLUCOSE 140* 91 58* 104*  BUN 18 8 5* 8  CREATININE 0.89 0.70 0.66 0.72  CALCIUM 9.1 8.4* 8.4* 8.1*  MG  --   --  2.0  --    Liver Function Tests: Recent Labs  Lab 09/02/23 0209  AST 22  ALT 16  ALKPHOS 84  BILITOT 1.3*  PROT 7.4  ALBUMIN 3.7   No results for input(s): "LIPASE", "AMYLASE" in the last 168 hours. Recent Labs  Lab 09/02/23 0210  AMMONIA 19   Coagulation Profile: Recent Labs  Lab 09/03/23 0501  INR 1.1   CBC: Recent Labs  Lab 09/02/23 0209 09/03/23 0501  WBC 12.1* 8.5  NEUTROABS 9.4*  --   HGB 13.0 11.2*  HCT 39.8 32.8*  MCV 94.3 94.5  PLT 157 148*   Cardiac Enzymes: Recent Labs  Lab 09/05/23 0327  CKTOTAL 53   BNP: Invalid input(s): "POCBNP" CBG: No results for input(s): "GLUCAP" in the last 168 hours. HbA1C: No results for input(s): "HGBA1C" in the last 72 hours. Urine analysis:    Component Value Date/Time   COLORURINE YELLOW 09/02/2023 0209   APPEARANCEUR TURBID (A) 09/02/2023 0209   APPEARANCEUR Clear 09/19/2020 1017   LABSPEC 1.020 09/02/2023 0209   PHURINE 5.0 09/02/2023 0209   GLUCOSEU NEGATIVE 09/02/2023 0209   HGBUR SMALL (A) 09/02/2023 0209   BILIRUBINUR NEGATIVE 09/02/2023 0209   BILIRUBINUR Negative 09/19/2020 1017   KETONESUR 5 (A) 09/02/2023 0209   PROTEINUR 100 (A) 09/02/2023 0209   UROBILINOGEN 0.2 01/25/2015 2330   NITRITE NEGATIVE 09/02/2023 0209   LEUKOCYTESUR MODERATE (A) 09/02/2023 0209   Sepsis  Labs: @LABRCNTIP (procalcitonin:4,lacticidven:4) ) Recent Results (from the past 240 hours)  Blood Culture (routine x 2)     Status: None (Preliminary result)   Collection Time: 09/02/23  2:09 AM   Specimen: BLOOD  Result Value Ref Range Status   Specimen Description BLOOD BLOOD LEFT FOREARM  Final   Special Requests   Final    BOTTLES DRAWN AEROBIC AND ANAEROBIC Blood Culture adequate volume   Culture   Final    NO GROWTH 2 DAYS Performed at [facility], [address], [place], [place] [address]    Report Status PENDING  Incomplete  Resp panel by RT-PCR (RSV, Flu A&B, Covid) Anterior Nasal Swab     Status: None   Collection Time: 09/02/23  2:09 AM   Specimen: Anterior Nasal Swab  Result Value Ref Range Status   SARS Coronavirus 2 by RT PCR NEGATIVE NEGATIVE Final  Comment: (NOTE) SARS-CoV-2 target nucleic acids are NOT DETECTED.  The SARS-CoV-2 RNA is generally detectable in upper respiratory specimens during the acute phase of infection. The lowest concentration of SARS-CoV-2 viral copies this assay can detect is 138 copies/mL. A negative result does not preclude SARS-Cov-2 infection and should not be used as the sole basis for treatment or other patient management decisions. A negative result may occur with  improper specimen collection/handling, submission of specimen other than nasopharyngeal swab, presence of viral mutation(s) within the areas targeted by this assay, and inadequate number of viral copies(<138 copies/mL). A negative result must be combined with clinical observations, patient history, and epidemiological information. The expected result is Negative.  Fact Sheet for Patients:  BloggerCourse.com  Fact Sheet for Healthcare Providers:  SeriousBroker.it  This test is no t yet approved or cleared by the [place] FDA and  has been authorized for detection and/or diagnosis of SARS-CoV-2 by FDA under an  Emergency Use Authorization (EUA). This EUA will remain  in effect (meaning this test can be used) for the duration of the COVID-19 declaration under Section 564(b)(1) of the Act, 21 U.S.C.section 360bbb-3(b)(1), unless the authorization is terminated  or revoked sooner.       Influenza A by PCR NEGATIVE NEGATIVE Final   Influenza B by PCR NEGATIVE NEGATIVE Final    Comment: (NOTE) The Xpert Xpress SARS-CoV-2/FLU/RSV plus assay is intended as an aid in the diagnosis of influenza from Nasopharyngeal swab specimens and should not be used as a sole basis for treatment. Nasal washings and aspirates are unacceptable for Xpert Xpress SARS-CoV-2/FLU/RSV testing.  Fact Sheet for Patients: BloggerCourse.com  Fact Sheet for Healthcare Providers: SeriousBroker.it  This test is not yet approved or cleared by the [place] FDA and has been authorized for detection and/or diagnosis of SARS-CoV-2 by FDA under an Emergency Use Authorization (EUA). This EUA will remain in effect (meaning this test can be used) for the duration of the COVID-19 declaration under Section 564(b)(1) of the Act, 21 U.S.C. section 360bbb-3(b)(1), unless the authorization is terminated or revoked.     Resp Syncytial Virus by PCR NEGATIVE NEGATIVE Final    Comment: (NOTE) Fact Sheet for Patients: BloggerCourse.com  Fact Sheet for Healthcare Providers: SeriousBroker.it  This test is not yet approved or cleared by the [place] FDA and has been authorized for detection and/or diagnosis of SARS-CoV-2 by FDA under an Emergency Use Authorization (EUA). This EUA will remain in effect (meaning this test can be used) for the duration of the COVID-19 declaration under Section 564(b)(1) of the Act, 21 U.S.C. section 360bbb-3(b)(1), unless the authorization is terminated or revoked.  Performed at [facility],  [address], [place], [place] [address]   Blood Culture (routine x 2)     Status: None (Preliminary result)   Collection Time: 09/02/23  2:14 AM   Specimen: BLOOD  Result Value Ref Range Status   Specimen Description BLOOD BLOOD LEFT HAND  Final   Special Requests   Final    BOTTLES DRAWN AEROBIC ONLY Blood Culture results may not be optimal due to an inadequate volume of blood received in culture bottles   Culture   Final    NO GROWTH 2 DAYS Performed at [facility], [address], [place], [place] [address]    Report Status PENDING  Incomplete  Urine Culture     Status: Abnormal   Collection Time: 09/02/23  2:29 AM   Specimen: Urine, Catheterized  Result Value Ref Range  Status   Specimen Description   Final    URINE, CATHETERIZED Performed at [facility], [address], [place], [place] [address]    Special Requests   Final    NONE Performed at [facility], [address], [place], [place] [address]    Culture >=100,000 COLONIES/mL KLEBSIELLA PNEUMONIAE (A)  Final   Report Status 09/04/2023 FINAL  Final   Organism ID, Bacteria KLEBSIELLA PNEUMONIAE (A)  Final      Susceptibility   Klebsiella pneumoniae - MIC*    AMPICILLIN RESISTANT Resistant     CEFAZOLIN <=4 SENSITIVE Sensitive     CEFEPIME <=0.12 SENSITIVE Sensitive     CEFTRIAXONE <=0.25 SENSITIVE Sensitive     CIPROFLOXACIN <=0.25 SENSITIVE Sensitive     GENTAMICIN <=1 SENSITIVE Sensitive     IMIPENEM 0.5 SENSITIVE Sensitive     NITROFURANTOIN 32 SENSITIVE Sensitive     TRIMETH/SULFA <=20 SENSITIVE Sensitive     AMPICILLIN/SULBACTAM <=2 SENSITIVE Sensitive     PIP/TAZO <=4 SENSITIVE Sensitive ug/mL    * >=100,000 COLONIES/mL KLEBSIELLA PNEUMONIAE  Respiratory (~20 pathogens) panel by PCR     Status: None   Collection Time: 09/02/23  6:20 PM   Specimen: Nasopharyngeal Swab; Respiratory  Result Value Ref Range Status   Adenovirus NOT DETECTED NOT DETECTED Final   Coronavirus 229E NOT DETECTED NOT DETECTED Final     Comment: (NOTE) The Coronavirus on the Respiratory Panel, DOES NOT test for the novel  Coronavirus (2019 nCoV)    Coronavirus HKU1 NOT DETECTED NOT DETECTED Final   Coronavirus NL63 NOT DETECTED NOT DETECTED Final   Coronavirus OC43 NOT DETECTED NOT DETECTED Final   Metapneumovirus NOT DETECTED NOT DETECTED Final   Rhinovirus / Enterovirus NOT DETECTED NOT DETECTED Final   Influenza A NOT DETECTED NOT DETECTED Final   Influenza B NOT DETECTED NOT DETECTED Final   Parainfluenza Virus 1 NOT DETECTED NOT DETECTED Final   Parainfluenza Virus 2 NOT DETECTED NOT DETECTED Final   Parainfluenza Virus 3 NOT DETECTED NOT DETECTED Final   Parainfluenza Virus 4 NOT DETECTED NOT DETECTED Final   Respiratory Syncytial Virus NOT DETECTED NOT DETECTED Final   Bordetella pertussis NOT DETECTED NOT DETECTED Final   Bordetella Parapertussis NOT DETECTED NOT DETECTED Final   Chlamydophila pneumoniae NOT DETECTED NOT DETECTED Final   Mycoplasma pneumoniae NOT DETECTED NOT DETECTED Final    Comment: Performed at [facility], [address] [place], [place] [address]     Scheduled Meds:  acetaminophen  1,000 mg Oral Q8H   Chlorhexidine Gluconate Cloth  6 each Topical Q0600   enoxaparin (LOVENOX) injection  40 mg Subcutaneous Q24H   feeding supplement  237 mL Oral BID BM   LORazepam  0.25 mg Oral q morning   pantoprazole (PROTONIX) IV  40 mg Intravenous Q24H   potassium chloride  40 mEq Oral Once   vitamin B-12  500 mcg Oral Daily   Continuous Infusions:  azithromycin 500 mg (09/05/23 0537)   cefTRIAXone (ROCEPHIN)  IV Stopped (09/04/23 2239)    Procedures/Studies: US Venous Img Lower Bilateral (DVT) Result Date: 09/04/2023 CLINICAL DATA:  Leg edema and pain EXAM: Bilateral LOWER EXTREMITY VENOUS DOPPLER ULTRASOUND TECHNIQUE: Gray-scale sonography with compression, as well as color and duplex ultrasound, were performed to evaluate the deep venous system(s) from the level of the common femoral  vein through the popliteal and proximal calf veins. COMPARISON:  None Available. FINDINGS: VENOUS Normal compressibility of the common femoral, superficial femoral, and popliteal  veins, as well as the visualized calf veins. Visualized portions of profunda femoral vein and great saphenous vein unremarkable. No filling defects to suggest DVT on grayscale or color Doppler imaging. Doppler waveforms show normal direction of venous flow, normal respiratory plasticity and response to augmentation. OTHER None. Limitations: none IMPRESSION: Negative. Electronically Signed   By: Jasmine Pang M.D.   On: 09/04/2023 18:18   DG Thoracic Spine 2 View Result Date: 09/04/2023 CLINICAL DATA:  Fall with back pain EXAM: THORACIC SPINE 2 VIEWS COMPARISON:  None Available. FINDINGS: Mild scoliosis. Coarse interstitial pulmonary infiltrates. Vertebral body heights are grossly maintained. Mild multilevel degenerative osteophytes. IMPRESSION: Mild scoliosis and degenerative change. Electronically Signed   By: Jasmine Pang M.D.   On: 09/04/2023 18:17   DG Lumbar Spine 2-3 Views Result Date: 09/04/2023 CLINICAL DATA:  Fall with low back pain EXAM: LUMBAR SPINE - 2-3 VIEW COMPARISON:  11/30/2015 FINDINGS: Lumbar alignment is within normal limits. Interval age indeterminate mild-to-moderate compression deformity at L3 and mild compression deformity at L5. Disc spaces appear patent. Multilevel degenerative osteophytes. IMPRESSION: Interval age indeterminate compression deformities at L3 and L5 Electronically Signed   By: Jasmine Pang M.D.   On: 09/04/2023 18:16   MR BRAIN WO CONTRAST Result Date: 09/03/2023 CLINICAL DATA:  Provided history: Mental status change, unknown cause. EXAM: MRI HEAD WITHOUT CONTRAST TECHNIQUE: Multiplanar, multiecho pulse sequences of the brain and surrounding structures were obtained without intravenous contrast. COMPARISON:  Non-contrast head CT 09/02/2023.  Brain MRI 10/08/2004. FINDINGS: Intermittently  motion degraded examination. Most notably, there is moderate motion degradation of least motion degraded axial FLAIR sequence. Within this limitation, findings are as follows. Brain: Mild-to-moderate for age generalized cerebral atrophy. Chronic lacunar infarct within the right corona radiata, new from the prior brain MRI of 10/08/2004. Multifocal T2 FLAIR hyperintense signal abnormality elsewhere within the cerebral white matter, nonspecific but compatible with mild-to-moderate chronic small vessel ischemic disease. Partially empty sella turcica. No cortical encephalomalacia is identified. There is no acute infarct. No evidence of an intracranial mass. No extra-axial fluid collection. No midline shift. Vascular: Maintained flow voids within the proximal large arterial vessels. Skull and upper cervical spine: No focal worrisome marrow lesion. Incompletely assessed cervical spondylosis. Sinuses/Orbits: No mass or acute finding within the imaged orbits. Prior bilateral lens replacement. Mild mucosal thickening within the right maxillary and right sphenoid sinuses. Other: Trace fluid within bilateral mastoid air cells. IMPRESSION: 1. Intermittently motion degraded examination. Within this limitation, findings are as follows. 2. No evidence of an acute intracranial abnormality. The diffusion-weighted imaging is of good quality and there is no evidence of an acute infarct. 3. Chronic lacunar infarct within the right corona radiata, new from the prior brain MRI of 10/08/2004. 4. Background mild-to-moderate cerebral white matter chronic small vessel ischemic disease, progressed. 5. Mild-to-moderate for age generalized cerebral atrophy, also progressed. 6. Mild paranasal sinus mucosal thickening. 7. Trace bilateral mastoid effusions. Electronically Signed   By: Jackey Loge D.O.   On: 09/03/2023 18:09   CT Head Wo Contrast Result Date: 09/02/2023 CLINICAL DATA:  Delirium EXAM: CT HEAD WITHOUT CONTRAST TECHNIQUE:  Contiguous axial images were obtained from the base of the skull through the vertex without intravenous contrast. RADIATION DOSE REDUCTION: This exam was performed according to the departmental dose-optimization program which includes automated exposure control, adjustment of the mA and/or kV according to patient size and/or use of iterative reconstruction technique. COMPARISON:  None Available. FINDINGS: Brain: No evidence of acute infarction, hemorrhage, hydrocephalus, extra-axial collection  or mass lesion/mass effect. Cerebral atrophy. Partially empty sella. Vascular: No hyperdense vessel or unexpected calcification. Skull: No acute fracture. Sinuses/Orbits: No acute finding. Other: No mastoid effusions. IMPRESSION: No evidence of acute intracranial abnormality. Electronically Signed   By: Feliberto Harts M.D.   On: 09/02/2023 03:49   DG Chest Port 1 View Result Date: 09/02/2023 CLINICAL DATA:  Sepsis EXAM: PORTABLE CHEST 1 VIEW COMPARISON:  08/18/2017 FINDINGS: The lungs are symmetrically well expanded. Reticulonodular infiltrates have developed asymmetrically within the upper lung zones bilaterally, more severe within right upper lobe, likely reflecting changes of atypical infection, though hypersensitivity pneumonitis or smoking related lung disease could appear similarly in the chronic setting. No pneumothorax or pleural effusion. Cardiac size within limits. Pulmonary vascularity is normal. No acute bone abnormality., IMPRESSION: 1. Reticulonodular infiltrates within the upper lung zones bilaterally, more severe within right upper lobe, likely reflecting changes of atypical infection in the acute setting. Electronically Signed   By: Helyn Numbers M.D.   On: 09/02/2023 03:19    Catarina Hartshorn, DO  [facility]  If 7PM-7AM, please contact night-coverage [email or web address] Password [facility] 09/05/2023, 7:50 AM   LOS: 3 days

## 2023-09-05 NOTE — Plan of Care (Signed)
  Problem: Clinical Measurements: Goal: Signs and symptoms of infection will decrease Outcome: Progressing   Problem: Respiratory: Goal: Ability to maintain adequate ventilation will improve Outcome: Progressing   Problem: Education: Goal: Knowledge of General Education information will improve Description: Including pain rating scale, medication(s)/side effects and non-pharmacologic comfort measures Outcome: Progressing   Problem: Clinical Measurements: Goal: Respiratory complications will improve Outcome: Progressing   Problem: Clinical Measurements: Goal: Cardiovascular complication will be avoided Outcome: Progressing   Problem: Nutrition: Goal: Adequate nutrition will be maintained Outcome: Progressing   Problem: Activity: Goal: Risk for activity intolerance will decrease Outcome: Progressing   Problem: Coping: Goal: Level of anxiety will decrease Outcome: Progressing   Problem: Elimination: Goal: Will not experience complications related to bowel motility Outcome: Progressing Goal: Will not experience complications related to urinary retention Outcome: Progressing   Problem: Pain Managment: Goal: General experience of comfort will improve and/or be controlled Outcome: Progressing   Problem: Safety: Goal: Ability to remain free from injury will improve Outcome: Progressing   Problem: Skin Integrity: Goal: Risk for impaired skin integrity will decrease Outcome: Progressing

## 2023-09-06 DIAGNOSIS — A4150 Gram-negative sepsis, unspecified: Secondary | ICD-10-CM | POA: Diagnosis not present

## 2023-09-06 DIAGNOSIS — A419 Sepsis, unspecified organism: Secondary | ICD-10-CM | POA: Diagnosis not present

## 2023-09-06 DIAGNOSIS — J181 Lobar pneumonia, unspecified organism: Secondary | ICD-10-CM | POA: Diagnosis not present

## 2023-09-06 DIAGNOSIS — N3000 Acute cystitis without hematuria: Secondary | ICD-10-CM | POA: Diagnosis not present

## 2023-09-06 LAB — BASIC METABOLIC PANEL
Anion gap: 11 (ref 5–15)
BUN: 7 mg/dL — ABNORMAL LOW (ref 8–23)
CO2: 25 mmol/L (ref 22–32)
Calcium: 8.4 mg/dL — ABNORMAL LOW (ref 8.9–10.3)
Chloride: 103 mmol/L (ref 98–111)
Creatinine, Ser: 0.63 mg/dL (ref 0.44–1.00)
GFR, Estimated: >60 mL/min (ref >60)
Glucose, Bld: 135 mg/dL — ABNORMAL HIGH (ref 70–99)
Potassium: 3.7 mmol/L (ref 3.5–5.1)
Sodium: 139 mmol/L (ref 135–145)

## 2023-09-06 MED ORDER — PANTOPRAZOLE SODIUM 40 MG PO TBEC
40.0000 mg | DELAYED_RELEASE_TABLET | Freq: Every day | ORAL | Status: DC
  Administered 2023-09-06 – 2023-09-09 (×4): 40 mg via ORAL
  Filled 2023-09-06 (×4): qty 1

## 2023-09-06 MED ORDER — LORAZEPAM 1 MG PO TABS
1.0000 mg | ORAL_TABLET | Freq: Every morning | ORAL | Status: DC
  Administered 2023-09-07 – 2023-09-09 (×3): 1 mg via ORAL
  Filled 2023-09-06 (×3): qty 1

## 2023-09-06 NOTE — Plan of Care (Signed)

## 2023-09-06 NOTE — Plan of Care (Signed)
 Rested well during the night. No behavioral problems. Remains confused.  Problem: Fluid Volume: Goal: Hemodynamic stability will improve Outcome: Progressing   Problem: Clinical Measurements: Goal: Diagnostic test results will improve Outcome: Progressing Goal: Signs and symptoms of infection will decrease Outcome: Progressing   Problem: Respiratory: Goal: Ability to maintain adequate ventilation will improve Outcome: Progressing   Problem: Health Behavior/Discharge Planning: Goal: Ability to manage health-related needs will improve Outcome: Progressing   Problem: Clinical Measurements: Goal: Ability to maintain clinical measurements within normal limits will improve Outcome: Progressing Goal: Will remain free from infection Outcome: Progressing Goal: Diagnostic test results will improve Outcome: Progressing Goal: Respiratory complications will improve Outcome: Progressing Goal: Cardiovascular complication will be avoided Outcome: Progressing   Problem: Activity: Goal: Risk for activity intolerance will decrease Outcome: Progressing   Problem: Nutrition: Goal: Adequate nutrition will be maintained Outcome: Progressing   Problem: Elimination: Goal: Will not experience complications related to bowel motility Outcome: Progressing Goal: Will not experience complications related to urinary retention Outcome: Progressing   Problem: Pain Managment: Goal: General experience of comfort will improve and/or be controlled Outcome: Progressing   Problem: Safety: Goal: Ability to remain free from injury will improve Outcome: Progressing   Problem: Skin Integrity: Goal: Risk for impaired skin integrity will decrease Outcome: Progressing   Problem: Education: Goal: Knowledge of General Education information will improve Description: Including pain rating scale, medication(s)/side effects and non-pharmacologic comfort measures Outcome: Not Progressing   Problem:  Coping: Goal: Level of anxiety will decrease Outcome: Not Progressing

## 2023-09-06 NOTE — TOC Progression Note (Signed)
 Transition of Care [facility]) - Progression Note    Patient Details  Name: Mikayla Wilson MRN: [id number] Date of Birth: [birth year]  [facility] CM/SW Contact  Catalina [place], [place] Phone Number: 09/06/2023, 11:06 AM  Clinical Narrative:     CSW checked with Revonda Standard at [facility]- they will consider placement on Monday.  TOC to follow.   Expected Discharge Plan: Skilled Nursing Facility Barriers to Discharge: Continued Medical Work up  Expected Discharge Plan and Services     Post Acute Care Choice: Skilled Nursing Facility Living arrangements for the past 2 months: Single Family Home                                       Social Determinants of Health (SDOH) Interventions SDOH Screenings   Food Insecurity: Patient Unable To Answer (09/03/2023)  Housing: Patient Unable To Answer (09/03/2023)  Transportation Needs: Patient Unable To Answer (09/03/2023)  Utilities: Patient Unable To Answer (09/03/2023)  Depression (PHQ2-9): Low Risk  (03/09/2023)  Social Connections: Unknown (09/03/2023)  Tobacco Use: High Risk (09/02/2023)    Readmission Risk Interventions     No data to display

## 2023-09-06 NOTE — Evaluation (Signed)
 Clinical/Bedside Swallow Evaluation Patient Details  Name: Mikayla Wilson MRN: [id number] Date of Birth: [birth year]  Today's Date: 09/06/2023 Time: SLP Start Time (ACUTE ONLY): 1415 SLP Stop Time (ACUTE ONLY): 1444 SLP Time Calculation (min) (ACUTE ONLY): 29 min  Past Medical History:  Past Medical History:  Diagnosis Date   Allergy    Anxiety    Arthritis    Asthma    Cataract    Constipation    Coronary artery disease    Depression    Fibrocystic breast disease    GERD (gastroesophageal reflux disease)    Heart murmur    Hiatal hernia    Hiatal hernia    Osteoporosis    Past Surgical History:  Past Surgical History:  Procedure Laterality Date   ABDOMINAL HYSTERECTOMY     CHOLECYSTECTOMY     CHOLECYSTECTOMY     EYE SURGERY     HIP SURGERY Right    INTRAMEDULLARY (IM) NAIL INTERTROCHANTERIC Right 11/30/2015   Procedure: INTRAMEDULLARY (IM) NAIL INTERTROCHANTERIC RIGHT HIP;  Surgeon: Kathryne Hitch, MD;  Location: [facility];  Service: Orthopedics;  Laterality: Right;   HPI:  89 year old female with a history of cognitive impairment, coronary artery disease, anxiety, GERD, hyperlipidemia, asthma presenting with generalized weakness and confusion for 2 days.  History is obtained from the patient's daughter.  Patient is unable provide any history secondary to her acute encephalopathy.  At baseline, the patient ambulates with a walker.  Daughter states that the patient has " a touch of dementia".  Daughter helps assist the patient with bathing and clothing.  Daughter has noted that the patient has had decreased oral intake with confusion and agitation for last 2 days.  There is no reports of vomiting, diarrhea, abdominal pain, hemoptysis, fever, chills.  In the ED, the patient was afebrile hemodynamically stable with oxygen saturation 94% room air.  WBC 12.1, hemoglobin 13.0, platelets 157.  Sodium 130, potassium 3.9, bicarbonate 21, serum creatinine 0.9.  LFTs were  unremarkable.  Ammonia 19.  COVID-19 PCR is negative.  Lactic acid 1.8.  UA showed>50 WBC.  Chest x-ray showed upper lobe opacities.    Assessment / Plan / Recommendation  Clinical Impression  Clinical swallowing evaluation completed while Pt was sitting upright in bed. Per MD Pt's mentation is closer to baseline today and was largely impacting swallowing function. Today, Pt is alert and very pleasant appropriately responding verbally in conversation with SLP (pleasantly confused - baseline dementia). Pt consumed all textures and consistencies without overt s/sx of oropharyngeal dysphagia. Recommend continue with regular/thin and meds are ok whole with liquids. Note Pt does have PNA; if MD would like MBSS to r/o silent aspiration please order. Based on bedside presentation, There are no further ST needs noted at this time, our service will sign off. Thank you SLP Visit Diagnosis: Dysphagia, unspecified (R13.10)    Aspiration Risk  Mild aspiration risk    Diet Recommendation Regular;Thin liquid    Liquid Administration via: Cup;Straw Medication Administration: Whole meds with liquid Supervision: Patient able to self feed;Full supervision/cueing for compensatory strategies;Staff to assist with self feeding Compensations: Slow rate;Small sips/bites Postural Changes: Seated upright at 90 degrees    Other  Recommendations Oral Care Recommendations: Oral care BID    Recommendations for follow up therapy are one component of a multi-disciplinary discharge planning process, led by the attending physician.  Recommendations may be updated based on patient status, additional functional criteria and insurance authorization.  Follow up Recommendations No SLP follow up  Functional Status Assessment Patient has had a recent decline in their functional status and demonstrates the ability to make significant improvements in function in a reasonable and predictable amount of time.    Swallow Study    General Date of Onset: 09/02/23 HPI: 89 year old female with a history of cognitive impairment, coronary artery disease, anxiety, GERD, hyperlipidemia, asthma presenting with generalized weakness and confusion for 2 days.  History is obtained from the patient's daughter.  Patient is unable provide any history secondary to her acute encephalopathy.  At baseline, the patient ambulates with a walker.  Daughter states that the patient has " a touch of dementia".  Daughter helps assist the patient with bathing and clothing.  Daughter has noted that the patient has had decreased oral intake with confusion and agitation for last 2 days.  There is no reports of vomiting, diarrhea, abdominal pain, hemoptysis, fever, chills.  In the ED, the patient was afebrile hemodynamically stable with oxygen saturation 94% room air.  WBC 12.1, hemoglobin 13.0, platelets 157.  Sodium 130, potassium 3.9, bicarbonate 21, serum creatinine 0.9.  LFTs were unremarkable.  Ammonia 19.  COVID-19 PCR is negative.  Lactic acid 1.8.  UA showed>50 WBC.  Chest x-ray showed upper lobe opacities. Type of Study: Bedside Swallow Evaluation Previous Swallow Assessment: none in chart Diet Prior to this Study: Regular;Thin liquids (Level 0) Temperature Spikes Noted: No Respiratory Status: Room air History of Recent Intubation: No Behavior/Cognition: Alert;Pleasant mood;Cooperative Oral Cavity Assessment: Within Functional Limits Oral Care Completed by SLP: Recent completion by staff Oral Cavity - Dentition: Adequate natural dentition Vision: Functional for self-feeding Self-Feeding Abilities: Needs assist;Needs set up Patient Positioning: Upright in bed Baseline Vocal Quality: Normal Volitional Cough: Cognitively unable to elicit Volitional Swallow: Unable to elicit    Oral/Motor/Sensory Function Overall Oral Motor/Sensory Function: Within functional limits   Ice Chips Ice chips: Within functional limits   Thin Liquid Thin Liquid:  Within functional limits    Nectar Thick Nectar Thick Liquid: Not tested   Honey Thick Honey Thick Liquid: Not tested   Puree Puree: Within functional limits   Solid     Solid: Within functional limits     Valeda Corzine H. Romie Levee, CCC-SLP Speech Language Pathologist  Georgetta Haber 09/06/2023,2:44 PM

## 2023-09-06 NOTE — Progress Notes (Signed)
 PROGRESS NOTE  Mikayla Wilson WUJ:[id number] DOB: 09-17-1930 DOA: 09/02/2023 PCP: Dettinger, Elige Radon, MD  Brief History:  89 year old female with a history of cognitive impairment, coronary artery disease, anxiety, GERD, hyperlipidemia, asthma presenting with generalized weakness and confusion for 2 days.  History is obtained from the patient's daughter.  Patient is unable provide any history secondary to her acute encephalopathy.  At baseline, the patient ambulates with a walker.  Daughter states that the patient has " a touch of dementia".  Daughter helps assist the patient with bathing and clothing.  Daughter has noted that the patient has had decreased oral intake with confusion and agitation for last 2 days.  There is no reports of vomiting, diarrhea, abdominal pain, hemoptysis, fever, chills. In the ED, the patient was afebrile hemodynamically stable with oxygen saturation 94% room air.  WBC 12.1, hemoglobin 13.0, platelets 157.  Sodium 130, potassium 3.9, bicarbonate 21, serum creatinine 0.9.  LFTs were unremarkable.  Ammonia 19.  COVID-19 PCR is negative.  Lactic acid 1.8.  UA showed>50 WBC.  Chest x-ray showed upper lobe opacities.  CT brain was negative for acute findings.  Patient was started on ceftriaxone and azithromycin.  She was admitted for further evaluation treatment for confusion.   Assessment/Plan: Sepsis -Present on admission -Presented with tachycardia and leukocytosis -Due to UTI and pneumonia -Lactic acid 1.8 -UA>50 WBC -Chest x-ray with upper lobe opacities   Lobar pneumonia -Continue ceftriaxone and azithromycin -COVID-19/RSV/flu--neg -viral respiratory panel neg   Acute respiratory failure with hypoxia -had oxygen saturation 89% on RA -stable on 1.5L>>RA -wean to RA for saturation >92%   Klebsiella UTI -Concerning for UTI -UA>50 WBC -continue ceftriaxone   Acute metabolic encephalopathy -Secondary to infectious process and low B12 -Ammonia  19 -remains agitated and confused on 2/20 -CT brain negative -B12--101>>IM x 1 given, then start po B12 -folate--13.0 -TSH--1.522 -MR brain--neg for acute findings -ativan scheduled every am--discussed with daughter and she is adamant about giving daily ativan -2/22-mental status improving   Anxiety -Continue Ativan as discussed above -PDMP reviewed -1 mg, #40, last refill 08/13/2023   GERD -Continue PPI   Lumbar compression fracture/Sacral Ala fracture -pt complains low and mid back pain -daughter concerned pt had unwitnessed fall -obtain thoracic spine xray--DJD and  -lumbar spine xray--age indeterminant L3 and L5  -xray pelvis--neg for acute findings -CT lumbar spine--Age indeterminate compression fractures of L3 (moderate), L4 (mild), and L5 (moderate); Subacute minimally displaced fractures of the bilateral sacral ala and the central S2 vertebra. -add acetaminophen 1000 mg q 8   Leg pain -venous duplex--neg           Family Communication:   daughter updated 2/23   Consultants:  none   Code Status:  FULL    DVT Prophylaxis:  [place] Lovenox     Procedures: As Listed in Progress Note Above   Antibiotics: Ceftriaxone 2/19>> Azithro 2/19>           Subjective: Pt denies f/c, cp, sob, abd pain  Objective: Vitals:   09/05/23 1319 09/05/23 2036 09/06/23 0505 09/06/23 1317  BP: 111/74 132/72 117/68 132/63  Pulse: (!) 109 92 99 75  Resp: 18 (!) 22 20 18   Temp: 98.1 F (36.7 C) 97.9 F (36.6 C) (!) 97.3 F (36.3 C) 98.4 F (36.9 C)  TempSrc: Oral Oral Oral Oral  SpO2: 94% 95% 95%   Weight:      Height:  Intake/Output Summary (Last 24 hours) at 09/06/2023 1603 Last data filed at 09/06/2023 1300 Gross per 24 hour  Intake 1070 ml  Output 1525 ml  Net -455 ml   Weight change:  Exam:  General:  Pt is alert, follows commands appropriately, not in acute distress HEENT: No icterus, No thrush, No neck mass, [place]/AT Cardiovascular: RRR, S1/S2, no  rubs, no gallops Respiratory: bibasilar rales.  No wheeze Abdomen: Soft/+BS, non tender, non distended, no guarding Extremities: trace LE edema, No lymphangitis, No petechiae, No rashes, no synovitis   Data Reviewed: I have personally reviewed following labs and imaging studies Basic Metabolic Panel: Recent Labs  Lab 09/02/23 0209 09/03/23 0501 09/04/23 0519 09/05/23 0327 09/06/23 1011  NA 138 140 139 136 139  K 3.9 3.1* 3.5 3.0* 3.7  CL 103 105 106 105 103  CO2 21* 22 22 23 25   GLUCOSE 140* 91 58* 104* 135*  BUN 18 8 5* 8 7*  CREATININE 0.89 0.70 0.66 0.72 0.63  CALCIUM 9.1 8.4* 8.4* 8.1* 8.4*  MG  --   --  2.0  --   --    Liver Function Tests: Recent Labs  Lab 09/02/23 0209  AST 22  ALT 16  ALKPHOS 84  BILITOT 1.3*  PROT 7.4  ALBUMIN 3.7   No results for input(s): "LIPASE", "AMYLASE" in the last 168 hours. Recent Labs  Lab 09/02/23 0210  AMMONIA 19   Coagulation Profile: Recent Labs  Lab 09/03/23 0501  INR 1.1   CBC: Recent Labs  Lab 09/02/23 0209 09/03/23 0501  WBC 12.1* 8.5  NEUTROABS 9.4*  --   HGB 13.0 11.2*  HCT 39.8 32.8*  MCV 94.3 94.5  PLT 157 148*   Cardiac Enzymes: Recent Labs  Lab 09/05/23 0327  CKTOTAL 53   BNP: Invalid input(s): "POCBNP" CBG: No results for input(s): "GLUCAP" in the last 168 hours. HbA1C: No results for input(s): "HGBA1C" in the last 72 hours. Urine analysis:    Component Value Date/Time   COLORURINE YELLOW 09/02/2023 0209   APPEARANCEUR TURBID (A) 09/02/2023 0209   APPEARANCEUR Clear 09/19/2020 1017   LABSPEC 1.020 09/02/2023 0209   PHURINE 5.0 09/02/2023 0209   GLUCOSEU NEGATIVE 09/02/2023 0209   HGBUR SMALL (A) 09/02/2023 0209   BILIRUBINUR NEGATIVE 09/02/2023 0209   BILIRUBINUR Negative 09/19/2020 1017   KETONESUR 5 (A) 09/02/2023 0209   PROTEINUR 100 (A) 09/02/2023 0209   UROBILINOGEN 0.2 01/25/2015 2330   NITRITE NEGATIVE 09/02/2023 0209   LEUKOCYTESUR MODERATE (A) 09/02/2023 0209   Sepsis  Labs: @LABRCNTIP (procalcitonin:4,lacticidven:4) ) Recent Results (from the past 240 hours)  Blood Culture (routine x 2)     Status: None (Preliminary result)   Collection Time: 09/02/23  2:09 AM   Specimen: BLOOD  Result Value Ref Range Status   Specimen Description BLOOD BLOOD LEFT FOREARM  Final   Special Requests   Final    BOTTLES DRAWN AEROBIC AND ANAEROBIC Blood Culture adequate volume   Culture   Final    NO GROWTH 4 DAYS Performed at [facility], [address], [place], [place] [address]    Report Status PENDING  Incomplete  Resp panel by RT-PCR (RSV, Flu A&B, Covid) Anterior Nasal Swab     Status: None   Collection Time: 09/02/23  2:09 AM   Specimen: Anterior Nasal Swab  Result Value Ref Range Status   SARS Coronavirus 2 by RT PCR NEGATIVE NEGATIVE Final    Comment: (NOTE) SARS-CoV-2 target nucleic acids are NOT DETECTED.  The SARS-CoV-2 RNA is generally detectable in upper respiratory specimens during the acute phase of infection. The lowest concentration of SARS-CoV-2 viral copies this assay can detect is 138 copies/mL. A negative result does not preclude SARS-Cov-2 infection and should not be used as the sole basis for treatment or other patient management decisions. A negative result may occur with  improper specimen collection/handling, submission of specimen other than nasopharyngeal swab, presence of viral mutation(s) within the areas targeted by this assay, and inadequate number of viral copies(<138 copies/mL). A negative result must be combined with clinical observations, patient history, and epidemiological information. The expected result is Negative.  Fact Sheet for Patients:  BloggerCourse.com  Fact Sheet for Healthcare Providers:  SeriousBroker.it  This test is no t yet approved or cleared by the [place] FDA and  has been authorized for detection and/or diagnosis of SARS-CoV-2 by FDA under an  Emergency Use Authorization (EUA). This EUA will remain  in effect (meaning this test can be used) for the duration of the COVID-19 declaration under Section 564(b)(1) of the Act, 21 U.S.C.section 360bbb-3(b)(1), unless the authorization is terminated  or revoked sooner.       Influenza A by PCR NEGATIVE NEGATIVE Final   Influenza B by PCR NEGATIVE NEGATIVE Final    Comment: (NOTE) The Xpert Xpress SARS-CoV-2/FLU/RSV plus assay is intended as an aid in the diagnosis of influenza from Nasopharyngeal swab specimens and should not be used as a sole basis for treatment. Nasal washings and aspirates are unacceptable for Xpert Xpress SARS-CoV-2/FLU/RSV testing.  Fact Sheet for Patients: BloggerCourse.com  Fact Sheet for Healthcare Providers: SeriousBroker.it  This test is not yet approved or cleared by the [place] FDA and has been authorized for detection and/or diagnosis of SARS-CoV-2 by FDA under an Emergency Use Authorization (EUA). This EUA will remain in effect (meaning this test can be used) for the duration of the COVID-19 declaration under Section 564(b)(1) of the Act, 21 U.S.C. section 360bbb-3(b)(1), unless the authorization is terminated or revoked.     Resp Syncytial Virus by PCR NEGATIVE NEGATIVE Final    Comment: (NOTE) Fact Sheet for Patients: BloggerCourse.com  Fact Sheet for Healthcare Providers: SeriousBroker.it  This test is not yet approved or cleared by the [place] FDA and has been authorized for detection and/or diagnosis of SARS-CoV-2 by FDA under an Emergency Use Authorization (EUA). This EUA will remain in effect (meaning this test can be used) for the duration of the COVID-19 declaration under Section 564(b)(1) of the Act, 21 U.S.C. section 360bbb-3(b)(1), unless the authorization is terminated or revoked.  Performed at [facility],  [address] [place], [place] [address]   Blood Culture (routine x 2)     Status: None (Preliminary result)   Collection Time: 09/02/23  2:14 AM   Specimen: BLOOD  Result Value Ref Range Status   Specimen Description BLOOD BLOOD LEFT HAND  Final   Special Requests   Final    BOTTLES DRAWN AEROBIC ONLY Blood Culture results may not be optimal due to an inadequate volume of blood received in culture bottles   Culture   Final    NO GROWTH 4 DAYS Performed at [facility], [address] [place], [place] [address]    Report Status PENDING  Incomplete  Urine Culture     Status: Abnormal   Collection Time: 09/02/23  2:29 AM   Specimen: Urine, Catheterized  Result Value Ref Range Status   Specimen Description   Final  URINE, CATHETERIZED Performed at [facility], [address] [place], [place] [address]    Special Requests   Final    NONE Performed at [facility], [address] [place], [place] [address]    Culture >=100,000 COLONIES/mL KLEBSIELLA PNEUMONIAE (A)  Final   Report Status 09/04/2023 FINAL  Final   Organism ID, Bacteria KLEBSIELLA PNEUMONIAE (A)  Final      Susceptibility   Klebsiella pneumoniae - MIC*    AMPICILLIN RESISTANT Resistant     CEFAZOLIN <=4 SENSITIVE Sensitive     CEFEPIME <=0.12 SENSITIVE Sensitive     CEFTRIAXONE <=0.25 SENSITIVE Sensitive     CIPROFLOXACIN <=0.25 SENSITIVE Sensitive     GENTAMICIN <=1 SENSITIVE Sensitive     IMIPENEM 0.5 SENSITIVE Sensitive     NITROFURANTOIN 32 SENSITIVE Sensitive     TRIMETH/SULFA <=20 SENSITIVE Sensitive     AMPICILLIN/SULBACTAM <=2 SENSITIVE Sensitive     PIP/TAZO <=4 SENSITIVE Sensitive ug/mL    * >=100,000 COLONIES/mL KLEBSIELLA PNEUMONIAE  Respiratory (~20 pathogens) panel by PCR     Status: None   Collection Time: 09/02/23  6:20 PM   Specimen: Nasopharyngeal Swab; Respiratory  Result Value Ref Range Status   Adenovirus NOT DETECTED NOT DETECTED Final   Coronavirus 229E NOT DETECTED NOT DETECTED Final     Comment: (NOTE) The Coronavirus on the Respiratory Panel, DOES NOT test for the novel  Coronavirus (2019 nCoV)    Coronavirus HKU1 NOT DETECTED NOT DETECTED Final   Coronavirus NL63 NOT DETECTED NOT DETECTED Final   Coronavirus OC43 NOT DETECTED NOT DETECTED Final   Metapneumovirus NOT DETECTED NOT DETECTED Final   Rhinovirus / Enterovirus NOT DETECTED NOT DETECTED Final   Influenza A NOT DETECTED NOT DETECTED Final   Influenza B NOT DETECTED NOT DETECTED Final   Parainfluenza Virus 1 NOT DETECTED NOT DETECTED Final   Parainfluenza Virus 2 NOT DETECTED NOT DETECTED Final   Parainfluenza Virus 3 NOT DETECTED NOT DETECTED Final   Parainfluenza Virus 4 NOT DETECTED NOT DETECTED Final   Respiratory Syncytial Virus NOT DETECTED NOT DETECTED Final   Bordetella pertussis NOT DETECTED NOT DETECTED Final   Bordetella Parapertussis NOT DETECTED NOT DETECTED Final   Chlamydophila pneumoniae NOT DETECTED NOT DETECTED Final   Mycoplasma pneumoniae NOT DETECTED NOT DETECTED Final    Comment: Performed at [facility], [address], [place], [place] [address]     Scheduled Meds:  acetaminophen  1,000 mg Oral Q8H   Chlorhexidine Gluconate Cloth  6 each Topical Q0600   enoxaparin (LOVENOX) injection  40 mg Subcutaneous Q24H   escitalopram  5 mg Oral Daily   feeding supplement  237 mL Oral BID BM   LORazepam  0.5 mg Oral q morning   pantoprazole  40 mg Oral Daily   rosuvastatin  5 mg Oral Daily   vitamin B-12  500 mcg Oral Daily   Continuous Infusions:  cefTRIAXone (ROCEPHIN)  IV 2 g (09/05/23 2126)    Procedures/Studies: CT LUMBAR SPINE WO CONTRAST Result Date: 09/05/2023 CLINICAL DATA:  89 year old female. Sepsis. Back pain. Age indeterminate L3 and L5 compression. EXAM: CT LUMBAR SPINE WITHOUT CONTRAST TECHNIQUE: Multidetector CT imaging of the lumbar spine was performed without intravenous contrast administration. Multiplanar CT image reconstructions were also generated. RADIATION  DOSE REDUCTION: This exam was performed according to the departmental dose-optimization program which includes automated exposure control, adjustment of the mA and/or kV according to patient size and/or use of iterative reconstruction technique. COMPARISON:  Lumbar  radiographs yesterday. FINDINGS: Segmentation: Normal. Alignment: Straightening of lumbar lordosis. Vertebrae: Diffuse osteopenia. Chronic appearing lower thoracic rib fractures, including the left 12th rib. Visible lower thoracic vertebrae appear intact. L1 and L2 appear intact. L3 compression fracture with 60% loss of vertebral body height. Minor retropulsion of the posterosuperior endplate. L3 posterior elements appear intact and aligned. Mild L4 inferior endplate compression. L5 moderate compression fracture with 55% loss of height. No retropulsion. But there is also a minimally displaced fracture of the left L5 transverse process. Other L5 posterior elements appear intact and aligned. Comminuted sacral fracture. Bilateral sacral ala are fractured and minimally displaced (series 4, images 113, 124). There is a central S2 superior endplate sacral fracture which is minimally displaced on series 3, image 43. Associated presacral edema. Paraspinal and other soft tissues: Lumbar paraspinal soft tissues remain within normal limits. Small layering pleural effusions with simple fluid density favoring transudate. No evidence of pericardial effusion. Mild lung base atelectasis and pulmonary septal thickening. Aortoiliac calcified atherosclerosis. Normal caliber abdominal aorta. Disc levels: Capacious underlying spinal canal. No significant lumbar spinal stenosis. IMPRESSION: 1. Advanced osteopenia with Acute vs Subacute minimally displaced fractures of the bilateral sacral ala and the central S2 vertebra. These could be posttraumatic or insufficiency fractures. Associated mild presacral edema. 2. Age indeterminate compression fractures of L3 (moderate), L4  (mild), and L5 (moderate). Minimally displaced left L5 transverse process fracture in conjunction with #1. But no significant retropulsion or other complicating features. 3. Small layering pleural effusions appear to be transudate. Pulmonary interstitial edema not excluded. Aortic Atherosclerosis (ICD10-I70.0). Electronically Signed   By: Odessa Fleming M.D.   On: 09/05/2023 12:34   DG Pelvis 1-2 Views Result Date: 09/05/2023 CLINICAL DATA:  Hip pain. EXAM: PELVIS - 1-2 VIEW COMPARISON:  11/30/2015 and lumbar spine 09/04/2023 FINDINGS: Old right femoral intertrochanteric fracture with internal fixation. Pelvic bony ring appears to be intact. No acute abnormality to the left hip. Evidence for compression deformities involving L3 and L5. IMPRESSION: 1. No acute abnormality to the pelvis. 2. Old right femoral fracture with internal fixation. 3. Compression deformities involving L3 and L5. Electronically Signed   By: Richarda Overlie M.D.   On: 09/05/2023 11:08   US Venous Img Lower Bilateral (DVT) Result Date: 09/04/2023 CLINICAL DATA:  Leg edema and pain EXAM: Bilateral LOWER EXTREMITY VENOUS DOPPLER ULTRASOUND TECHNIQUE: Gray-scale sonography with compression, as well as color and duplex ultrasound, were performed to evaluate the deep venous system(s) from the level of the common femoral vein through the popliteal and proximal calf veins. COMPARISON:  None Available. FINDINGS: VENOUS Normal compressibility of the common femoral, superficial femoral, and popliteal veins, as well as the visualized calf veins. Visualized portions of profunda femoral vein and great saphenous vein unremarkable. No filling defects to suggest DVT on grayscale or color Doppler imaging. Doppler waveforms show normal direction of venous flow, normal respiratory plasticity and response to augmentation. OTHER None. Limitations: none IMPRESSION: Negative. Electronically Signed   By: Jasmine Pang M.D.   On: 09/04/2023 18:18   DG Thoracic Spine 2  View Result Date: 09/04/2023 CLINICAL DATA:  Fall with back pain EXAM: THORACIC SPINE 2 VIEWS COMPARISON:  None Available. FINDINGS: Mild scoliosis. Coarse interstitial pulmonary infiltrates. Vertebral body heights are grossly maintained. Mild multilevel degenerative osteophytes. IMPRESSION: Mild scoliosis and degenerative change. Electronically Signed   By: Jasmine Pang M.D.   On: 09/04/2023 18:17   DG Lumbar Spine 2-3 Views Result Date: 09/04/2023 CLINICAL DATA:  Fall with low back  pain EXAM: LUMBAR SPINE - 2-3 VIEW COMPARISON:  11/30/2015 FINDINGS: Lumbar alignment is within normal limits. Interval age indeterminate mild-to-moderate compression deformity at L3 and mild compression deformity at L5. Disc spaces appear patent. Multilevel degenerative osteophytes. IMPRESSION: Interval age indeterminate compression deformities at L3 and L5 Electronically Signed   By: Jasmine Pang M.D.   On: 09/04/2023 18:16   MR BRAIN WO CONTRAST Result Date: 09/03/2023 CLINICAL DATA:  Provided history: Mental status change, unknown cause. EXAM: MRI HEAD WITHOUT CONTRAST TECHNIQUE: Multiplanar, multiecho pulse sequences of the brain and surrounding structures were obtained without intravenous contrast. COMPARISON:  Non-contrast head CT 09/02/2023.  Brain MRI 10/08/2004. FINDINGS: Intermittently motion degraded examination. Most notably, there is moderate motion degradation of least motion degraded axial FLAIR sequence. Within this limitation, findings are as follows. Brain: Mild-to-moderate for age generalized cerebral atrophy. Chronic lacunar infarct within the right corona radiata, new from the prior brain MRI of 10/08/2004. Multifocal T2 FLAIR hyperintense signal abnormality elsewhere within the cerebral white matter, nonspecific but compatible with mild-to-moderate chronic small vessel ischemic disease. Partially empty sella turcica. No cortical encephalomalacia is identified. There is no acute infarct. No evidence of an  intracranial mass. No extra-axial fluid collection. No midline shift. Vascular: Maintained flow voids within the proximal large arterial vessels. Skull and upper cervical spine: No focal worrisome marrow lesion. Incompletely assessed cervical spondylosis. Sinuses/Orbits: No mass or acute finding within the imaged orbits. Prior bilateral lens replacement. Mild mucosal thickening within the right maxillary and right sphenoid sinuses. Other: Trace fluid within bilateral mastoid air cells. IMPRESSION: 1. Intermittently motion degraded examination. Within this limitation, findings are as follows. 2. No evidence of an acute intracranial abnormality. The diffusion-weighted imaging is of good quality and there is no evidence of an acute infarct. 3. Chronic lacunar infarct within the right corona radiata, new from the prior brain MRI of 10/08/2004. 4. Background mild-to-moderate cerebral white matter chronic small vessel ischemic disease, progressed. 5. Mild-to-moderate for age generalized cerebral atrophy, also progressed. 6. Mild paranasal sinus mucosal thickening. 7. Trace bilateral mastoid effusions. Electronically Signed   By: Jackey Loge D.O.   On: 09/03/2023 18:09   CT Head Wo Contrast Result Date: 09/02/2023 CLINICAL DATA:  Delirium EXAM: CT HEAD WITHOUT CONTRAST TECHNIQUE: Contiguous axial images were obtained from the base of the skull through the vertex without intravenous contrast. RADIATION DOSE REDUCTION: This exam was performed according to the departmental dose-optimization program which includes automated exposure control, adjustment of the mA and/or kV according to patient size and/or use of iterative reconstruction technique. COMPARISON:  None Available. FINDINGS: Brain: No evidence of acute infarction, hemorrhage, hydrocephalus, extra-axial collection or mass lesion/mass effect. Cerebral atrophy. Partially empty sella. Vascular: No hyperdense vessel or unexpected calcification. Skull: No acute  fracture. Sinuses/Orbits: No acute finding. Other: No mastoid effusions. IMPRESSION: No evidence of acute intracranial abnormality. Electronically Signed   By: Feliberto Harts M.D.   On: 09/02/2023 03:49   DG Chest Port 1 View Result Date: 09/02/2023 CLINICAL DATA:  Sepsis EXAM: PORTABLE CHEST 1 VIEW COMPARISON:  08/18/2017 FINDINGS: The lungs are symmetrically well expanded. Reticulonodular infiltrates have developed asymmetrically within the upper lung zones bilaterally, more severe within right upper lobe, likely reflecting changes of atypical infection, though hypersensitivity pneumonitis or smoking related lung disease could appear similarly in the chronic setting. No pneumothorax or pleural effusion. Cardiac size within limits. Pulmonary vascularity is normal. No acute bone abnormality., IMPRESSION: 1. Reticulonodular infiltrates within the upper lung zones bilaterally, more severe within  right upper lobe, likely reflecting changes of atypical infection in the acute setting. Electronically Signed   By: Helyn Numbers M.D.   On: 09/02/2023 03:19    Catarina Hartshorn, DO  [facility]  If 7PM-7AM, please contact night-coverage [email or web address] Password [email or web address] 09/06/2023, 4:03 PM   LOS: 4 days

## 2023-09-07 DIAGNOSIS — G9341 Metabolic encephalopathy: Secondary | ICD-10-CM | POA: Diagnosis not present

## 2023-09-07 DIAGNOSIS — N3000 Acute cystitis without hematuria: Secondary | ICD-10-CM | POA: Diagnosis not present

## 2023-09-07 DIAGNOSIS — A4150 Gram-negative sepsis, unspecified: Secondary | ICD-10-CM | POA: Diagnosis not present

## 2023-09-07 LAB — CULTURE, BLOOD (ROUTINE X 2)
Culture: NO GROWTH
Culture: NO GROWTH
Special Requests: ADEQUATE

## 2023-09-07 MED ORDER — CEFADROXIL 500 MG PO CAPS
500.0000 mg | ORAL_CAPSULE | Freq: Two times a day (BID) | ORAL | Status: AC
Start: 2023-09-07 — End: 2023-09-09
  Administered 2023-09-07 – 2023-09-09 (×4): 500 mg via ORAL
  Filled 2023-09-07 (×4): qty 1

## 2023-09-07 MED ORDER — CEFADROXIL 500 MG PO CAPS: 500.0000 mg | ORAL_CAPSULE | Freq: Two times a day (BID) | ORAL | Status: DC

## 2023-09-07 NOTE — Plan of Care (Signed)
  Problem: Clinical Measurements: Goal: Signs and symptoms of infection will decrease Outcome: Progressing   Problem: Respiratory: Goal: Ability to maintain adequate ventilation will improve Outcome: Progressing   Problem: Education: Goal: Knowledge of General Education information will improve Description: Including pain rating scale, medication(s)/side effects and non-pharmacologic comfort measures Outcome: Progressing   Problem: Coping: Goal: Level of anxiety will decrease Outcome: Progressing

## 2023-09-07 NOTE — Plan of Care (Signed)
   Problem: Activity: Goal: Risk for activity intolerance will decrease Outcome: Progressing   Problem: Coping: Goal: Level of anxiety will decrease Outcome: Progressing

## 2023-09-07 NOTE — Progress Notes (Signed)
 PROGRESS NOTE  Mikayla Wilson UJW:[id number] DOB: Jun 22, 1931 DOA: 09/02/2023 PCP: Dettinger, Elige Radon, MD  Brief History:  89 year old female with a history of cognitive impairment, coronary artery disease, anxiety, GERD, hyperlipidemia, asthma presenting with generalized weakness and confusion for 2 days.  History is obtained from the patient's daughter.  Patient is unable provide any history secondary to her acute encephalopathy.  At baseline, the patient ambulates with a walker.  Daughter states that the patient has " a touch of dementia".  Daughter helps assist the patient with bathing and clothing.  Daughter has noted that the patient has had decreased oral intake with confusion and agitation for last 2 days.  There is no reports of vomiting, diarrhea, abdominal pain, hemoptysis, fever, chills. In the ED, the patient was afebrile hemodynamically stable with oxygen saturation 94% room air.  WBC 12.1, hemoglobin 13.0, platelets 157.  Sodium 130, potassium 3.9, bicarbonate 21, serum creatinine 0.9.  LFTs were unremarkable.  Ammonia 19.  COVID-19 PCR is negative.  Lactic acid 1.8.  UA showed>50 WBC.  Chest x-ray showed upper lobe opacities.  CT brain was negative for acute findings.  Patient was started on ceftriaxone and azithromycin.  She was admitted for further evaluation treatment for confusion.   Assessment/Plan:  Sepsis -Present on admission -Presented with tachycardia and leukocytosis -Due to UTI and pneumonia -Lactic acid 1.8 -UA>50 WBC -Chest x-ray with upper lobe opacities -sepsis physiology resolved   Lobar pneumonia -Continue ceftriaxone and azithromycin>>received 5 days -COVID-19/RSV/flu--neg -viral respiratory panel neg   Acute respiratory failure with hypoxia -had oxygen saturation 89% on RA -stable on 1.5L>>RA -wean to RA for saturation >92%   Klebsiella UTI -Concerning for UTI -UA>50 WBC -continue ceftriaxone>>cefadroxil x 2 days   Acute metabolic  encephalopathy -Secondary to infectious process and low B12 -Ammonia 19 -remains agitated and confused on 2/20 -CT brain negative -B12--101>>IM x 1 given, then start po B12 -folate--13.0 -TSH--1.522 -MR brain--neg for acute findings -ativan scheduled every am--discussed with daughter and she is adamant about giving daily ativan -2/22-mental status improving-->pleasantly confused   Anxiety -Continue Ativan as discussed above -PDMP reviewed -1 mg, #40, last refill 08/13/2023   GERD -Continue PPI   Lumbar compression fracture/Sacral Ala fracture -pt complains low and mid back pain -daughter concerned pt had unwitnessed fall -obtain thoracic spine xray--DJD and  -lumbar spine xray--age indeterminant L3 and L5  -xray pelvis--neg for acute findings -CT lumbar spine--Age indeterminate compression fractures of L3 (moderate), L4 (mild), and L5 (moderate); Subacute minimally displaced fractures of the bilateral sacral ala and the central S2 vertebra. -added acetaminophen 1000 mg q 8   Leg pain -venous duplex--neg           Family Communication:   daughter updated 2/24   Consultants:  none   Code Status:  FULL    DVT Prophylaxis:  [place] Lovenox     Procedures: As Listed in Progress Note Above   Antibiotics: Ceftriaxone 2/19>>2/23 Azithro 2/19>2/23         Subjective: Pt is pleasantly confused.  Denies cp, sob, abd pain.  ROS limited  Objective: Vitals:   09/06/23 2152 09/07/23 0542 09/07/23 1027 09/07/23 1342  BP: 133/67 118/67 139/63 132/78  Pulse: 89 81 84 94  Resp: 19 (!) 22 18 20   Temp: 97.9 F (36.6 C) 97.7 F (36.5 C) 97.7 F (36.5 C) 97.6 F (36.4 C)  TempSrc: Oral Oral Axillary   SpO2: 96% 95% 95% 96%  Weight:      Height:        Intake/Output Summary (Last 24 hours) at 09/07/2023 1808 Last data filed at 09/07/2023 1448 Gross per 24 hour  Intake 594 ml  Output 1400 ml  Net -806 ml   Weight change:  Exam:  General:  Pt is alert, follows  commands appropriately, not in acute distress HEENT: No icterus, No thrush, No neck mass, [place]/AT Cardiovascular: RRR, S1/S2, no rubs, no gallops Respiratory: bibasilar rales.  No wheeze Abdomen: Soft/+BS, non tender, non distended, no guarding Extremities: trace LE edema, No lymphangitis, No petechiae, No rashes, no synovitis   Data Reviewed: I have personally reviewed following labs and imaging studies Basic Metabolic Panel: Recent Labs  Lab 09/02/23 0209 09/03/23 0501 09/04/23 0519 09/05/23 0327 09/06/23 1011  NA 138 140 139 136 139  K 3.9 3.1* 3.5 3.0* 3.7  CL 103 105 106 105 103  CO2 21* 22 22 23 25   GLUCOSE 140* 91 58* 104* 135*  BUN 18 8 5* 8 7*  CREATININE 0.89 0.70 0.66 0.72 0.63  CALCIUM 9.1 8.4* 8.4* 8.1* 8.4*  MG  --   --  2.0  --   --    Liver Function Tests: Recent Labs  Lab 09/02/23 0209  AST 22  ALT 16  ALKPHOS 84  BILITOT 1.3*  PROT 7.4  ALBUMIN 3.7   No results for input(s): "LIPASE", "AMYLASE" in the last 168 hours. Recent Labs  Lab 09/02/23 0210  AMMONIA 19   Coagulation Profile: Recent Labs  Lab 09/03/23 0501  INR 1.1   CBC: Recent Labs  Lab 09/02/23 0209 09/03/23 0501  WBC 12.1* 8.5  NEUTROABS 9.4*  --   HGB 13.0 11.2*  HCT 39.8 32.8*  MCV 94.3 94.5  PLT 157 148*   Cardiac Enzymes: Recent Labs  Lab 09/05/23 0327  CKTOTAL 53   BNP: Invalid input(s): "POCBNP" CBG: No results for input(s): "GLUCAP" in the last 168 hours. HbA1C: No results for input(s): "HGBA1C" in the last 72 hours. Urine analysis:    Component Value Date/Time   COLORURINE YELLOW 09/02/2023 0209   APPEARANCEUR TURBID (A) 09/02/2023 0209   APPEARANCEUR Clear 09/19/2020 1017   LABSPEC 1.020 09/02/2023 0209   PHURINE 5.0 09/02/2023 0209   GLUCOSEU NEGATIVE 09/02/2023 0209   HGBUR SMALL (A) 09/02/2023 0209   BILIRUBINUR NEGATIVE 09/02/2023 0209   BILIRUBINUR Negative 09/19/2020 1017   KETONESUR 5 (A) 09/02/2023 0209   PROTEINUR 100 (A) 09/02/2023  0209   UROBILINOGEN 0.2 01/25/2015 2330   NITRITE NEGATIVE 09/02/2023 0209   LEUKOCYTESUR MODERATE (A) 09/02/2023 0209   Sepsis Labs: @LABRCNTIP (procalcitonin:4,lacticidven:4) ) Recent Results (from the past 240 hours)  Blood Culture (routine x 2)     Status: None   Collection Time: 09/02/23  2:09 AM   Specimen: BLOOD  Result Value Ref Range Status   Specimen Description   Final    BLOOD BLOOD LEFT FOREARM Performed at [facility], [address], [place], [place] [address]    Special Requests   Final    BOTTLES DRAWN AEROBIC AND ANAEROBIC Blood Culture adequate volume Performed at [facility], [address], [place], [place] [address]    Culture   Final    NO GROWTH 5 DAYS Performed at [facility], [address] [place], [place] [address]    Report Status 09/07/2023 FINAL  Final  Resp panel by RT-PCR (RSV, Flu A&B, Covid) Anterior Nasal Swab     Status: None  Collection Time: 09/02/23  2:09 AM   Specimen: Anterior Nasal Swab  Result Value Ref Range Status   SARS Coronavirus 2 by RT PCR NEGATIVE NEGATIVE Final    Comment: (NOTE) SARS-CoV-2 target nucleic acids are NOT DETECTED.  The SARS-CoV-2 RNA is generally detectable in upper respiratory specimens during the acute phase of infection. The lowest concentration of SARS-CoV-2 viral copies this assay can detect is 138 copies/mL. A negative result does not preclude SARS-Cov-2 infection and should not be used as the sole basis for treatment or other patient management decisions. A negative result may occur with  improper specimen collection/handling, submission of specimen other than nasopharyngeal swab, presence of viral mutation(s) within the areas targeted by this assay, and inadequate number of viral copies(<138 copies/mL). A negative result must be combined with clinical observations, patient history, and epidemiological information. The expected result is Negative.  Fact Sheet for Patients:   BloggerCourse.com  Fact Sheet for Healthcare Providers:  SeriousBroker.it  This test is no t yet approved or cleared by the [place] FDA and  has been authorized for detection and/or diagnosis of SARS-CoV-2 by FDA under an Emergency Use Authorization (EUA). This EUA will remain  in effect (meaning this test can be used) for the duration of the COVID-19 declaration under Section 564(b)(1) of the Act, 21 U.S.C.section 360bbb-3(b)(1), unless the authorization is terminated  or revoked sooner.       Influenza A by PCR NEGATIVE NEGATIVE Final   Influenza B by PCR NEGATIVE NEGATIVE Final    Comment: (NOTE) The Xpert Xpress SARS-CoV-2/FLU/RSV plus assay is intended as an aid in the diagnosis of influenza from Nasopharyngeal swab specimens and should not be used as a sole basis for treatment. Nasal washings and aspirates are unacceptable for Xpert Xpress SARS-CoV-2/FLU/RSV testing.  Fact Sheet for Patients: BloggerCourse.com  Fact Sheet for Healthcare Providers: SeriousBroker.it  This test is not yet approved or cleared by the [place] FDA and has been authorized for detection and/or diagnosis of SARS-CoV-2 by FDA under an Emergency Use Authorization (EUA). This EUA will remain in effect (meaning this test can be used) for the duration of the COVID-19 declaration under Section 564(b)(1) of the Act, 21 U.S.C. section 360bbb-3(b)(1), unless the authorization is terminated or revoked.     Resp Syncytial Virus by PCR NEGATIVE NEGATIVE Final    Comment: (NOTE) Fact Sheet for Patients: BloggerCourse.com  Fact Sheet for Healthcare Providers: SeriousBroker.it  This test is not yet approved or cleared by the [place] FDA and has been authorized for detection and/or diagnosis of SARS-CoV-2 by FDA under an Emergency Use  Authorization (EUA). This EUA will remain in effect (meaning this test can be used) for the duration of the COVID-19 declaration under Section 564(b)(1) of the Act, 21 U.S.C. section 360bbb-3(b)(1), unless the authorization is terminated or revoked.  Performed at [facility], [address], [place], [place] [address]   Blood Culture (routine x 2)     Status: None   Collection Time: 09/02/23  2:14 AM   Specimen: BLOOD  Result Value Ref Range Status   Specimen Description   Final    BLOOD BLOOD LEFT HAND Performed at [facility], [address], [place], [place] [address]    Special Requests   Final    BOTTLES DRAWN AEROBIC ONLY Blood Culture results may not be optimal due to an inadequate volume of blood received in culture bottles Performed at [facility], [address], [place], [place] [address]  Culture   Final    NO GROWTH 5 DAYS Performed at [facility], [address], [place], [place] [address]    Report Status 09/07/2023 FINAL  Final  Urine Culture     Status: Abnormal   Collection Time: 09/02/23  2:29 AM   Specimen: Urine, Catheterized  Result Value Ref Range Status   Specimen Description   Final    URINE, CATHETERIZED Performed at [facility], [address], [place], [place] [address]    Special Requests   Final    NONE Performed at [facility], [address], [place], [place] [address]    Culture >=100,000 COLONIES/mL KLEBSIELLA PNEUMONIAE (A)  Final   Report Status 09/04/2023 FINAL  Final   Organism ID, Bacteria KLEBSIELLA PNEUMONIAE (A)  Final      Susceptibility   Klebsiella pneumoniae - MIC*    AMPICILLIN RESISTANT Resistant     CEFAZOLIN <=4 SENSITIVE Sensitive     CEFEPIME <=0.12 SENSITIVE Sensitive     CEFTRIAXONE <=0.25 SENSITIVE Sensitive     CIPROFLOXACIN <=0.25 SENSITIVE Sensitive     GENTAMICIN <=1 SENSITIVE Sensitive     IMIPENEM 0.5 SENSITIVE Sensitive     NITROFURANTOIN 32 SENSITIVE Sensitive     TRIMETH/SULFA <=20 SENSITIVE  Sensitive     AMPICILLIN/SULBACTAM <=2 SENSITIVE Sensitive     PIP/TAZO <=4 SENSITIVE Sensitive ug/mL    * >=100,000 COLONIES/mL KLEBSIELLA PNEUMONIAE  Respiratory (~20 pathogens) panel by PCR     Status: None   Collection Time: 09/02/23  6:20 PM   Specimen: Nasopharyngeal Swab; Respiratory  Result Value Ref Range Status   Adenovirus NOT DETECTED NOT DETECTED Final   Coronavirus 229E NOT DETECTED NOT DETECTED Final    Comment: (NOTE) The Coronavirus on the Respiratory Panel, DOES NOT test for the novel  Coronavirus (2019 nCoV)    Coronavirus HKU1 NOT DETECTED NOT DETECTED Final   Coronavirus NL63 NOT DETECTED NOT DETECTED Final   Coronavirus OC43 NOT DETECTED NOT DETECTED Final   Metapneumovirus NOT DETECTED NOT DETECTED Final   Rhinovirus / Enterovirus NOT DETECTED NOT DETECTED Final   Influenza A NOT DETECTED NOT DETECTED Final   Influenza B NOT DETECTED NOT DETECTED Final   Parainfluenza Virus 1 NOT DETECTED NOT DETECTED Final   Parainfluenza Virus 2 NOT DETECTED NOT DETECTED Final   Parainfluenza Virus 3 NOT DETECTED NOT DETECTED Final   Parainfluenza Virus 4 NOT DETECTED NOT DETECTED Final   Respiratory Syncytial Virus NOT DETECTED NOT DETECTED Final   Bordetella pertussis NOT DETECTED NOT DETECTED Final   Bordetella Parapertussis NOT DETECTED NOT DETECTED Final   Chlamydophila pneumoniae NOT DETECTED NOT DETECTED Final   Mycoplasma pneumoniae NOT DETECTED NOT DETECTED Final    Comment: Performed at [facility], [address], [place], [place] [address]     Scheduled Meds:  acetaminophen  1,000 mg Oral Q8H   Chlorhexidine Gluconate Cloth  6 each Topical Q0600   cyanocobalamin  500 mcg Oral Daily   enoxaparin (LOVENOX) injection  40 mg Subcutaneous Q24H   escitalopram  5 mg Oral Daily   feeding supplement  237 mL Oral BID BM   LORazepam  1 mg Oral q morning   pantoprazole  40 mg Oral Daily   rosuvastatin  5 mg Oral Daily   Continuous  Infusions:  Procedures/Studies: CT LUMBAR SPINE WO CONTRAST Result Date: 09/05/2023 CLINICAL DATA:  89 year old female. Sepsis. Back pain. Age indeterminate L3 and L5 compression. EXAM: CT LUMBAR SPINE WITHOUT  CONTRAST TECHNIQUE: Multidetector CT imaging of the lumbar spine was performed without intravenous contrast administration. Multiplanar CT image reconstructions were also generated. RADIATION DOSE REDUCTION: This exam was performed according to the departmental dose-optimization program which includes automated exposure control, adjustment of the mA and/or kV according to patient size and/or use of iterative reconstruction technique. COMPARISON:  Lumbar radiographs yesterday. FINDINGS: Segmentation: Normal. Alignment: Straightening of lumbar lordosis. Vertebrae: Diffuse osteopenia. Chronic appearing lower thoracic rib fractures, including the left 12th rib. Visible lower thoracic vertebrae appear intact. L1 and L2 appear intact. L3 compression fracture with 60% loss of vertebral body height. Minor retropulsion of the posterosuperior endplate. L3 posterior elements appear intact and aligned. Mild L4 inferior endplate compression. L5 moderate compression fracture with 55% loss of height. No retropulsion. But there is also a minimally displaced fracture of the left L5 transverse process. Other L5 posterior elements appear intact and aligned. Comminuted sacral fracture. Bilateral sacral ala are fractured and minimally displaced (series 4, images 113, 124). There is a central S2 superior endplate sacral fracture which is minimally displaced on series 3, image 43. Associated presacral edema. Paraspinal and other soft tissues: Lumbar paraspinal soft tissues remain within normal limits. Small layering pleural effusions with simple fluid density favoring transudate. No evidence of pericardial effusion. Mild lung base atelectasis and pulmonary septal thickening. Aortoiliac calcified atherosclerosis. Normal caliber  abdominal aorta. Disc levels: Capacious underlying spinal canal. No significant lumbar spinal stenosis. IMPRESSION: 1. Advanced osteopenia with Acute vs Subacute minimally displaced fractures of the bilateral sacral ala and the central S2 vertebra. These could be posttraumatic or insufficiency fractures. Associated mild presacral edema. 2. Age indeterminate compression fractures of L3 (moderate), L4 (mild), and L5 (moderate). Minimally displaced left L5 transverse process fracture in conjunction with #1. But no significant retropulsion or other complicating features. 3. Small layering pleural effusions appear to be transudate. Pulmonary interstitial edema not excluded. Aortic Atherosclerosis (ICD10-I70.0). Electronically Signed   By: Odessa Fleming M.D.   On: 09/05/2023 12:34   DG Pelvis 1-2 Views Result Date: 09/05/2023 CLINICAL DATA:  Hip pain. EXAM: PELVIS - 1-2 VIEW COMPARISON:  11/30/2015 and lumbar spine 09/04/2023 FINDINGS: Old right femoral intertrochanteric fracture with internal fixation. Pelvic bony ring appears to be intact. No acute abnormality to the left hip. Evidence for compression deformities involving L3 and L5. IMPRESSION: 1. No acute abnormality to the pelvis. 2. Old right femoral fracture with internal fixation. 3. Compression deformities involving L3 and L5. Electronically Signed   By: Richarda Overlie M.D.   On: 09/05/2023 11:08   US Venous Img Lower Bilateral (DVT) Result Date: 09/04/2023 CLINICAL DATA:  Leg edema and pain EXAM: Bilateral LOWER EXTREMITY VENOUS DOPPLER ULTRASOUND TECHNIQUE: Gray-scale sonography with compression, as well as color and duplex ultrasound, were performed to evaluate the deep venous system(s) from the level of the common femoral vein through the popliteal and proximal calf veins. COMPARISON:  None Available. FINDINGS: VENOUS Normal compressibility of the common femoral, superficial femoral, and popliteal veins, as well as the visualized calf veins. Visualized portions  of profunda femoral vein and great saphenous vein unremarkable. No filling defects to suggest DVT on grayscale or color Doppler imaging. Doppler waveforms show normal direction of venous flow, normal respiratory plasticity and response to augmentation. OTHER None. Limitations: none IMPRESSION: Negative. Electronically Signed   By: Jasmine Pang M.D.   On: 09/04/2023 18:18   DG Thoracic Spine 2 View Result Date: 09/04/2023 CLINICAL DATA:  Fall with back pain EXAM: THORACIC SPINE 2  VIEWS COMPARISON:  None Available. FINDINGS: Mild scoliosis. Coarse interstitial pulmonary infiltrates. Vertebral body heights are grossly maintained. Mild multilevel degenerative osteophytes. IMPRESSION: Mild scoliosis and degenerative change. Electronically Signed   By: Jasmine Pang M.D.   On: 09/04/2023 18:17   DG Lumbar Spine 2-3 Views Result Date: 09/04/2023 CLINICAL DATA:  Fall with low back pain EXAM: LUMBAR SPINE - 2-3 VIEW COMPARISON:  11/30/2015 FINDINGS: Lumbar alignment is within normal limits. Interval age indeterminate mild-to-moderate compression deformity at L3 and mild compression deformity at L5. Disc spaces appear patent. Multilevel degenerative osteophytes. IMPRESSION: Interval age indeterminate compression deformities at L3 and L5 Electronically Signed   By: Jasmine Pang M.D.   On: 09/04/2023 18:16   MR BRAIN WO CONTRAST Result Date: 09/03/2023 CLINICAL DATA:  Provided history: Mental status change, unknown cause. EXAM: MRI HEAD WITHOUT CONTRAST TECHNIQUE: Multiplanar, multiecho pulse sequences of the brain and surrounding structures were obtained without intravenous contrast. COMPARISON:  Non-contrast head CT 09/02/2023.  Brain MRI 10/08/2004. FINDINGS: Intermittently motion degraded examination. Most notably, there is moderate motion degradation of least motion degraded axial FLAIR sequence. Within this limitation, findings are as follows. Brain: Mild-to-moderate for age generalized cerebral atrophy.  Chronic lacunar infarct within the right corona radiata, new from the prior brain MRI of 10/08/2004. Multifocal T2 FLAIR hyperintense signal abnormality elsewhere within the cerebral white matter, nonspecific but compatible with mild-to-moderate chronic small vessel ischemic disease. Partially empty sella turcica. No cortical encephalomalacia is identified. There is no acute infarct. No evidence of an intracranial mass. No extra-axial fluid collection. No midline shift. Vascular: Maintained flow voids within the proximal large arterial vessels. Skull and upper cervical spine: No focal worrisome marrow lesion. Incompletely assessed cervical spondylosis. Sinuses/Orbits: No mass or acute finding within the imaged orbits. Prior bilateral lens replacement. Mild mucosal thickening within the right maxillary and right sphenoid sinuses. Other: Trace fluid within bilateral mastoid air cells. IMPRESSION: 1. Intermittently motion degraded examination. Within this limitation, findings are as follows. 2. No evidence of an acute intracranial abnormality. The diffusion-weighted imaging is of good quality and there is no evidence of an acute infarct. 3. Chronic lacunar infarct within the right corona radiata, new from the prior brain MRI of 10/08/2004. 4. Background mild-to-moderate cerebral white matter chronic small vessel ischemic disease, progressed. 5. Mild-to-moderate for age generalized cerebral atrophy, also progressed. 6. Mild paranasal sinus mucosal thickening. 7. Trace bilateral mastoid effusions. Electronically Signed   By: Jackey Loge D.O.   On: 09/03/2023 18:09   CT Head Wo Contrast Result Date: 09/02/2023 CLINICAL DATA:  Delirium EXAM: CT HEAD WITHOUT CONTRAST TECHNIQUE: Contiguous axial images were obtained from the base of the skull through the vertex without intravenous contrast. RADIATION DOSE REDUCTION: This exam was performed according to the departmental dose-optimization program which includes automated  exposure control, adjustment of the mA and/or kV according to patient size and/or use of iterative reconstruction technique. COMPARISON:  None Available. FINDINGS: Brain: No evidence of acute infarction, hemorrhage, hydrocephalus, extra-axial collection or mass lesion/mass effect. Cerebral atrophy. Partially empty sella. Vascular: No hyperdense vessel or unexpected calcification. Skull: No acute fracture. Sinuses/Orbits: No acute finding. Other: No mastoid effusions. IMPRESSION: No evidence of acute intracranial abnormality. Electronically Signed   By: Feliberto Harts M.D.   On: 09/02/2023 03:49   DG Chest Port 1 View Result Date: 09/02/2023 CLINICAL DATA:  Sepsis EXAM: PORTABLE CHEST 1 VIEW COMPARISON:  08/18/2017 FINDINGS: The lungs are symmetrically well expanded. Reticulonodular infiltrates have developed asymmetrically within the upper lung  zones bilaterally, more severe within right upper lobe, likely reflecting changes of atypical infection, though hypersensitivity pneumonitis or smoking related lung disease could appear similarly in the chronic setting. No pneumothorax or pleural effusion. Cardiac size within limits. Pulmonary vascularity is normal. No acute bone abnormality., IMPRESSION: 1. Reticulonodular infiltrates within the upper lung zones bilaterally, more severe within right upper lobe, likely reflecting changes of atypical infection in the acute setting. Electronically Signed   By: Helyn Numbers M.D.   On: 09/02/2023 03:19    Catarina Hartshorn, DO  [facility]  If 7PM-7AM, please contact night-coverage [email or web address] Password [email or web address] 09/07/2023, 6:08 PM   LOS: 5 days

## 2023-09-07 NOTE — Progress Notes (Addendum)
 Nurse at bedside,patient alert to person,confused to place,and time,also situation,patient is pleasant.No c/o pain or discomfort noted at this am. Patient was able  to take her medications whole one at a time with no issues.. Foley 20 french was discontinued per Dr Don Perking orders,patient tolerated procedure,10 ml's of saline returned to syringe after balloon was deflated. 300 ml's of yellow color urine to drainage bag. Plan of care on going.

## 2023-09-07 NOTE — TOC Progression Note (Signed)
 Transition of Care [facility]) - Progression Note    Patient Details  Name: Mikayla Wilson MRN: [id number] Date of Birth: Dec 03, 1930  Transition of Care [facility]) CM/SW Contact  Leitha Bleak, RN Phone Number: 09/07/2023, 10:14 AM  Clinical Narrative:   No bed offers, TOC expanding the bed search, Auth expires tomorrow.    Expected Discharge Plan: Skilled Nursing Facility Barriers to Discharge: Continued Medical Work up  Expected Discharge Plan and Services     Post Acute Care Choice: Skilled Nursing Facility Living arrangements for the past 2 months: Single Family Home                     Social Determinants of Health (SDOH) Interventions SDOH Screenings   Food Insecurity: Patient Unable To Answer (09/03/2023)  Housing: Patient Unable To Answer (09/03/2023)  Transportation Needs: Patient Unable To Answer (09/03/2023)  Utilities: Patient Unable To Answer (09/03/2023)  Depression (PHQ2-9): Low Risk  (03/09/2023)  Social Connections: Unknown (09/03/2023)  Tobacco Use: High Risk (09/02/2023)   Readmission Risk Interventions     No data to display

## 2023-09-07 NOTE — Plan of Care (Signed)
   Problem: Education: Goal: Knowledge of General Education information will improve Description Including pain rating scale, medication(s)/side effects and non-pharmacologic comfort measures Outcome: Progressing   Problem: Education: Goal: Knowledge of General Education information will improve Description Including pain rating scale, medication(s)/side effects and non-pharmacologic comfort measures Outcome: Progressing

## 2023-09-08 DIAGNOSIS — N3000 Acute cystitis without hematuria: Secondary | ICD-10-CM | POA: Diagnosis not present

## 2023-09-08 DIAGNOSIS — A4150 Gram-negative sepsis, unspecified: Secondary | ICD-10-CM | POA: Diagnosis not present

## 2023-09-08 DIAGNOSIS — G9341 Metabolic encephalopathy: Secondary | ICD-10-CM | POA: Diagnosis not present

## 2023-09-08 DIAGNOSIS — A419 Sepsis, unspecified organism: Secondary | ICD-10-CM | POA: Diagnosis not present

## 2023-09-08 MED ORDER — ACETAMINOPHEN 500 MG PO TABS: 1000.0000 mg | ORAL_TABLET | Freq: Three times a day (TID) | ORAL | Status: AC

## 2023-09-08 MED ORDER — ENSURE ENLIVE PO LIQD: 237.0000 mL | Freq: Two times a day (BID) | ORAL | Status: DC

## 2023-09-08 MED ORDER — CYANOCOBALAMIN 500 MCG PO TABS: 500.0000 ug | ORAL_TABLET | Freq: Every day | ORAL | Status: AC

## 2023-09-08 MED ORDER — LORAZEPAM 1 MG PO TABS: 1.0000 mg | ORAL_TABLET | Freq: Every morning | ORAL | 0 refills | Status: DC

## 2023-09-08 MED ORDER — CEFADROXIL 500 MG PO CAPS: 500.0000 mg | ORAL_CAPSULE | Freq: Two times a day (BID) | ORAL | Status: DC

## 2023-09-08 NOTE — Care Management Important Message (Signed)
 Important Message  Patient Details  Name: Mikayla Wilson MRN: [id number] Date of Birth: [birth year]   Important Message Given:  Yes - Medicare IM (given to family in room)     Corey Harold 09/08/2023, 1:29 PM

## 2023-09-08 NOTE — Care Management Important Message (Signed)
 Important Message  Patient Details  Name: Mikayla Wilson MRN: [id number] Date of Birth: 03-06-1931   Important Message Given:  Yes - Medicare IM     Corey Harold 09/08/2023, 1:28 PM

## 2023-09-08 NOTE — Progress Notes (Signed)
 Nurse at bedside,patient alert to person,was able to tell me that she was in [place],confused to time and situation. Patient was able to take her medications again whole and one at a time this am.Very pleasant . Plan of care on going.

## 2023-09-08 NOTE — Progress Notes (Signed)
 Patient discharged to [facility] called and given to [facility] LPN.[facility] [place] to transport patient to awaiting facility.Family at bedside.Plan of care on going.

## 2023-09-08 NOTE — TOC Transition Note (Addendum)
 Transition of Care [facility]) - Discharge Note   Patient Details  Name: Mikayla Wilson MRN: [id number] Date of Birth: 08/05/1930  Transition of Care [facility]) CM/SW Contact:  Leitha Bleak, RN Phone Number: 09/08/2023, 1:52 PM  Clinical Narrative:   [facility] received. [facility] accepted. Family's first choice. TOC waiting for Shana to provide a room number, RN will call report. TOC following to call EMS.  Family updated, waiting with patient at the bed side.   Shana provided room number 208-4, RN given number to call report.    Final next level of care: Skilled Nursing Facility Barriers to Discharge: Barriers Resolved   Patient Goals and CMS Choice Patient states their goals for this hospitalization and ongoing recovery are:: rehab   Choice offered to / list presented to : Adult Children [place] ownership interest in [facility].provided to::  (n/a- Want [facility])    Discharge Placement               Patient to be transferred to facility by: EMS Name of family member notified: Family at bed side Patient and family notified of of transfer: 09/08/23  Discharge Plan and Services Additional resources added to the After Visit Summary for       Post Acute Care Choice: Skilled Nursing Facility               Social Drivers of Health (SDOH) Interventions SDOH Screenings   Food Insecurity: Patient Unable To Answer (09/03/2023)  Housing: Patient Unable To Answer (09/03/2023)  Transportation Needs: Patient Unable To Answer (09/03/2023)  Utilities: Patient Unable To Answer (09/03/2023)  Depression (PHQ2-9): Low Risk  (03/09/2023)  Social Connections: Unknown (09/03/2023)  Tobacco Use: High Risk (09/02/2023)    Readmission Risk Interventions     No data to display

## 2023-09-08 NOTE — Plan of Care (Signed)
  Problem: Fluid Volume: Goal: Hemodynamic stability will improve Outcome: Progressing   Problem: Fluid Volume: Goal: Hemodynamic stability will improve Outcome: Progressing

## 2023-09-08 NOTE — Discharge Summary (Signed)
 Physician Discharge Summary   Patient: Mikayla Wilson MRN: [id number] DOB: 1930-08-09  Admit date:     09/02/2023  Discharge date: 09/08/23  Discharge Physician: Onalee Hua Kalib Bhagat   PCP: Dettinger, Elige Radon, MD   Recommendations at discharge:   Please follow up with primary care provider within 1-2 weeks  Please repeat BMP and CBC in one week    Hospital Course: 89 year old female with a history of cognitive impairment, coronary artery disease, anxiety, GERD, hyperlipidemia, asthma presenting with generalized weakness and confusion for 2 days.  History is obtained from the patient's daughter.  Patient is unable provide any history secondary to her acute encephalopathy.  At baseline, the patient ambulates with a walker.  Daughter states that the patient has " a touch of dementia".  Daughter helps assist the patient with bathing and clothing.  Daughter has noted that the patient has had decreased oral intake with confusion and agitation for last 2 days.  There is no reports of vomiting, diarrhea, abdominal pain, hemoptysis, fever, chills. In the ED, the patient was afebrile hemodynamically stable with oxygen saturation 94% room air.  WBC 12.1, hemoglobin 13.0, platelets 157.  Sodium 130, potassium 3.9, bicarbonate 21, serum creatinine 0.9.  LFTs were unremarkable.  Ammonia 19.  COVID-19 PCR is negative.  Lactic acid 1.8.  UA showed>50 WBC.  Chest x-ray showed upper lobe opacities.  CT brain was negative for acute findings.  Patient was started on ceftriaxone and azithromycin.  She was admitted for further evaluation treatment for confusion. Her mental status gradually improved and returned to near baseline.  She remained pleasantly confused.  Her baseline home ativan was restarted which pt tolerated well.  PT recommended SNF with which the family agreed.    Assessment and Plan: Sepsis -Present on admission -Presented with tachycardia and leukocytosis -Due to UTI and pneumonia -Lactic acid  1.8 -UA>50 WBC -Chest x-ray with upper lobe opacities -sepsis physiology resolved   Lobar pneumonia -Continue ceftriaxone and azithromycin>>received 5 days -COVID-19/RSV/flu--neg -viral respiratory panel neg   Acute respiratory failure with hypoxia -had oxygen saturation 89% on RA -stable on 1.5L>>RA -wean to RA for saturation >92% -remains stable on RA   Klebsiella UTI -Concerning for UTI -UA>50 WBC -continue ceftriaxone>>cefadroxil x 2 more doses after d/c   Acute metabolic encephalopathy -Secondary to infectious process and low B12 -Ammonia 19 -remains agitated and confused on 2/20 -CT brain negative -B12--101>>IM x 1 given, then start po B12 -folate--13.0 -TSH--1.522 -MR brain--neg for acute findings -ativan scheduled every am--discussed with daughter and she is adamant about giving daily ativan -2/22-mental status improving-->pleasantly confused   Anxiety -Continue Ativan as discussed above -PDMP reviewed -1 mg, #40, last refill 08/13/2023   GERD -Continue PPI   Lumbar compression fracture/Sacral Ala fracture -pt complains low and mid back pain -daughter concerned pt had unwitnessed fall -obtain thoracic spine xray--DJD and  -lumbar spine xray--age indeterminant L3 and L5  -xray pelvis--neg for acute findings -CT lumbar spine--Age indeterminate compression fractures of L3 (moderate), L4 (mild), and L5 (moderate); Subacute minimally displaced fractures of the bilateral sacral ala and the central S2 vertebra. -added acetaminophen 1000 mg q 8   Leg pain -venous duplex--neg        Consultants: none Procedures performed: none  Disposition: Skilled nursing facility Diet recommendation:  Regular diet DISCHARGE MEDICATION: Allergies as of 09/08/2023       Reactions   Aspirin Other (See Comments)   "My doctor told me to never take aspirin"   Morphine And  Codeine Other (See Comments)        Medication List     TAKE these medications     acetaminophen 500 MG tablet Commonly known as: TYLENOL Take 2 tablets (1,000 mg total) by mouth every 8 (eight) hours. What changed:  medication strength how much to take when to take this reasons to take this   cefadroxil 500 MG capsule Commonly known as: DURICEF Take 1 capsule (500 mg total) by mouth 2 (two) times daily. X 2 doses   cyanocobalamin 500 MCG tablet Commonly known as: VITAMIN B12 Take 1 tablet (500 mcg total) by mouth daily. Start taking on: September 09, 2023   escitalopram 5 MG tablet Commonly known as: LEXAPRO Take 1 tablet (5 mg total) by mouth daily.   famotidine 20 MG tablet Commonly known as: PEPCID Take 1 tablet (20 mg total) by mouth 2 (two) times daily.   feeding supplement Liqd Take 237 mLs by mouth 2 (two) times daily between meals.   ketotifen 0.025 % ophthalmic solution Commonly known as: ZADITOR Place 1 drop into both eyes 6 (six) times daily.   loratadine 10 MG tablet Commonly known as: EQ Loratadine Take 1 tablet (10 mg total) by mouth daily. TAKE 1 TABLET BY MOUTH ONCE DAILY AS NEEDED FOR ALLERGIES   LORazepam 1 MG tablet Commonly known as: ATIVAN Take 1 tablet (1 mg total) by mouth every morning. Start taking on: September 09, 2023 What changed:  when to take this reasons to take this   omeprazole 20 MG capsule Commonly known as: PRILOSEC Take 1 capsule (20 mg total) by mouth daily.   potassium chloride 10 MEQ tablet Commonly known as: KLOR-CON M Take 1 tablet (10 mEq total) by mouth daily.   rosuvastatin 5 MG tablet Commonly known as: CRESTOR Take 1 tablet (5 mg total) by mouth daily.        Discharge Exam: Filed Weights   09/02/23 0115  Weight: 56.7 kg   HEENT:  /AT, No thrush, no icterus CV:  RRR, no rub, no S3, no S4 Lung:  bibasilar rales.  No wheeze Abd:  soft/+BS, NT Ext:  No edema, no lymphangitis, no synovitis, no rash   Condition at discharge: stable  The results of significant diagnostics from  this hospitalization (including imaging, microbiology, ancillary and laboratory) are listed below for reference.   Imaging Studies: CT LUMBAR SPINE WO CONTRAST Result Date: 09/05/2023 CLINICAL DATA:  89 year old female. Sepsis. Back pain. Age indeterminate L3 and L5 compression. EXAM: CT LUMBAR SPINE WITHOUT CONTRAST TECHNIQUE: Multidetector CT imaging of the lumbar spine was performed without intravenous contrast administration. Multiplanar CT image reconstructions were also generated. RADIATION DOSE REDUCTION: This exam was performed according to the departmental dose-optimization program which includes automated exposure control, adjustment of the mA and/or kV according to patient size and/or use of iterative reconstruction technique. COMPARISON:  Lumbar radiographs yesterday. FINDINGS: Segmentation: Normal. Alignment: Straightening of lumbar lordosis. Vertebrae: Diffuse osteopenia. Chronic appearing lower thoracic rib fractures, including the left 12th rib. Visible lower thoracic vertebrae appear intact. L1 and L2 appear intact. L3 compression fracture with 60% loss of vertebral body height. Minor retropulsion of the posterosuperior endplate. L3 posterior elements appear intact and aligned. Mild L4 inferior endplate compression. L5 moderate compression fracture with 55% loss of height. No retropulsion. But there is also a minimally displaced fracture of the left L5 transverse process. Other L5 posterior elements appear intact and aligned. Comminuted sacral fracture. Bilateral sacral ala are fractured and minimally displaced (series  4, images 113, 124). There is a central S2 superior endplate sacral fracture which is minimally displaced on series 3, image 43. Associated presacral edema. Paraspinal and other soft tissues: Lumbar paraspinal soft tissues remain within normal limits. Small layering pleural effusions with simple fluid density favoring transudate. No evidence of pericardial effusion. Mild lung base  atelectasis and pulmonary septal thickening. Aortoiliac calcified atherosclerosis. Normal caliber abdominal aorta. Disc levels: Capacious underlying spinal canal. No significant lumbar spinal stenosis. IMPRESSION: 1. Advanced osteopenia with Acute vs Subacute minimally displaced fractures of the bilateral sacral ala and the central S2 vertebra. These could be posttraumatic or insufficiency fractures. Associated mild presacral edema. 2. Age indeterminate compression fractures of L3 (moderate), L4 (mild), and L5 (moderate). Minimally displaced left L5 transverse process fracture in conjunction with #1. But no significant retropulsion or other complicating features. 3. Small layering pleural effusions appear to be transudate. Pulmonary interstitial edema not excluded. Aortic Atherosclerosis (ICD10-I70.0). Electronically Signed   By: Odessa Fleming M.D.   On: 09/05/2023 12:34   DG Pelvis 1-2 Views Result Date: 09/05/2023 CLINICAL DATA:  Hip pain. EXAM: PELVIS - 1-2 VIEW COMPARISON:  11/30/2015 and lumbar spine 09/04/2023 FINDINGS: Old right femoral intertrochanteric fracture with internal fixation. Pelvic bony ring appears to be intact. No acute abnormality to the left hip. Evidence for compression deformities involving L3 and L5. IMPRESSION: 1. No acute abnormality to the pelvis. 2. Old right femoral fracture with internal fixation. 3. Compression deformities involving L3 and L5. Electronically Signed   By: Richarda Overlie M.D.   On: 09/05/2023 11:08   US Venous Img Lower Bilateral (DVT) Result Date: 09/04/2023 CLINICAL DATA:  Leg edema and pain EXAM: Bilateral LOWER EXTREMITY VENOUS DOPPLER ULTRASOUND TECHNIQUE: Gray-scale sonography with compression, as well as color and duplex ultrasound, were performed to evaluate the deep venous system(s) from the level of the common femoral vein through the popliteal and proximal calf veins. COMPARISON:  None Available. FINDINGS: VENOUS Normal compressibility of the common femoral,  superficial femoral, and popliteal veins, as well as the visualized calf veins. Visualized portions of profunda femoral vein and great saphenous vein unremarkable. No filling defects to suggest DVT on grayscale or color Doppler imaging. Doppler waveforms show normal direction of venous flow, normal respiratory plasticity and response to augmentation. OTHER None. Limitations: none IMPRESSION: Negative. Electronically Signed   By: Jasmine Pang M.D.   On: 09/04/2023 18:18   DG Thoracic Spine 2 View Result Date: 09/04/2023 CLINICAL DATA:  Fall with back pain EXAM: THORACIC SPINE 2 VIEWS COMPARISON:  None Available. FINDINGS: Mild scoliosis. Coarse interstitial pulmonary infiltrates. Vertebral body heights are grossly maintained. Mild multilevel degenerative osteophytes. IMPRESSION: Mild scoliosis and degenerative change. Electronically Signed   By: Jasmine Pang M.D.   On: 09/04/2023 18:17   DG Lumbar Spine 2-3 Views Result Date: 09/04/2023 CLINICAL DATA:  Fall with low back pain EXAM: LUMBAR SPINE - 2-3 VIEW COMPARISON:  11/30/2015 FINDINGS: Lumbar alignment is within normal limits. Interval age indeterminate mild-to-moderate compression deformity at L3 and mild compression deformity at L5. Disc spaces appear patent. Multilevel degenerative osteophytes. IMPRESSION: Interval age indeterminate compression deformities at L3 and L5 Electronically Signed   By: Jasmine Pang M.D.   On: 09/04/2023 18:16   MR BRAIN WO CONTRAST Result Date: 09/03/2023 CLINICAL DATA:  Provided history: Mental status change, unknown cause. EXAM: MRI HEAD WITHOUT CONTRAST TECHNIQUE: Multiplanar, multiecho pulse sequences of the brain and surrounding structures were obtained without intravenous contrast. COMPARISON:  Non-contrast head CT 09/02/2023.  Brain MRI 10/08/2004. FINDINGS: Intermittently motion degraded examination. Most notably, there is moderate motion degradation of least motion degraded axial FLAIR sequence. Within this  limitation, findings are as follows. Brain: Mild-to-moderate for age generalized cerebral atrophy. Chronic lacunar infarct within the right corona radiata, new from the prior brain MRI of 10/08/2004. Multifocal T2 FLAIR hyperintense signal abnormality elsewhere within the cerebral white matter, nonspecific but compatible with mild-to-moderate chronic small vessel ischemic disease. Partially empty sella turcica. No cortical encephalomalacia is identified. There is no acute infarct. No evidence of an intracranial mass. No extra-axial fluid collection. No midline shift. Vascular: Maintained flow voids within the proximal large arterial vessels. Skull and upper cervical spine: No focal worrisome marrow lesion. Incompletely assessed cervical spondylosis. Sinuses/Orbits: No mass or acute finding within the imaged orbits. Prior bilateral lens replacement. Mild mucosal thickening within the right maxillary and right sphenoid sinuses. Other: Trace fluid within bilateral mastoid air cells. IMPRESSION: 1. Intermittently motion degraded examination. Within this limitation, findings are as follows. 2. No evidence of an acute intracranial abnormality. The diffusion-weighted imaging is of good quality and there is no evidence of an acute infarct. 3. Chronic lacunar infarct within the right corona radiata, new from the prior brain MRI of 10/08/2004. 4. Background mild-to-moderate cerebral white matter chronic small vessel ischemic disease, progressed. 5. Mild-to-moderate for age generalized cerebral atrophy, also progressed. 6. Mild paranasal sinus mucosal thickening. 7. Trace bilateral mastoid effusions. Electronically Signed   By: Jackey Loge D.O.   On: 09/03/2023 18:09   CT Head Wo Contrast Result Date: 09/02/2023 CLINICAL DATA:  Delirium EXAM: CT HEAD WITHOUT CONTRAST TECHNIQUE: Contiguous axial images were obtained from the base of the skull through the vertex without intravenous contrast. RADIATION DOSE REDUCTION: This  exam was performed according to the departmental dose-optimization program which includes automated exposure control, adjustment of the mA and/or kV according to patient size and/or use of iterative reconstruction technique. COMPARISON:  None Available. FINDINGS: Brain: No evidence of acute infarction, hemorrhage, hydrocephalus, extra-axial collection or mass lesion/mass effect. Cerebral atrophy. Partially empty sella. Vascular: No hyperdense vessel or unexpected calcification. Skull: No acute fracture. Sinuses/Orbits: No acute finding. Other: No mastoid effusions. IMPRESSION: No evidence of acute intracranial abnormality. Electronically Signed   By: Feliberto Harts M.D.   On: 09/02/2023 03:49   DG Chest Port 1 View Result Date: 09/02/2023 CLINICAL DATA:  Sepsis EXAM: PORTABLE CHEST 1 VIEW COMPARISON:  08/18/2017 FINDINGS: The lungs are symmetrically well expanded. Reticulonodular infiltrates have developed asymmetrically within the upper lung zones bilaterally, more severe within right upper lobe, likely reflecting changes of atypical infection, though hypersensitivity pneumonitis or smoking related lung disease could appear similarly in the chronic setting. No pneumothorax or pleural effusion. Cardiac size within limits. Pulmonary vascularity is normal. No acute bone abnormality., IMPRESSION: 1. Reticulonodular infiltrates within the upper lung zones bilaterally, more severe within right upper lobe, likely reflecting changes of atypical infection in the acute setting. Electronically Signed   By: Helyn Numbers M.D.   On: 09/02/2023 03:19    Microbiology: Results for orders placed or performed during the hospital encounter of 09/02/23  Blood Culture (routine x 2)     Status: None   Collection Time: 09/02/23  2:09 AM   Specimen: BLOOD  Result Value Ref Range Status   Specimen Description   Final    BLOOD BLOOD LEFT FOREARM Performed at [facility], [address], [place], [place] [address]     Special Requests   Final  BOTTLES DRAWN AEROBIC AND ANAEROBIC Blood Culture adequate volume Performed at [facility], [address], [place], [place] [address]    Culture   Final    NO GROWTH 5 DAYS Performed at [facility], [address], [place], [place] [address]    Report Status 09/07/2023 FINAL  Final  Resp panel by RT-PCR (RSV, Flu A&B, Covid) Anterior Nasal Swab     Status: None   Collection Time: 09/02/23  2:09 AM   Specimen: Anterior Nasal Swab  Result Value Ref Range Status   SARS Coronavirus 2 by RT PCR NEGATIVE NEGATIVE Final    Comment: (NOTE) SARS-CoV-2 target nucleic acids are NOT DETECTED.  The SARS-CoV-2 RNA is generally detectable in upper respiratory specimens during the acute phase of infection. The lowest concentration of SARS-CoV-2 viral copies this assay can detect is 138 copies/mL. A negative result does not preclude SARS-Cov-2 infection and should not be used as the sole basis for treatment or other patient management decisions. A negative result may occur with  improper specimen collection/handling, submission of specimen other than nasopharyngeal swab, presence of viral mutation(s) within the areas targeted by this assay, and inadequate number of viral copies(<138 copies/mL). A negative result must be combined with clinical observations, patient history, and epidemiological information. The expected result is Negative.  Fact Sheet for Patients:  BloggerCourse.com  Fact Sheet for Healthcare Providers:  SeriousBroker.it  This test is no t yet approved or cleared by the [place] FDA and  has been authorized for detection and/or diagnosis of SARS-CoV-2 by FDA under an Emergency Use Authorization (EUA). This EUA will remain  in effect (meaning this test can be used) for the duration of the COVID-19 declaration under Section 564(b)(1) of the Act, 21 U.S.C.section 360bbb-3(b)(1), unless the  authorization is terminated  or revoked sooner.       Influenza A by PCR NEGATIVE NEGATIVE Final   Influenza B by PCR NEGATIVE NEGATIVE Final    Comment: (NOTE) The Xpert Xpress SARS-CoV-2/FLU/RSV plus assay is intended as an aid in the diagnosis of influenza from Nasopharyngeal swab specimens and should not be used as a sole basis for treatment. Nasal washings and aspirates are unacceptable for Xpert Xpress SARS-CoV-2/FLU/RSV testing.  Fact Sheet for Patients: BloggerCourse.com  Fact Sheet for Healthcare Providers: SeriousBroker.it  This test is not yet approved or cleared by the [place] FDA and has been authorized for detection and/or diagnosis of SARS-CoV-2 by FDA under an Emergency Use Authorization (EUA). This EUA will remain in effect (meaning this test can be used) for the duration of the COVID-19 declaration under Section 564(b)(1) of the Act, 21 U.S.C. section 360bbb-3(b)(1), unless the authorization is terminated or revoked.     Resp Syncytial Virus by PCR NEGATIVE NEGATIVE Final    Comment: (NOTE) Fact Sheet for Patients: BloggerCourse.com  Fact Sheet for Healthcare Providers: SeriousBroker.it  This test is not yet approved or cleared by the [place] FDA and has been authorized for detection and/or diagnosis of SARS-CoV-2 by FDA under an Emergency Use Authorization (EUA). This EUA will remain in effect (meaning this test can be used) for the duration of the COVID-19 declaration under Section 564(b)(1) of the Act, 21 U.S.C. section 360bbb-3(b)(1), unless the authorization is terminated or revoked.  Performed at [facility], [address] [place], [place] [address]   Blood Culture (routine x 2)     Status: None   Collection Time: 09/02/23  2:14 AM   Specimen: BLOOD  Result  Value Ref Range Status   Specimen Description   Final    BLOOD BLOOD LEFT  HAND Performed at [facility], [address], [place], [place] [address]    Special Requests   Final    BOTTLES DRAWN AEROBIC ONLY Blood Culture results may not be optimal due to an inadequate volume of blood received in culture bottles Performed at [facility], [address], [place], [place] [address]    Culture   Final    NO GROWTH 5 DAYS Performed at [facility], [address], [place], [place] [address]    Report Status 09/07/2023 FINAL  Final  Urine Culture     Status: Abnormal   Collection Time: 09/02/23  2:29 AM   Specimen: Urine, Catheterized  Result Value Ref Range Status   Specimen Description   Final    URINE, CATHETERIZED Performed at [facility], [address], [place], [place] [address]    Special Requests   Final    NONE Performed at [facility], [address], [place], [place] [address]    Culture >=100,000 COLONIES/mL KLEBSIELLA PNEUMONIAE (A)  Final   Report Status 09/04/2023 FINAL  Final   Organism ID, Bacteria KLEBSIELLA PNEUMONIAE (A)  Final      Susceptibility   Klebsiella pneumoniae - MIC*    AMPICILLIN RESISTANT Resistant     CEFAZOLIN <=4 SENSITIVE Sensitive     CEFEPIME <=0.12 SENSITIVE Sensitive     CEFTRIAXONE <=0.25 SENSITIVE Sensitive     CIPROFLOXACIN <=0.25 SENSITIVE Sensitive     GENTAMICIN <=1 SENSITIVE Sensitive     IMIPENEM 0.5 SENSITIVE Sensitive     NITROFURANTOIN 32 SENSITIVE Sensitive     TRIMETH/SULFA <=20 SENSITIVE Sensitive     AMPICILLIN/SULBACTAM <=2 SENSITIVE Sensitive     PIP/TAZO <=4 SENSITIVE Sensitive ug/mL    * >=100,000 COLONIES/mL KLEBSIELLA PNEUMONIAE  Respiratory (~20 pathogens) panel by PCR     Status: None   Collection Time: 09/02/23  6:20 PM   Specimen: Nasopharyngeal Swab; Respiratory  Result Value Ref Range Status   Adenovirus NOT DETECTED NOT DETECTED Final   Coronavirus 229E NOT DETECTED NOT DETECTED Final    Comment: (NOTE) The Coronavirus on the Respiratory Panel, DOES NOT test for the novel   Coronavirus (2019 nCoV)    Coronavirus HKU1 NOT DETECTED NOT DETECTED Final   Coronavirus NL63 NOT DETECTED NOT DETECTED Final   Coronavirus OC43 NOT DETECTED NOT DETECTED Final   Metapneumovirus NOT DETECTED NOT DETECTED Final   Rhinovirus / Enterovirus NOT DETECTED NOT DETECTED Final   Influenza A NOT DETECTED NOT DETECTED Final   Influenza B NOT DETECTED NOT DETECTED Final   Parainfluenza Virus 1 NOT DETECTED NOT DETECTED Final   Parainfluenza Virus 2 NOT DETECTED NOT DETECTED Final   Parainfluenza Virus 3 NOT DETECTED NOT DETECTED Final   Parainfluenza Virus 4 NOT DETECTED NOT DETECTED Final   Respiratory Syncytial Virus NOT DETECTED NOT DETECTED Final   Bordetella pertussis NOT DETECTED NOT DETECTED Final   Bordetella Parapertussis NOT DETECTED NOT DETECTED Final   Chlamydophila pneumoniae NOT DETECTED NOT DETECTED Final   Mycoplasma pneumoniae NOT DETECTED NOT DETECTED Final    Comment: Performed at [facility], [address] [place], [place] [address]    Labs: CBC: Recent Labs  Lab 09/02/23 0209 09/03/23 0501  WBC 12.1* 8.5  NEUTROABS 9.4*  --   HGB 13.0 11.2*  HCT 39.8 32.8*  MCV 94.3 94.5  PLT 157 148*  Basic Metabolic Panel: Recent Labs  Lab 09/02/23 0209 09/03/23 0501 09/04/23 0519 09/05/23 0327 09/06/23 1011  NA 138 140 139 136 139  K 3.9 3.1* 3.5 3.0* 3.7  CL 103 105 106 105 103  CO2 21* 22 22 23 25   GLUCOSE 140* 91 58* 104* 135*  BUN 18 8 5* 8 7*  CREATININE 0.89 0.70 0.66 0.72 0.63  CALCIUM 9.1 8.4* 8.4* 8.1* 8.4*  MG  --   --  2.0  --   --    Liver Function Tests: Recent Labs  Lab 09/02/23 0209  AST 22  ALT 16  ALKPHOS 84  BILITOT 1.3*  PROT 7.4  ALBUMIN 3.7   CBG: No results for input(s): "GLUCAP" in the last 168 hours.  Discharge time spent: greater than 30 minutes.  Signed: Catarina Hartshorn, MD [facility] 09/08/2023

## 2023-09-09 DIAGNOSIS — N390 Urinary tract infection, site not specified: Secondary | ICD-10-CM | POA: Diagnosis not present

## 2023-09-09 DIAGNOSIS — K219 Gastro-esophageal reflux disease without esophagitis: Secondary | ICD-10-CM | POA: Diagnosis not present

## 2023-09-09 DIAGNOSIS — F419 Anxiety disorder, unspecified: Secondary | ICD-10-CM | POA: Diagnosis not present

## 2023-09-09 DIAGNOSIS — D649 Anemia, unspecified: Secondary | ICD-10-CM | POA: Diagnosis not present

## 2023-09-09 DIAGNOSIS — F339 Major depressive disorder, recurrent, unspecified: Secondary | ICD-10-CM | POA: Diagnosis not present

## 2023-09-09 DIAGNOSIS — J189 Pneumonia, unspecified organism: Secondary | ICD-10-CM | POA: Diagnosis not present

## 2023-09-09 DIAGNOSIS — R29898 Other symptoms and signs involving the musculoskeletal system: Secondary | ICD-10-CM | POA: Diagnosis not present

## 2023-09-09 DIAGNOSIS — S32120D Nondisplaced Zone II fracture of sacrum, subsequent encounter for fracture with routine healing: Secondary | ICD-10-CM | POA: Diagnosis not present

## 2023-09-09 DIAGNOSIS — A419 Sepsis, unspecified organism: Secondary | ICD-10-CM | POA: Diagnosis not present

## 2023-09-09 DIAGNOSIS — Z7401 Bed confinement status: Secondary | ICD-10-CM | POA: Diagnosis not present

## 2023-09-09 DIAGNOSIS — J9601 Acute respiratory failure with hypoxia: Secondary | ICD-10-CM | POA: Diagnosis not present

## 2023-09-09 DIAGNOSIS — N3000 Acute cystitis without hematuria: Secondary | ICD-10-CM | POA: Diagnosis not present

## 2023-09-09 DIAGNOSIS — G9341 Metabolic encephalopathy: Secondary | ICD-10-CM | POA: Diagnosis not present

## 2023-09-09 DIAGNOSIS — R5381 Other malaise: Secondary | ICD-10-CM | POA: Diagnosis not present

## 2023-09-09 DIAGNOSIS — J45909 Unspecified asthma, uncomplicated: Secondary | ICD-10-CM | POA: Diagnosis not present

## 2023-09-09 DIAGNOSIS — E785 Hyperlipidemia, unspecified: Secondary | ICD-10-CM | POA: Diagnosis not present

## 2023-09-09 LAB — CREATININE, SERUM
Creatinine, Ser: 0.74 mg/dL (ref 0.44–1.00)
GFR, Estimated: >60 mL/min (ref >60)

## 2023-09-09 NOTE — Plan of Care (Signed)
   Problem: Activity: Goal: Risk for activity intolerance will decrease Outcome: Progressing   Problem: Coping: Goal: Level of anxiety will decrease Outcome: Progressing

## 2023-09-10 ENCOUNTER — Ambulatory Visit: Payer: Medicare HMO | Admitting: Family Medicine

## 2023-09-14 DIAGNOSIS — S32120D Nondisplaced Zone II fracture of sacrum, subsequent encounter for fracture with routine healing: Secondary | ICD-10-CM | POA: Diagnosis not present

## 2023-09-14 DIAGNOSIS — R5381 Other malaise: Secondary | ICD-10-CM | POA: Diagnosis not present

## 2023-09-14 DIAGNOSIS — G9341 Metabolic encephalopathy: Secondary | ICD-10-CM | POA: Diagnosis not present

## 2023-09-16 DIAGNOSIS — N3000 Acute cystitis without hematuria: Secondary | ICD-10-CM | POA: Diagnosis not present

## 2023-09-16 DIAGNOSIS — R5381 Other malaise: Secondary | ICD-10-CM | POA: Diagnosis not present

## 2023-09-16 DIAGNOSIS — F339 Major depressive disorder, recurrent, unspecified: Secondary | ICD-10-CM | POA: Diagnosis not present

## 2023-09-16 DIAGNOSIS — N390 Urinary tract infection, site not specified: Secondary | ICD-10-CM | POA: Diagnosis not present

## 2023-09-16 DIAGNOSIS — F419 Anxiety disorder, unspecified: Secondary | ICD-10-CM | POA: Diagnosis not present

## 2023-09-16 DIAGNOSIS — G9341 Metabolic encephalopathy: Secondary | ICD-10-CM | POA: Diagnosis not present

## 2023-09-17 DIAGNOSIS — G9341 Metabolic encephalopathy: Secondary | ICD-10-CM | POA: Diagnosis not present

## 2023-09-17 DIAGNOSIS — D649 Anemia, unspecified: Secondary | ICD-10-CM | POA: Diagnosis not present

## 2023-09-17 DIAGNOSIS — N3000 Acute cystitis without hematuria: Secondary | ICD-10-CM | POA: Diagnosis not present

## 2023-09-17 DIAGNOSIS — J9601 Acute respiratory failure with hypoxia: Secondary | ICD-10-CM | POA: Diagnosis not present

## 2023-09-17 DIAGNOSIS — E785 Hyperlipidemia, unspecified: Secondary | ICD-10-CM | POA: Diagnosis not present

## 2023-10-14 DIAGNOSIS — F339 Major depressive disorder, recurrent, unspecified: Secondary | ICD-10-CM | POA: Diagnosis not present

## 2023-10-14 DIAGNOSIS — F419 Anxiety disorder, unspecified: Secondary | ICD-10-CM | POA: Diagnosis not present

## 2023-10-21 DIAGNOSIS — F0390 Unspecified dementia without behavioral disturbance: Secondary | ICD-10-CM | POA: Diagnosis not present

## 2023-10-21 DIAGNOSIS — F339 Major depressive disorder, recurrent, unspecified: Secondary | ICD-10-CM | POA: Diagnosis not present

## 2023-10-21 DIAGNOSIS — F419 Anxiety disorder, unspecified: Secondary | ICD-10-CM | POA: Diagnosis not present

## 2023-10-21 DIAGNOSIS — I10 Essential (primary) hypertension: Secondary | ICD-10-CM | POA: Diagnosis not present

## 2023-10-24 DIAGNOSIS — B351 Tinea unguium: Secondary | ICD-10-CM | POA: Diagnosis not present

## 2023-10-24 DIAGNOSIS — I7091 Generalized atherosclerosis: Secondary | ICD-10-CM | POA: Diagnosis not present

## 2023-10-26 DIAGNOSIS — R5381 Other malaise: Secondary | ICD-10-CM | POA: Diagnosis not present

## 2023-10-26 DIAGNOSIS — D649 Anemia, unspecified: Secondary | ICD-10-CM | POA: Diagnosis not present

## 2023-10-26 DIAGNOSIS — J9601 Acute respiratory failure with hypoxia: Secondary | ICD-10-CM | POA: Diagnosis not present

## 2023-10-28 DIAGNOSIS — F339 Major depressive disorder, recurrent, unspecified: Secondary | ICD-10-CM | POA: Diagnosis not present

## 2023-10-28 DIAGNOSIS — F0390 Unspecified dementia without behavioral disturbance: Secondary | ICD-10-CM | POA: Diagnosis not present

## 2023-10-28 DIAGNOSIS — F419 Anxiety disorder, unspecified: Secondary | ICD-10-CM | POA: Diagnosis not present

## 2023-11-04 DIAGNOSIS — F339 Major depressive disorder, recurrent, unspecified: Secondary | ICD-10-CM | POA: Diagnosis not present

## 2023-11-04 DIAGNOSIS — F0390 Unspecified dementia without behavioral disturbance: Secondary | ICD-10-CM | POA: Diagnosis not present

## 2023-11-04 DIAGNOSIS — J9601 Acute respiratory failure with hypoxia: Secondary | ICD-10-CM | POA: Diagnosis not present

## 2023-11-04 DIAGNOSIS — F419 Anxiety disorder, unspecified: Secondary | ICD-10-CM | POA: Diagnosis not present

## 2023-11-04 DIAGNOSIS — D649 Anemia, unspecified: Secondary | ICD-10-CM | POA: Diagnosis not present

## 2023-11-04 DIAGNOSIS — R5381 Other malaise: Secondary | ICD-10-CM | POA: Diagnosis not present

## 2023-11-05 DIAGNOSIS — R5381 Other malaise: Secondary | ICD-10-CM | POA: Diagnosis not present

## 2023-11-05 DIAGNOSIS — M810 Age-related osteoporosis without current pathological fracture: Secondary | ICD-10-CM | POA: Diagnosis not present

## 2023-11-05 DIAGNOSIS — Z7189 Other specified counseling: Secondary | ICD-10-CM | POA: Diagnosis not present

## 2023-11-05 DIAGNOSIS — R41841 Cognitive communication deficit: Secondary | ICD-10-CM | POA: Diagnosis not present

## 2023-11-05 DIAGNOSIS — S32000D Wedge compression fracture of unspecified lumbar vertebra, subsequent encounter for fracture with routine healing: Secondary | ICD-10-CM | POA: Diagnosis not present

## 2023-11-05 DIAGNOSIS — R4189 Other symptoms and signs involving cognitive functions and awareness: Secondary | ICD-10-CM | POA: Diagnosis not present

## 2023-11-05 DIAGNOSIS — S32120D Nondisplaced Zone II fracture of sacrum, subsequent encounter for fracture with routine healing: Secondary | ICD-10-CM | POA: Diagnosis not present

## 2023-11-05 DIAGNOSIS — Z79899 Other long term (current) drug therapy: Secondary | ICD-10-CM | POA: Diagnosis not present

## 2023-11-06 ENCOUNTER — Telehealth: Payer: Self-pay

## 2023-11-06 DIAGNOSIS — M6281 Muscle weakness (generalized): Secondary | ICD-10-CM | POA: Diagnosis not present

## 2023-11-06 DIAGNOSIS — F802 Mixed receptive-expressive language disorder: Secondary | ICD-10-CM | POA: Diagnosis not present

## 2023-11-06 NOTE — Transitions of Care (Post Inpatient/ED Visit) (Signed)
 11/06/2023  Name: Mikayla Wilson MRN: [id number] DOB: 1931-06-10  Today's TOC FU Call Status: Today's TOC FU Call Status:: Successful TOC FU Call Completed TOC FU Call Complete Date: 11/06/23 Patient's Name and Date of Birth confirmed.  Transition Care Management Follow-up Telephone Call Date of Discharge: 11/05/23 Discharge Facility: Other ([facility]) Name of Other ([facility]) Discharge Facility: [facility] Type of Discharge: Inpatient Admission Primary Inpatient Discharge Diagnosis:: lack of coordination How have you been since you were released from the hospital?: Same Any questions or concerns?: No  Items Reviewed: Did you receive and understand the discharge instructions provided?: Yes Medications obtained,verified, and reconciled?: Yes (Medications Reviewed) Any new allergies since your discharge?: No Dietary orders reviewed?: Yes Do you have support at home?: Yes People in Home [RPT]: facility resident Name of Support/Comfort Primary Source: [name]  Medications Reviewed Today: Medications Reviewed Today     Reviewed by Darrall Ellison, LPN (Licensed Practical Nurse) on 11/06/23 at 1056  Med List Status: <None>   Medication Order Taking? Sig Documenting Provider Last Dose Status Informant  acetaminophen  (TYLENOL ) 500 MG tablet [id number]  Take 2 tablets (1,000 mg total) by mouth every 8 (eight) hours. Demaris Fillers, MD  Active   cefadroxil  (DURICEF) 500 MG capsule [id number]  Take 1 capsule (500 mg total) by mouth 2 (two) times daily. X 2 doses Tat, David, MD  Active   cyanocobalamin  (VITAMIN B12) 500 MCG tablet [id number]  Take 1 tablet (500 mcg total) by mouth daily. Demaris Fillers, MD  Active   escitalopram  (LEXAPRO ) 5 MG tablet [id number] No Take 1 tablet (5 mg total) by mouth daily. Dettinger, Lucio Sabin, MD 09/01/2023 Morning Active Child  famotidine  (PEPCID ) 20 MG tablet [id number] No Take 1 tablet (20 mg total) by mouth 2 (two) times daily. Dettinger, Lucio Sabin,  MD 09/01/2023 Bedtime Active Child  feeding supplement (ENSURE ENLIVE / ENSURE PLUS) LIQD [id number]  Take 237 mLs by mouth 2 (two) times daily between meals. Demaris Fillers, MD  Active   ketotifen  (ZADITOR ) 0.025 % ophthalmic solution [id number] No Place 1 drop into both eyes 6 (six) times daily. [provider] Past Month Active Child  loratadine  (EQ LORATADINE ) 10 MG tablet [id number] No Take 1 tablet (10 mg total) by mouth daily. TAKE 1 TABLET BY MOUTH ONCE DAILY AS NEEDED FOR ALLERGIES Dettinger, Lucio Sabin, MD 09/01/2023 Morning Active Child  LORazepam  (ATIVAN ) 1 MG tablet [id number]  Take 1 tablet (1 mg total) by mouth every morning. Demaris Fillers, MD  Active   omeprazole  (PRILOSEC) 20 MG capsule [id number] No Take 1 capsule (20 mg total) by mouth daily. Dettinger, Lucio Sabin, MD 09/01/2023 Morning Active Child  potassium chloride  (KLOR-CON  M) 10 MEQ tablet [id number] No Take 1 tablet (10 mEq total) by mouth daily. Dettinger, Lucio Sabin, MD 09/01/2023 Morning Active Child  rosuvastatin  (CRESTOR ) 5 MG tablet [id number] No Take 1 tablet (5 mg total) by mouth daily. Dettinger, Lucio Sabin, MD 09/01/2023 Morning Active Child            Home Care and Equipment/Supplies: Were Home Health Services Ordered?: NA Any new equipment or medical supplies ordered?: NA  Functional Questionnaire: Do you need assistance with bathing/showering or dressing?: Yes Do you need assistance with meal preparation?: Yes Do you need assistance with eating?: Yes Do you have difficulty maintaining continence: Yes Do you need assistance with getting out of bed/getting out of a chair/moving?: Yes Do you have difficulty managing or taking your medications?: Yes  Follow up appointments  reviewed: PCP Follow-up appointment confirmed?: No (declined) MD Provider Line [phone] Given: No Specialist Hospital Follow-up appointment confirmed?: NA Do you need transportation to your follow-up appointment?: No Do you understand  care options if your condition(s) worsen?: Yes-patient verbalized understanding  resident at [name]  SIGNATURE [name], LPN [facility] Nurse Health Advisor Direct Dial [phone]

## 2023-11-09 DIAGNOSIS — F32A Depression, unspecified: Secondary | ICD-10-CM | POA: Diagnosis not present

## 2023-11-09 DIAGNOSIS — M6281 Muscle weakness (generalized): Secondary | ICD-10-CM | POA: Diagnosis not present

## 2023-11-09 DIAGNOSIS — K219 Gastro-esophageal reflux disease without esophagitis: Secondary | ICD-10-CM | POA: Diagnosis not present

## 2023-11-09 DIAGNOSIS — E559 Vitamin D deficiency, unspecified: Secondary | ICD-10-CM | POA: Diagnosis not present

## 2023-11-09 DIAGNOSIS — R451 Restlessness and agitation: Secondary | ICD-10-CM | POA: Diagnosis not present

## 2023-11-09 DIAGNOSIS — E785 Hyperlipidemia, unspecified: Secondary | ICD-10-CM | POA: Diagnosis not present

## 2023-11-09 DIAGNOSIS — I10 Essential (primary) hypertension: Secondary | ICD-10-CM | POA: Diagnosis not present

## 2023-11-09 DIAGNOSIS — Z79899 Other long term (current) drug therapy: Secondary | ICD-10-CM | POA: Diagnosis not present

## 2023-11-09 DIAGNOSIS — Z1321 Encounter for screening for nutritional disorder: Secondary | ICD-10-CM | POA: Diagnosis not present

## 2023-11-09 DIAGNOSIS — F802 Mixed receptive-expressive language disorder: Secondary | ICD-10-CM | POA: Diagnosis not present

## 2023-11-09 DIAGNOSIS — J45909 Unspecified asthma, uncomplicated: Secondary | ICD-10-CM | POA: Diagnosis not present

## 2023-11-09 DIAGNOSIS — F419 Anxiety disorder, unspecified: Secondary | ICD-10-CM | POA: Diagnosis not present

## 2023-11-10 DIAGNOSIS — F0390 Unspecified dementia without behavioral disturbance: Secondary | ICD-10-CM | POA: Diagnosis not present

## 2023-11-10 DIAGNOSIS — M6281 Muscle weakness (generalized): Secondary | ICD-10-CM | POA: Diagnosis not present

## 2023-11-10 DIAGNOSIS — I10 Essential (primary) hypertension: Secondary | ICD-10-CM | POA: Diagnosis not present

## 2023-11-10 DIAGNOSIS — Z79899 Other long term (current) drug therapy: Secondary | ICD-10-CM | POA: Diagnosis not present

## 2023-11-10 DIAGNOSIS — F802 Mixed receptive-expressive language disorder: Secondary | ICD-10-CM | POA: Diagnosis not present

## 2023-11-10 DIAGNOSIS — F419 Anxiety disorder, unspecified: Secondary | ICD-10-CM | POA: Diagnosis not present

## 2023-11-11 DIAGNOSIS — D649 Anemia, unspecified: Secondary | ICD-10-CM | POA: Diagnosis not present

## 2023-11-11 DIAGNOSIS — F0390 Unspecified dementia without behavioral disturbance: Secondary | ICD-10-CM | POA: Diagnosis not present

## 2023-11-11 DIAGNOSIS — F802 Mixed receptive-expressive language disorder: Secondary | ICD-10-CM | POA: Diagnosis not present

## 2023-11-11 DIAGNOSIS — J45909 Unspecified asthma, uncomplicated: Secondary | ICD-10-CM | POA: Diagnosis not present

## 2023-11-11 DIAGNOSIS — K219 Gastro-esophageal reflux disease without esophagitis: Secondary | ICD-10-CM | POA: Diagnosis not present

## 2023-11-11 DIAGNOSIS — F419 Anxiety disorder, unspecified: Secondary | ICD-10-CM | POA: Diagnosis not present

## 2023-11-11 DIAGNOSIS — R4189 Other symptoms and signs involving cognitive functions and awareness: Secondary | ICD-10-CM | POA: Diagnosis not present

## 2023-11-11 DIAGNOSIS — I10 Essential (primary) hypertension: Secondary | ICD-10-CM | POA: Diagnosis not present

## 2023-11-11 DIAGNOSIS — M6281 Muscle weakness (generalized): Secondary | ICD-10-CM | POA: Diagnosis not present

## 2023-11-11 DIAGNOSIS — E785 Hyperlipidemia, unspecified: Secondary | ICD-10-CM | POA: Diagnosis not present

## 2023-11-11 DIAGNOSIS — S32000D Wedge compression fracture of unspecified lumbar vertebra, subsequent encounter for fracture with routine healing: Secondary | ICD-10-CM | POA: Diagnosis not present

## 2023-11-12 DIAGNOSIS — F802 Mixed receptive-expressive language disorder: Secondary | ICD-10-CM | POA: Diagnosis not present

## 2023-11-12 DIAGNOSIS — M6281 Muscle weakness (generalized): Secondary | ICD-10-CM | POA: Diagnosis not present

## 2023-11-13 DIAGNOSIS — M6281 Muscle weakness (generalized): Secondary | ICD-10-CM | POA: Diagnosis not present

## 2023-11-13 DIAGNOSIS — F802 Mixed receptive-expressive language disorder: Secondary | ICD-10-CM | POA: Diagnosis not present

## 2023-11-16 DIAGNOSIS — F802 Mixed receptive-expressive language disorder: Secondary | ICD-10-CM | POA: Diagnosis not present

## 2023-11-16 DIAGNOSIS — M6281 Muscle weakness (generalized): Secondary | ICD-10-CM | POA: Diagnosis not present

## 2023-11-17 DIAGNOSIS — M810 Age-related osteoporosis without current pathological fracture: Secondary | ICD-10-CM | POA: Diagnosis not present

## 2023-11-17 DIAGNOSIS — E559 Vitamin D deficiency, unspecified: Secondary | ICD-10-CM | POA: Diagnosis not present

## 2023-11-17 DIAGNOSIS — F802 Mixed receptive-expressive language disorder: Secondary | ICD-10-CM | POA: Diagnosis not present

## 2023-11-17 DIAGNOSIS — Z79899 Other long term (current) drug therapy: Secondary | ICD-10-CM | POA: Diagnosis not present

## 2023-11-17 DIAGNOSIS — M6281 Muscle weakness (generalized): Secondary | ICD-10-CM | POA: Diagnosis not present

## 2023-11-18 DIAGNOSIS — M6281 Muscle weakness (generalized): Secondary | ICD-10-CM | POA: Diagnosis not present

## 2023-11-18 DIAGNOSIS — F802 Mixed receptive-expressive language disorder: Secondary | ICD-10-CM | POA: Diagnosis not present

## 2023-11-19 DIAGNOSIS — R058 Other specified cough: Secondary | ICD-10-CM | POA: Diagnosis not present

## 2023-11-19 DIAGNOSIS — M6281 Muscle weakness (generalized): Secondary | ICD-10-CM | POA: Diagnosis not present

## 2023-11-19 DIAGNOSIS — Z79899 Other long term (current) drug therapy: Secondary | ICD-10-CM | POA: Diagnosis not present

## 2023-11-19 DIAGNOSIS — F802 Mixed receptive-expressive language disorder: Secondary | ICD-10-CM | POA: Diagnosis not present

## 2023-11-20 DIAGNOSIS — F01C18 Vascular dementia, severe, with other behavioral disturbance: Secondary | ICD-10-CM | POA: Diagnosis not present

## 2023-11-20 DIAGNOSIS — M6281 Muscle weakness (generalized): Secondary | ICD-10-CM | POA: Diagnosis not present

## 2023-11-20 DIAGNOSIS — F411 Generalized anxiety disorder: Secondary | ICD-10-CM | POA: Diagnosis not present

## 2023-11-20 DIAGNOSIS — R451 Restlessness and agitation: Secondary | ICD-10-CM | POA: Diagnosis not present

## 2023-11-20 DIAGNOSIS — F802 Mixed receptive-expressive language disorder: Secondary | ICD-10-CM | POA: Diagnosis not present

## 2023-11-23 DIAGNOSIS — M6281 Muscle weakness (generalized): Secondary | ICD-10-CM | POA: Diagnosis not present

## 2023-11-23 DIAGNOSIS — F802 Mixed receptive-expressive language disorder: Secondary | ICD-10-CM | POA: Diagnosis not present

## 2023-11-24 DIAGNOSIS — E039 Hypothyroidism, unspecified: Secondary | ICD-10-CM | POA: Diagnosis not present

## 2023-11-24 DIAGNOSIS — Z79899 Other long term (current) drug therapy: Secondary | ICD-10-CM | POA: Diagnosis not present

## 2023-11-24 DIAGNOSIS — E782 Mixed hyperlipidemia: Secondary | ICD-10-CM | POA: Diagnosis not present

## 2023-11-24 DIAGNOSIS — F802 Mixed receptive-expressive language disorder: Secondary | ICD-10-CM | POA: Diagnosis not present

## 2023-11-24 DIAGNOSIS — M6281 Muscle weakness (generalized): Secondary | ICD-10-CM | POA: Diagnosis not present

## 2023-11-25 ENCOUNTER — Emergency Department (HOSPITAL_COMMUNITY)

## 2023-11-25 ENCOUNTER — Encounter (HOSPITAL_COMMUNITY): Payer: Self-pay

## 2023-11-25 ENCOUNTER — Other Ambulatory Visit: Payer: Self-pay

## 2023-11-25 ENCOUNTER — Emergency Department (HOSPITAL_COMMUNITY)
Admission: EM | Admit: 2023-11-25 | Discharge: 2023-11-26 | Disposition: A | Discharge status: Skilled Nursing Facility | Attending: Emergency Medicine | Admitting: Emergency Medicine

## 2023-11-25 DIAGNOSIS — R918 Other nonspecific abnormal finding of lung field: Secondary | ICD-10-CM | POA: Diagnosis not present

## 2023-11-25 DIAGNOSIS — M25422 Effusion, left elbow: Secondary | ICD-10-CM | POA: Diagnosis not present

## 2023-11-25 DIAGNOSIS — F0390 Unspecified dementia without behavioral disturbance: Secondary | ICD-10-CM | POA: Insufficient documentation

## 2023-11-25 DIAGNOSIS — I10 Essential (primary) hypertension: Secondary | ICD-10-CM | POA: Diagnosis not present

## 2023-11-25 DIAGNOSIS — J45909 Unspecified asthma, uncomplicated: Secondary | ICD-10-CM | POA: Insufficient documentation

## 2023-11-25 DIAGNOSIS — R296 Repeated falls: Secondary | ICD-10-CM | POA: Diagnosis not present

## 2023-11-25 DIAGNOSIS — R509 Fever, unspecified: Secondary | ICD-10-CM | POA: Diagnosis not present

## 2023-11-25 DIAGNOSIS — I959 Hypotension, unspecified: Secondary | ICD-10-CM | POA: Diagnosis not present

## 2023-11-25 DIAGNOSIS — I2510 Atherosclerotic heart disease of native coronary artery without angina pectoris: Secondary | ICD-10-CM | POA: Insufficient documentation

## 2023-11-25 DIAGNOSIS — S52022A Displaced fracture of olecranon process without intraarticular extension of left ulna, initial encounter for closed fracture: Secondary | ICD-10-CM

## 2023-11-25 DIAGNOSIS — M25522 Pain in left elbow: Secondary | ICD-10-CM | POA: Diagnosis not present

## 2023-11-25 DIAGNOSIS — M79632 Pain in left forearm: Secondary | ICD-10-CM | POA: Diagnosis not present

## 2023-11-25 DIAGNOSIS — D72829 Elevated white blood cell count, unspecified: Secondary | ICD-10-CM | POA: Diagnosis not present

## 2023-11-25 DIAGNOSIS — S52252A Displaced comminuted fracture of shaft of ulna, left arm, initial encounter for closed fracture: Secondary | ICD-10-CM | POA: Diagnosis not present

## 2023-11-25 DIAGNOSIS — R0989 Other specified symptoms and signs involving the circulatory and respiratory systems: Secondary | ICD-10-CM | POA: Diagnosis not present

## 2023-11-25 DIAGNOSIS — M8580 Other specified disorders of bone density and structure, unspecified site: Secondary | ICD-10-CM | POA: Diagnosis not present

## 2023-11-25 DIAGNOSIS — M85832 Other specified disorders of bone density and structure, left forearm: Secondary | ICD-10-CM | POA: Diagnosis not present

## 2023-11-25 DIAGNOSIS — F802 Mixed receptive-expressive language disorder: Secondary | ICD-10-CM | POA: Diagnosis not present

## 2023-11-25 DIAGNOSIS — J189 Pneumonia, unspecified organism: Secondary | ICD-10-CM | POA: Diagnosis not present

## 2023-11-25 DIAGNOSIS — S52032A Displaced fracture of olecranon process with intraarticular extension of left ulna, initial encounter for closed fracture: Secondary | ICD-10-CM | POA: Insufficient documentation

## 2023-11-25 DIAGNOSIS — W19XXXA Unspecified fall, initial encounter: Secondary | ICD-10-CM

## 2023-11-25 DIAGNOSIS — W1830XA Fall on same level, unspecified, initial encounter: Secondary | ICD-10-CM | POA: Insufficient documentation

## 2023-11-25 DIAGNOSIS — R059 Cough, unspecified: Secondary | ICD-10-CM | POA: Diagnosis not present

## 2023-11-25 DIAGNOSIS — I700 Atherosclerosis of aorta: Secondary | ICD-10-CM | POA: Diagnosis not present

## 2023-11-25 DIAGNOSIS — M79605 Pain in left leg: Secondary | ICD-10-CM | POA: Diagnosis not present

## 2023-11-25 DIAGNOSIS — R0602 Shortness of breath: Secondary | ICD-10-CM | POA: Diagnosis not present

## 2023-11-25 DIAGNOSIS — R000 Tachycardia, unspecified: Secondary | ICD-10-CM | POA: Diagnosis not present

## 2023-11-25 DIAGNOSIS — Z043 Encounter for examination and observation following other accident: Secondary | ICD-10-CM | POA: Diagnosis not present

## 2023-11-25 DIAGNOSIS — J168 Pneumonia due to other specified infectious organisms: Secondary | ICD-10-CM | POA: Diagnosis not present

## 2023-11-25 DIAGNOSIS — S4992XA Unspecified injury of left shoulder and upper arm, initial encounter: Secondary | ICD-10-CM | POA: Diagnosis present

## 2023-11-25 DIAGNOSIS — Z79899 Other long term (current) drug therapy: Secondary | ICD-10-CM | POA: Insufficient documentation

## 2023-11-25 DIAGNOSIS — I4510 Unspecified right bundle-branch block: Secondary | ICD-10-CM | POA: Insufficient documentation

## 2023-11-25 DIAGNOSIS — M6281 Muscle weakness (generalized): Secondary | ICD-10-CM | POA: Diagnosis not present

## 2023-11-25 DIAGNOSIS — R9431 Abnormal electrocardiogram [ECG] [EKG]: Secondary | ICD-10-CM | POA: Diagnosis not present

## 2023-11-25 LAB — BRAIN NATRIURETIC PEPTIDE: B Natriuretic Peptide: 43 pg/mL (ref 0.0–100.0)

## 2023-11-25 LAB — CBC WITH DIFFERENTIAL/PLATELET
Abs Immature Granulocytes: 0.05 10*3/uL (ref 0.00–0.07)
Basophils Absolute: 0 10*3/uL (ref 0.0–0.1)
Basophils Relative: 0 %
Eosinophils Absolute: 1.1 10*3/uL — ABNORMAL HIGH (ref 0.0–0.5)
Eosinophils Relative: 10 %
HCT: 35 % — ABNORMAL LOW (ref 36.0–46.0)
Hemoglobin: 11.4 g/dL — ABNORMAL LOW (ref 12.0–15.0)
Immature Granulocytes: 1 %
Lymphocytes Relative: 20 %
Lymphs Abs: 2.3 10*3/uL (ref 0.7–4.0)
MCH: 31.1 pg (ref 26.0–34.0)
MCHC: 32.6 g/dL (ref 30.0–36.0)
MCV: 95.6 fL (ref 80.0–100.0)
Monocytes Absolute: 0.7 10*3/uL (ref 0.1–1.0)
Monocytes Relative: 6 %
Neutro Abs: 6.9 10*3/uL (ref 1.7–7.7)
Neutrophils Relative %: 63 %
Platelets: 194 10*3/uL (ref 150–400)
RBC: 3.66 MIL/uL — ABNORMAL LOW (ref 3.87–5.11)
RDW: 12.1 % (ref 11.5–15.5)
WBC: 11 10*3/uL — ABNORMAL HIGH (ref 4.0–10.5)
nRBC: 0 % (ref 0.0–0.2)

## 2023-11-25 LAB — I-STAT CHEM 8, ED
BUN: 15 mg/dL (ref 8–23)
Calcium, Ion: 1.12 mmol/L — ABNORMAL LOW (ref 1.15–1.40)
Chloride: 104 mmol/L (ref 98–111)
Creatinine, Ser: 0.7 mg/dL (ref 0.44–1.00)
Glucose, Bld: 120 mg/dL — ABNORMAL HIGH (ref 70–99)
HCT: 34 % — ABNORMAL LOW (ref 36.0–46.0)
Hemoglobin: 11.6 g/dL — ABNORMAL LOW (ref 12.0–15.0)
Potassium: 3.9 mmol/L (ref 3.5–5.1)
Sodium: 139 mmol/L (ref 135–145)
TCO2: 23 mmol/L (ref 22–32)

## 2023-11-25 LAB — RESP PANEL BY RT-PCR (RSV, FLU A&B, COVID)  RVPGX2
Influenza A by PCR: NEGATIVE
Influenza B by PCR: NEGATIVE
Resp Syncytial Virus by PCR: NEGATIVE
SARS Coronavirus 2 by RT PCR: NEGATIVE

## 2023-11-25 MED ORDER — SODIUM CHLORIDE 0.9 % IV SOLN
1.0000 g | Freq: Once | INTRAVENOUS | Status: AC
  Administered 2023-11-25: 1 g via INTRAVENOUS
  Filled 2023-11-25: qty 10

## 2023-11-25 MED ORDER — SODIUM CHLORIDE 0.9 % IV SOLN
500.0000 mg | INTRAVENOUS | Status: DC
  Administered 2023-11-26: 500 mg via INTRAVENOUS
  Filled 2023-11-25: qty 5

## 2023-11-25 MED ORDER — SODIUM CHLORIDE 0.9 % IV BOLUS
1000.0000 mL | Freq: Once | INTRAVENOUS | Status: AC
  Administered 2023-11-25: 1000 mL via INTRAVENOUS

## 2023-11-25 NOTE — ED Provider Notes (Signed)
 [place] EMERGENCY DEPARTMENT AT [facility] Provider Note   CSN: [id number] Arrival date & time: 11/25/23  2118     History  Chief Complaint  Patient presents with   Arm Injury    Mikayla Wilson is a 89 y.o. female.  Pt is a 89 yo female with pmhx significant for dementia, cad, asthma, gerd, and arthritis.  Pt slid out of her wheelchair on Sunday, 5/11, but did not injure herself.  She fell again last night and landed on her left elbow.  Family is unsure if she hit her head.  No blood thinners.  Pt is unable to give any hx due to severe dementia.  Family also report that she's had fever and cough.         Home Medications Prior to Admission medications   Medication Sig Start Date End Date Taking? Authorizing Provider  acetaminophen  (TYLENOL ) 500 MG tablet Take 2 tablets (1,000 mg total) by mouth every 8 (eight) hours. 09/08/23   Demaris Fillers, MD  cefadroxil  (DURICEF) 500 MG capsule Take 1 capsule (500 mg total) by mouth 2 (two) times daily. X 2 doses 09/08/23   Tat, Myrtie Atkinson, MD  cyanocobalamin  (VITAMIN B12) 500 MCG tablet Take 1 tablet (500 mcg total) by mouth daily. 09/09/23   Demaris Fillers, MD  escitalopram  (LEXAPRO ) 5 MG tablet Take 1 tablet (5 mg total) by mouth daily. 06/10/23   Dettinger, Lucio Sabin, MD  famotidine  (PEPCID ) 20 MG tablet Take 1 tablet (20 mg total) by mouth 2 (two) times daily. 06/10/23   Dettinger, Lucio Sabin, MD  feeding supplement (ENSURE ENLIVE / ENSURE PLUS) LIQD Take 237 mLs by mouth 2 (two) times daily between meals. 09/08/23   Demaris Fillers, MD  ketotifen  (ZADITOR ) 0.025 % ophthalmic solution Place 1 drop into both eyes 6 (six) times daily.    [provider]  loratadine  (EQ LORATADINE ) 10 MG tablet Take 1 tablet (10 mg total) by mouth daily. TAKE 1 TABLET BY MOUTH ONCE DAILY AS NEEDED FOR ALLERGIES 06/10/23   Dettinger, Lucio Sabin, MD  LORazepam  (ATIVAN ) 1 MG tablet Take 1 tablet (1 mg total) by mouth every morning. 09/09/23   Demaris Fillers, MD   omeprazole  (PRILOSEC) 20 MG capsule Take 1 capsule (20 mg total) by mouth daily. 06/10/23   Dettinger, Lucio Sabin, MD  potassium chloride  (KLOR-CON  M) 10 MEQ tablet Take 1 tablet (10 mEq total) by mouth daily. 06/10/23   Dettinger, Lucio Sabin, MD  rosuvastatin  (CRESTOR ) 5 MG tablet Take 1 tablet (5 mg total) by mouth daily. 06/10/23   Dettinger, Lucio Sabin, MD      Allergies    Aspirin and Morphine and codeine    Review of Systems   Review of Systems  Unable to perform ROS: Dementia  All other systems reviewed and are negative.   Physical Exam Updated Vital Signs BP 97/61   Pulse 68   Temp (!) 97.2 F (36.2 C) (Axillary)   Resp 16   Ht 5\' 4"  (1.626 m)   Wt 59.9 kg   SpO2 97%   BMI 22.66 kg/m  Physical Exam Vitals and nursing note reviewed.  Constitutional:      Appearance: Normal appearance.  HENT:     Head: Normocephalic and atraumatic.     Right Ear: External ear normal.     Left Ear: External ear normal.     Nose: Nose normal.     Mouth/Throat:     Mouth: Mucous membranes are moist.  Pharynx: Oropharynx is clear.  Eyes:     Extraocular Movements: Extraocular movements intact.     Conjunctiva/sclera: Conjunctivae normal.     Pupils: Pupils are equal, round, and reactive to light.  Cardiovascular:     Rate and Rhythm: Normal rate and regular rhythm.     Pulses: Normal pulses.     Heart sounds: Normal heart sounds.  Pulmonary:     Effort: Pulmonary effort is normal.     Breath sounds: Normal breath sounds.  Abdominal:     General: Abdomen is flat. Bowel sounds are normal.     Palpations: Abdomen is soft.  Musculoskeletal:       Arms:     Cervical back: Normal range of motion and neck supple.  Skin:    General: Skin is warm.     Capillary Refill: Capillary refill takes less than 2 seconds.  Neurological:     Mental Status: She is alert. Mental status is at baseline. She is disoriented.  Psychiatric:        Mood and Affect: Mood normal.        Behavior:  Behavior normal.     ED Results / Procedures / Treatments   Labs (all labs ordered are listed, but only abnormal results are displayed) Labs Reviewed  CBC WITH DIFFERENTIAL/PLATELET - Abnormal; Notable for the following components:      Result Value   WBC 11.0 (*)    RBC 3.66 (*)    Hemoglobin 11.4 (*)    HCT 35.0 (*)    Eosinophils Absolute 1.1 (*)    All other components within normal limits  I-STAT CHEM 8, ED - Abnormal; Notable for the following components:   Glucose, Bld 120 (*)    Calcium , Ion 1.12 (*)    Hemoglobin 11.6 (*)    HCT 34.0 (*)    All other components within normal limits  CULTURE, BLOOD (ROUTINE X 2)  CULTURE, BLOOD (ROUTINE X 2)  RESP PANEL BY RT-PCR (RSV, FLU A&B, COVID)  RVPGX2  BASIC METABOLIC PANEL WITH GFR  BRAIN NATRIURETIC PEPTIDE  URINALYSIS, W/ REFLEX TO CULTURE (INFECTION SUSPECTED)  LACTIC ACID, PLASMA  LACTIC ACID, PLASMA    EKG EKG Interpretation Date/Time:  Wednesday Nov 25 2023 22:29:54 EDT Ventricular Rate:  97 PR Interval:  215 QRS Duration:  127 QT Interval:  382 QTC Calculation: 486 R Axis:   -37  Text Interpretation: Sinus rhythm Prolonged PR interval Right bundle branch block No significant change since last tracing Confirmed by Sueellen Emery [phone]) on 11/25/2023 11:10:03 PM  Radiology CT Head Wo Contrast Result Date: 11/25/2023 CLINICAL DATA:  Fall EXAM: CT HEAD WITHOUT CONTRAST CT CERVICAL SPINE WITHOUT CONTRAST TECHNIQUE: Multidetector CT imaging of the head and cervical spine was performed following the standard protocol without intravenous contrast. Multiplanar CT image reconstructions of the cervical spine were also generated. RADIATION DOSE REDUCTION: This exam was performed according to the departmental dose-optimization program which includes automated exposure control, adjustment of the mA and/or kV according to patient size and/or use of iterative reconstruction technique. COMPARISON:  None Available. FINDINGS: CT  HEAD FINDINGS Brain: There is no mass, hemorrhage or extra-axial collection. The size and configuration of the ventricles and extra-axial CSF spaces are normal. There is hypoattenuation of the white matter, most commonly indicating chronic small vessel disease. Incidental partially empty sella turcica. Vascular: No hyperdense vessel or unexpected vascular calcification. Skull: The visualized skull base, calvarium and extracranial soft tissues are normal. Sinuses/Orbits: No fluid levels or  advanced mucosal thickening of the visualized paranasal sinuses. No mastoid or middle ear effusion. Normal orbits. Other: None. CT CERVICAL SPINE FINDINGS Alignment: No static subluxation. Facets are aligned. Occipital condyles are normally positioned. Skull base and vertebrae: No acute fracture. Soft tissues and spinal canal: No prevertebral fluid or swelling. No visible canal hematoma. Disc levels: No advanced spinal canal or neural foraminal stenosis. Upper chest: No pneumothorax, pulmonary nodule or pleural effusion. Other: Normal visualized paraspinal cervical soft tissues. IMPRESSION: 1. No acute intracranial abnormality. 2. Chronic small vessel disease. 3. No acute fracture or static subluxation of the cervical spine. Electronically Signed   By: Juanetta Nordmann M.D.   On: 11/25/2023 23:26   CT Cervical Spine Wo Contrast Result Date: 11/25/2023 CLINICAL DATA:  Fall EXAM: CT HEAD WITHOUT CONTRAST CT CERVICAL SPINE WITHOUT CONTRAST TECHNIQUE: Multidetector CT imaging of the head and cervical spine was performed following the standard protocol without intravenous contrast. Multiplanar CT image reconstructions of the cervical spine were also generated. RADIATION DOSE REDUCTION: This exam was performed according to the departmental dose-optimization program which includes automated exposure control, adjustment of the mA and/or kV according to patient size and/or use of iterative reconstruction technique. COMPARISON:  None  Available. FINDINGS: CT HEAD FINDINGS Brain: There is no mass, hemorrhage or extra-axial collection. The size and configuration of the ventricles and extra-axial CSF spaces are normal. There is hypoattenuation of the white matter, most commonly indicating chronic small vessel disease. Incidental partially empty sella turcica. Vascular: No hyperdense vessel or unexpected vascular calcification. Skull: The visualized skull base, calvarium and extracranial soft tissues are normal. Sinuses/Orbits: No fluid levels or advanced mucosal thickening of the visualized paranasal sinuses. No mastoid or middle ear effusion. Normal orbits. Other: None. CT CERVICAL SPINE FINDINGS Alignment: No static subluxation. Facets are aligned. Occipital condyles are normally positioned. Skull base and vertebrae: No acute fracture. Soft tissues and spinal canal: No prevertebral fluid or swelling. No visible canal hematoma. Disc levels: No advanced spinal canal or neural foraminal stenosis. Upper chest: No pneumothorax, pulmonary nodule or pleural effusion. Other: Normal visualized paraspinal cervical soft tissues. IMPRESSION: 1. No acute intracranial abnormality. 2. Chronic small vessel disease. 3. No acute fracture or static subluxation of the cervical spine. Electronically Signed   By: Juanetta Nordmann M.D.   On: 11/25/2023 23:26    Procedures Procedures    Medications Ordered in ED Medications  azithromycin  (ZITHROMAX ) 500 mg in sodium chloride  0.9 % 250 mL IVPB (has no administration in time range)  sodium chloride  0.9 % bolus 1,000 mL (1,000 mLs Intravenous New Bag/Given 11/25/23 2251)  cefTRIAXone  (ROCEPHIN ) 1 g in sodium chloride  0.9 % 100 mL IVPB (1 g Intravenous New Bag/Given 11/25/23 2254)    ED Course/ Medical Decision Making/ A&P                                 Medical Decision Making Amount and/or Complexity of Data Reviewed Labs: ordered. Radiology: ordered.   This patient presents to the ED for concern of  fall/cough, this involves an extensive number of treatment options, and is a complaint that carries with it a high risk of complications and morbidity.  The differential diagnosis includes fx, infection, contusion   Co morbidities that complicate the patient evaluation  dementia, cad, asthma, gerd, and arthritis.   Additional history obtained:  Additional history obtained from epic chart review External records from outside source obtained and reviewed including  EMS report/family   Lab Tests:  I Ordered, and personally interpreted labs.  The pertinent results include:  cbc with wbc elevated at 11, istat chem 8 nl    Imaging Studies ordered:  I ordered imaging studies including ct head/ct cervica, left arm, cxr, pelvis  I independently visualized and interpreted imaging which showed  CT head/c-spine: No acute intracranial abnormality.  2. Chronic small vessel disease.  3. No acute fracture or static subluxation of the cervical spine.  Other xrays pending at shift change  I agree with the radiologist interpretation   Cardiac Monitoring:  The patient was maintained on a cardiac monitor.  I personally viewed and interpreted the cardiac monitored which showed an underlying rhythm of: nsr   Medicines ordered and prescription drug management:  I ordered medication including ivfs/rocephin /zithromax   for sx  Reevaluation of the patient after these medicines showed that the patient improved I have reviewed the patients home medicines and have made adjustments as needed   Test Considered:  ct   Critical Interventions:  abx  Problem List / ED Course:  CAP:  pt started on rocephin /zithromax  Left olecranon fx:  long arm splint   Social Determinants of Health:  Lives in a facility   Dispostion:  Pending at shift change        Final Clinical Impression(s) / ED Diagnoses Final diagnoses:  Fall, initial encounter    Rx / DC Orders ED Discharge Orders      None         Sueellen Emery, MD 11/25/23 2335

## 2023-11-25 NOTE — ED Triage Notes (Signed)
 Pt arrived from [facility] s/p fall last night. Facility ordered xrays, per facility pt has fx of left elbow and left forearm. Pt oriented to self only at baseline

## 2023-11-26 DIAGNOSIS — R4182 Altered mental status, unspecified: Secondary | ICD-10-CM | POA: Diagnosis not present

## 2023-11-26 DIAGNOSIS — Z79899 Other long term (current) drug therapy: Secondary | ICD-10-CM | POA: Diagnosis not present

## 2023-11-26 DIAGNOSIS — J189 Pneumonia, unspecified organism: Secondary | ICD-10-CM | POA: Diagnosis not present

## 2023-11-26 DIAGNOSIS — S52032A Displaced fracture of olecranon process with intraarticular extension of left ulna, initial encounter for closed fracture: Secondary | ICD-10-CM | POA: Diagnosis not present

## 2023-11-26 DIAGNOSIS — I959 Hypotension, unspecified: Secondary | ICD-10-CM | POA: Diagnosis not present

## 2023-11-26 DIAGNOSIS — F802 Mixed receptive-expressive language disorder: Secondary | ICD-10-CM | POA: Diagnosis not present

## 2023-11-26 DIAGNOSIS — S52022D Displaced fracture of olecranon process without intraarticular extension of left ulna, subsequent encounter for closed fracture with routine healing: Secondary | ICD-10-CM | POA: Diagnosis not present

## 2023-11-26 DIAGNOSIS — M6281 Muscle weakness (generalized): Secondary | ICD-10-CM | POA: Diagnosis not present

## 2023-11-26 LAB — BASIC METABOLIC PANEL WITH GFR
Anion gap: 9 (ref 5–15)
BUN: 14 mg/dL (ref 8–23)
CO2: 24 mmol/L (ref 22–32)
Calcium: 8.4 mg/dL — ABNORMAL LOW (ref 8.9–10.3)
Chloride: 103 mmol/L (ref 98–111)
Creatinine, Ser: 0.74 mg/dL (ref 0.44–1.00)
GFR, Estimated: >60 mL/min (ref >60)
Glucose, Bld: 125 mg/dL — ABNORMAL HIGH (ref 70–99)
Potassium: 3.8 mmol/L (ref 3.5–5.1)
Sodium: 136 mmol/L (ref 135–145)

## 2023-11-26 LAB — LACTIC ACID, PLASMA
Lactic Acid, Venous: 1 mmol/L (ref 0.5–1.9)
Lactic Acid, Venous: 2 mmol/L (ref 0.5–1.9)

## 2023-11-26 MED ORDER — LORAZEPAM 2 MG/ML IJ SOLN
0.5000 mg | Freq: Once | INTRAMUSCULAR | Status: DC
  Filled 2023-11-26: qty 1

## 2023-11-26 MED ORDER — ACETAMINOPHEN 500 MG PO TABS: 500.0000 mg | ORAL_TABLET | Freq: Four times a day (QID) | ORAL | 0 refills | Status: DC | PRN

## 2023-11-26 MED ORDER — AMOXICILLIN-POT CLAVULANATE 875-125 MG PO TABS: 1.0000 | ORAL_TABLET | Freq: Two times a day (BID) | ORAL | 0 refills | Status: DC

## 2023-11-26 MED ORDER — LORAZEPAM 2 MG/ML IJ SOLN
0.5000 mg | Freq: Once | INTRAMUSCULAR | Status: AC
  Administered 2023-11-26: 0.5 mg via INTRAMUSCULAR

## 2023-11-26 MED ORDER — OXYCODONE-ACETAMINOPHEN 5-325 MG PO TABS: 1.0000 | ORAL_TABLET | Freq: Once | ORAL | Status: DC

## 2023-11-26 MED ORDER — FENTANYL CITRATE PF 50 MCG/ML IJ SOSY
50.0000 ug | PREFILLED_SYRINGE | Freq: Once | INTRAMUSCULAR | Status: AC
  Administered 2023-11-26: 50 ug via INTRAVENOUS
  Filled 2023-11-26: qty 1

## 2023-11-26 NOTE — ED Notes (Signed)
 Date and time results received: 11/26/23 0302 (use smartphrase ".now" to insert current time)  Test: Lactic Acid Critical Value: 2.0  Name of Provider Notified: C. Horton, MD

## 2023-11-26 NOTE — Discharge Instructions (Signed)
 You were seen today for fall.  You have evidence of an elbow fracture.  Follow-up with orthopedics.  Keep in splint.  You were also noted to potentially have a pneumonia.  Otherwise your workup was reassuring.  We started on antibiotics.  Follow-up for repeat x-ray when symptoms resolved to ensure resolution.

## 2023-11-26 NOTE — ED Notes (Signed)
 Pt agitated with hx of dementia, family requesting ativan  to calm her down, reports she is supposed to get it at bedtime. Dr Carylon Claude aware- new orders received.

## 2023-11-26 NOTE — ED Provider Notes (Signed)
 Patient signed out pending labs and reassessment.  In brief this is a 89 year old female who presents after fall.  Noted to have an elbow fracture as an outpatient.  She has what appears to be an olecranon fracture that is minimally displaced.  This was splinted and she was provided with a sling.  Additionally it appears that she has some right upper lobe changes on her chest x-ray.  She has reportedly have some cough.  COVID and influenza test negative.  Initial lactate 1.  Has a slight leukocytosis to 11.0.  She was given Rocephin  and azithromycin .  Patient resides in memory care.  She requires of Ativan  for agitation.  Clinically she is stable and not hypoxic.  Discussed with the family options including admission versus discharge back to the nursing facility with antibiotics.  I suspect she may become more delirious in the hospital.  Given that she is not hypoxic or septic appearing, feel it is reasonable for her to have outpatient antibiotics.  Will start on Augmentin .  Family is agreeable to plan.  Physical Exam  BP (!) 101/55   Pulse 95   Temp (!) 97.4 F (36.3 C) (Oral)   Resp 19   Ht 1.626 m (5\' 4" )   Wt 59.9 kg   SpO2 99%   BMI 22.66 kg/m   Physical Exam Elderly, chronically ill-appearing Left arm in splint Procedures  Procedures  ED Course / MDM    Medical Decision Making Amount and/or Complexity of Data Reviewed Labs: ordered. Radiology: ordered.  Risk OTC drugs. Prescription drug management.   Problem List Items Addressed This Visit   None Visit Diagnoses       Fall, initial encounter    -  Primary     Closed fracture of olecranon process of left ulna, initial encounter         Pneumonia of right upper lobe due to infectious organism       Relevant Medications   cefTRIAXone  (ROCEPHIN ) 1 g in sodium chloride  0.9 % 100 mL IVPB (Completed)   azithromycin  (ZITHROMAX ) 500 mg in sodium chloride  0.9 % 250 mL IVPB   amoxicillin -clavulanate (AUGMENTIN ) 875-125 MG  tablet             Rory Collard, MD 11/26/23 [phone]

## 2023-11-27 DIAGNOSIS — F411 Generalized anxiety disorder: Secondary | ICD-10-CM | POA: Diagnosis not present

## 2023-11-27 DIAGNOSIS — R451 Restlessness and agitation: Secondary | ICD-10-CM | POA: Diagnosis not present

## 2023-11-27 DIAGNOSIS — F01C18 Vascular dementia, severe, with other behavioral disturbance: Secondary | ICD-10-CM | POA: Diagnosis not present

## 2023-11-27 DIAGNOSIS — S52022A Displaced fracture of olecranon process without intraarticular extension of left ulna, initial encounter for closed fracture: Secondary | ICD-10-CM | POA: Diagnosis not present

## 2023-11-30 LAB — CULTURE, BLOOD (ROUTINE X 2)
Culture: NO GROWTH
Culture: NO GROWTH
Special Requests: ADEQUATE
Special Requests: ADEQUATE

## 2023-12-04 ENCOUNTER — Ambulatory Visit (INDEPENDENT_AMBULATORY_CARE_PROVIDER_SITE_OTHER): Admitting: Orthopedic Surgery

## 2023-12-04 ENCOUNTER — Encounter: Payer: Self-pay | Admitting: Orthopedic Surgery

## 2023-12-04 ENCOUNTER — Other Ambulatory Visit (INDEPENDENT_AMBULATORY_CARE_PROVIDER_SITE_OTHER): Payer: Self-pay

## 2023-12-04 VITALS — BP 101/55 | Ht 64.0 in | Wt 132.0 lb

## 2023-12-04 DIAGNOSIS — S52022A Displaced fracture of olecranon process without intraarticular extension of left ulna, initial encounter for closed fracture: Secondary | ICD-10-CM

## 2023-12-04 NOTE — Progress Notes (Signed)
   Chief Complaint  Patient presents with   Elbow Injury    Left 11/25/23     Emergency room follow-up  89 year old female status post fall injured left elbow sustained an olecranon fracture which was placed in a splint.  She is subsequently remove the splint.  She presents with a large bruise over the left elbow but has active flexion and extension without pain  She is confused and is here with 2 family members  Review of systems could not be obtained  Examination was as follows her development looked normal nutritional status looked acceptable body habitus was normal grooming was okay  She had good pulses and temperature in her left upper extremity with no obvious palpable lymph nodes  She was in a wheelchair and the family member says she does not walk  She had a large bruise over the inside part of the elbow and the skin there were no open wounds she had active range of motion of the left elbow no instability some tenderness over the fracture site normal muscle tone and again the skin was normal but bruised she was not oriented to time and place but was to person mood was noted to have no depression anxiety or agitation  The outside images show a displaced or separated liquid on fracture with some comminution displacement 5 to 7 mm  I repeated the x-ray  DG Elbow 2 Views Left Result Date: 12/04/2023 X-rays left elbow follow-up on a left olecranon fracture Compare x-ray dated Nov 25, 2023 X-ray initially showed Comminuted fracture of the olecranon with minimal separation of the fracture fragments Today's x-ray shows similar separation of the fracture fragments intact radial ulnar joint normal radial head position Impression minimally displaced fracture of the olecranon    After discussing with the family members I recommended nonoperative treatment no cast I do not think she will tolerate that either  They agreed  Will see her on an as-needed basis

## 2023-12-04 NOTE — Progress Notes (Signed)
  Intake history:  BP (!) 101/55 Comment: 11/25/23  Ht 5\' 4"  (1.626 m)   Wt 132 lb (59.9 kg)   BMI 22.66 kg/m  Body mass index is 22.66 kg/m.    WHAT ARE WE SEEING YOU FOR TODAY?   left elbow  How long has this bothered you? (DOI?DOS?WS?)  on 11/25/23  Anticoag.  No  Diabetes No  Heart disease No  Hypertension No  SMOKING HX No /former   Kidney disease No  Any ALLERGIES ______________________________________________   Treatment:  Have you taken:  Tylenol  Yes  Advil  No  Had PT No  Had injection No  Other  _______________had splint but she took it off __________

## 2023-12-09 DIAGNOSIS — J189 Pneumonia, unspecified organism: Secondary | ICD-10-CM | POA: Diagnosis not present

## 2023-12-09 DIAGNOSIS — F419 Anxiety disorder, unspecified: Secondary | ICD-10-CM | POA: Diagnosis not present

## 2023-12-09 DIAGNOSIS — J45909 Unspecified asthma, uncomplicated: Secondary | ICD-10-CM | POA: Diagnosis not present

## 2023-12-09 DIAGNOSIS — E559 Vitamin D deficiency, unspecified: Secondary | ICD-10-CM | POA: Diagnosis not present

## 2023-12-09 DIAGNOSIS — D649 Anemia, unspecified: Secondary | ICD-10-CM | POA: Diagnosis not present

## 2023-12-09 DIAGNOSIS — E785 Hyperlipidemia, unspecified: Secondary | ICD-10-CM | POA: Diagnosis not present

## 2023-12-09 DIAGNOSIS — F0390 Unspecified dementia without behavioral disturbance: Secondary | ICD-10-CM | POA: Diagnosis not present

## 2023-12-09 DIAGNOSIS — R4189 Other symptoms and signs involving cognitive functions and awareness: Secondary | ICD-10-CM | POA: Diagnosis not present

## 2023-12-09 DIAGNOSIS — R41841 Cognitive communication deficit: Secondary | ICD-10-CM | POA: Diagnosis not present

## 2023-12-30 DIAGNOSIS — S32000D Wedge compression fracture of unspecified lumbar vertebra, subsequent encounter for fracture with routine healing: Secondary | ICD-10-CM | POA: Diagnosis not present

## 2023-12-30 DIAGNOSIS — E559 Vitamin D deficiency, unspecified: Secondary | ICD-10-CM | POA: Diagnosis not present

## 2023-12-30 DIAGNOSIS — F419 Anxiety disorder, unspecified: Secondary | ICD-10-CM | POA: Diagnosis not present

## 2023-12-30 DIAGNOSIS — E785 Hyperlipidemia, unspecified: Secondary | ICD-10-CM | POA: Diagnosis not present

## 2023-12-30 DIAGNOSIS — I10 Essential (primary) hypertension: Secondary | ICD-10-CM | POA: Diagnosis not present

## 2023-12-30 DIAGNOSIS — R41841 Cognitive communication deficit: Secondary | ICD-10-CM | POA: Diagnosis not present

## 2023-12-30 DIAGNOSIS — J45909 Unspecified asthma, uncomplicated: Secondary | ICD-10-CM | POA: Diagnosis not present

## 2023-12-30 DIAGNOSIS — D649 Anemia, unspecified: Secondary | ICD-10-CM | POA: Diagnosis not present

## 2023-12-30 DIAGNOSIS — F0390 Unspecified dementia without behavioral disturbance: Secondary | ICD-10-CM | POA: Diagnosis not present

## 2024-01-01 DIAGNOSIS — M2042 Other hammer toe(s) (acquired), left foot: Secondary | ICD-10-CM | POA: Diagnosis not present

## 2024-01-01 DIAGNOSIS — M2041 Other hammer toe(s) (acquired), right foot: Secondary | ICD-10-CM | POA: Diagnosis not present

## 2024-01-09 DIAGNOSIS — R059 Cough, unspecified: Secondary | ICD-10-CM | POA: Diagnosis not present

## 2024-01-09 DIAGNOSIS — R0989 Other specified symptoms and signs involving the circulatory and respiratory systems: Secondary | ICD-10-CM | POA: Diagnosis not present

## 2024-01-13 DIAGNOSIS — R0989 Other specified symptoms and signs involving the circulatory and respiratory systems: Secondary | ICD-10-CM | POA: Diagnosis not present

## 2024-01-13 DIAGNOSIS — R059 Cough, unspecified: Secondary | ICD-10-CM | POA: Diagnosis not present

## 2024-01-13 DIAGNOSIS — N390 Urinary tract infection, site not specified: Secondary | ICD-10-CM | POA: Diagnosis not present

## 2024-01-13 DIAGNOSIS — D649 Anemia, unspecified: Secondary | ICD-10-CM | POA: Diagnosis not present

## 2024-01-13 DIAGNOSIS — Z79899 Other long term (current) drug therapy: Secondary | ICD-10-CM | POA: Diagnosis not present

## 2024-01-14 DIAGNOSIS — J189 Pneumonia, unspecified organism: Secondary | ICD-10-CM | POA: Diagnosis not present

## 2024-01-14 DIAGNOSIS — Z79899 Other long term (current) drug therapy: Secondary | ICD-10-CM | POA: Diagnosis not present

## 2024-01-14 DIAGNOSIS — R4182 Altered mental status, unspecified: Secondary | ICD-10-CM | POA: Diagnosis not present

## 2024-01-15 DIAGNOSIS — F411 Generalized anxiety disorder: Secondary | ICD-10-CM | POA: Diagnosis not present

## 2024-01-15 DIAGNOSIS — R451 Restlessness and agitation: Secondary | ICD-10-CM | POA: Diagnosis not present

## 2024-01-15 DIAGNOSIS — F01C18 Vascular dementia, severe, with other behavioral disturbance: Secondary | ICD-10-CM | POA: Diagnosis not present

## 2024-01-19 DIAGNOSIS — Z79899 Other long term (current) drug therapy: Secondary | ICD-10-CM | POA: Diagnosis not present

## 2024-01-19 DIAGNOSIS — J189 Pneumonia, unspecified organism: Secondary | ICD-10-CM | POA: Diagnosis not present

## 2024-01-19 DIAGNOSIS — R6889 Other general symptoms and signs: Secondary | ICD-10-CM | POA: Diagnosis not present

## 2024-01-20 DIAGNOSIS — F01C18 Vascular dementia, severe, with other behavioral disturbance: Secondary | ICD-10-CM | POA: Diagnosis not present

## 2024-01-20 DIAGNOSIS — J45909 Unspecified asthma, uncomplicated: Secondary | ICD-10-CM | POA: Diagnosis not present

## 2024-01-20 DIAGNOSIS — I10 Essential (primary) hypertension: Secondary | ICD-10-CM | POA: Diagnosis not present

## 2024-01-20 DIAGNOSIS — K219 Gastro-esophageal reflux disease without esophagitis: Secondary | ICD-10-CM | POA: Diagnosis not present

## 2024-01-20 DIAGNOSIS — R6889 Other general symptoms and signs: Secondary | ICD-10-CM | POA: Diagnosis not present

## 2024-01-20 DIAGNOSIS — E785 Hyperlipidemia, unspecified: Secondary | ICD-10-CM | POA: Diagnosis not present

## 2024-01-20 DIAGNOSIS — R451 Restlessness and agitation: Secondary | ICD-10-CM | POA: Diagnosis not present

## 2024-01-20 DIAGNOSIS — S32000D Wedge compression fracture of unspecified lumbar vertebra, subsequent encounter for fracture with routine healing: Secondary | ICD-10-CM | POA: Diagnosis not present

## 2024-01-20 DIAGNOSIS — F0390 Unspecified dementia without behavioral disturbance: Secondary | ICD-10-CM | POA: Diagnosis not present

## 2024-01-20 DIAGNOSIS — J189 Pneumonia, unspecified organism: Secondary | ICD-10-CM | POA: Diagnosis not present

## 2024-01-20 DIAGNOSIS — E559 Vitamin D deficiency, unspecified: Secondary | ICD-10-CM | POA: Diagnosis not present

## 2024-01-20 DIAGNOSIS — F411 Generalized anxiety disorder: Secondary | ICD-10-CM | POA: Diagnosis not present

## 2024-01-21 DIAGNOSIS — F01C18 Vascular dementia, severe, with other behavioral disturbance: Secondary | ICD-10-CM | POA: Diagnosis not present

## 2024-01-21 DIAGNOSIS — R1312 Dysphagia, oropharyngeal phase: Secondary | ICD-10-CM | POA: Diagnosis not present

## 2024-01-25 DIAGNOSIS — F01C18 Vascular dementia, severe, with other behavioral disturbance: Secondary | ICD-10-CM | POA: Diagnosis not present

## 2024-01-25 DIAGNOSIS — R1312 Dysphagia, oropharyngeal phase: Secondary | ICD-10-CM | POA: Diagnosis not present

## 2024-01-27 DIAGNOSIS — R1312 Dysphagia, oropharyngeal phase: Secondary | ICD-10-CM | POA: Diagnosis not present

## 2024-01-27 DIAGNOSIS — F01C18 Vascular dementia, severe, with other behavioral disturbance: Secondary | ICD-10-CM | POA: Diagnosis not present

## 2024-01-29 DIAGNOSIS — R1312 Dysphagia, oropharyngeal phase: Secondary | ICD-10-CM | POA: Diagnosis not present

## 2024-01-29 DIAGNOSIS — F01C18 Vascular dementia, severe, with other behavioral disturbance: Secondary | ICD-10-CM | POA: Diagnosis not present

## 2024-01-29 DIAGNOSIS — F411 Generalized anxiety disorder: Secondary | ICD-10-CM | POA: Diagnosis not present

## 2024-01-29 DIAGNOSIS — R451 Restlessness and agitation: Secondary | ICD-10-CM | POA: Diagnosis not present

## 2024-02-01 DIAGNOSIS — F01C18 Vascular dementia, severe, with other behavioral disturbance: Secondary | ICD-10-CM | POA: Diagnosis not present

## 2024-02-01 DIAGNOSIS — R1312 Dysphagia, oropharyngeal phase: Secondary | ICD-10-CM | POA: Diagnosis not present

## 2024-02-02 DIAGNOSIS — R4189 Other symptoms and signs involving cognitive functions and awareness: Secondary | ICD-10-CM | POA: Diagnosis not present

## 2024-02-02 DIAGNOSIS — F0390 Unspecified dementia without behavioral disturbance: Secondary | ICD-10-CM | POA: Diagnosis not present

## 2024-02-02 DIAGNOSIS — F32A Depression, unspecified: Secondary | ICD-10-CM | POA: Diagnosis not present

## 2024-02-02 DIAGNOSIS — Z79899 Other long term (current) drug therapy: Secondary | ICD-10-CM | POA: Diagnosis not present

## 2024-02-02 DIAGNOSIS — I10 Essential (primary) hypertension: Secondary | ICD-10-CM | POA: Diagnosis not present

## 2024-02-03 DIAGNOSIS — F01C18 Vascular dementia, severe, with other behavioral disturbance: Secondary | ICD-10-CM | POA: Diagnosis not present

## 2024-02-03 DIAGNOSIS — R1312 Dysphagia, oropharyngeal phase: Secondary | ICD-10-CM | POA: Diagnosis not present

## 2024-02-04 DIAGNOSIS — J302 Other seasonal allergic rhinitis: Secondary | ICD-10-CM | POA: Diagnosis not present

## 2024-02-04 DIAGNOSIS — Z79899 Other long term (current) drug therapy: Secondary | ICD-10-CM | POA: Diagnosis not present

## 2024-02-05 DIAGNOSIS — R1312 Dysphagia, oropharyngeal phase: Secondary | ICD-10-CM | POA: Diagnosis not present

## 2024-02-05 DIAGNOSIS — F01C18 Vascular dementia, severe, with other behavioral disturbance: Secondary | ICD-10-CM | POA: Diagnosis not present

## 2024-02-08 DIAGNOSIS — R1312 Dysphagia, oropharyngeal phase: Secondary | ICD-10-CM | POA: Diagnosis not present

## 2024-02-08 DIAGNOSIS — F01C18 Vascular dementia, severe, with other behavioral disturbance: Secondary | ICD-10-CM | POA: Diagnosis not present

## 2024-02-09 DIAGNOSIS — Z961 Presence of intraocular lens: Secondary | ICD-10-CM | POA: Diagnosis not present

## 2024-02-09 DIAGNOSIS — H43813 Vitreous degeneration, bilateral: Secondary | ICD-10-CM | POA: Diagnosis not present

## 2024-02-10 DIAGNOSIS — R1312 Dysphagia, oropharyngeal phase: Secondary | ICD-10-CM | POA: Diagnosis not present

## 2024-02-10 DIAGNOSIS — F01C18 Vascular dementia, severe, with other behavioral disturbance: Secondary | ICD-10-CM | POA: Diagnosis not present

## 2024-02-12 DIAGNOSIS — R2689 Other abnormalities of gait and mobility: Secondary | ICD-10-CM | POA: Diagnosis not present

## 2024-02-12 DIAGNOSIS — M6281 Muscle weakness (generalized): Secondary | ICD-10-CM | POA: Diagnosis not present

## 2024-02-12 DIAGNOSIS — R269 Unspecified abnormalities of gait and mobility: Secondary | ICD-10-CM | POA: Diagnosis not present

## 2024-02-12 DIAGNOSIS — F802 Mixed receptive-expressive language disorder: Secondary | ICD-10-CM | POA: Diagnosis not present

## 2024-02-12 DIAGNOSIS — F01C18 Vascular dementia, severe, with other behavioral disturbance: Secondary | ICD-10-CM | POA: Diagnosis not present

## 2024-02-12 DIAGNOSIS — R296 Repeated falls: Secondary | ICD-10-CM | POA: Diagnosis not present

## 2024-02-12 DIAGNOSIS — R2681 Unsteadiness on feet: Secondary | ICD-10-CM | POA: Diagnosis not present

## 2024-02-12 DIAGNOSIS — R1312 Dysphagia, oropharyngeal phase: Secondary | ICD-10-CM | POA: Diagnosis not present

## 2024-02-15 DIAGNOSIS — R1312 Dysphagia, oropharyngeal phase: Secondary | ICD-10-CM | POA: Diagnosis not present

## 2024-02-15 DIAGNOSIS — F802 Mixed receptive-expressive language disorder: Secondary | ICD-10-CM | POA: Diagnosis not present

## 2024-02-15 DIAGNOSIS — R269 Unspecified abnormalities of gait and mobility: Secondary | ICD-10-CM | POA: Diagnosis not present

## 2024-02-15 DIAGNOSIS — R2689 Other abnormalities of gait and mobility: Secondary | ICD-10-CM | POA: Diagnosis not present

## 2024-02-15 DIAGNOSIS — R296 Repeated falls: Secondary | ICD-10-CM | POA: Diagnosis not present

## 2024-02-15 DIAGNOSIS — M6281 Muscle weakness (generalized): Secondary | ICD-10-CM | POA: Diagnosis not present

## 2024-02-15 DIAGNOSIS — R2681 Unsteadiness on feet: Secondary | ICD-10-CM | POA: Diagnosis not present

## 2024-02-15 DIAGNOSIS — F01C18 Vascular dementia, severe, with other behavioral disturbance: Secondary | ICD-10-CM | POA: Diagnosis not present

## 2024-02-16 DIAGNOSIS — R296 Repeated falls: Secondary | ICD-10-CM | POA: Diagnosis not present

## 2024-02-16 DIAGNOSIS — F01C18 Vascular dementia, severe, with other behavioral disturbance: Secondary | ICD-10-CM | POA: Diagnosis not present

## 2024-02-16 DIAGNOSIS — F802 Mixed receptive-expressive language disorder: Secondary | ICD-10-CM | POA: Diagnosis not present

## 2024-02-16 DIAGNOSIS — M6281 Muscle weakness (generalized): Secondary | ICD-10-CM | POA: Diagnosis not present

## 2024-02-16 DIAGNOSIS — R1312 Dysphagia, oropharyngeal phase: Secondary | ICD-10-CM | POA: Diagnosis not present

## 2024-02-16 DIAGNOSIS — R2681 Unsteadiness on feet: Secondary | ICD-10-CM | POA: Diagnosis not present

## 2024-02-16 DIAGNOSIS — R2689 Other abnormalities of gait and mobility: Secondary | ICD-10-CM | POA: Diagnosis not present

## 2024-02-16 DIAGNOSIS — R269 Unspecified abnormalities of gait and mobility: Secondary | ICD-10-CM | POA: Diagnosis not present

## 2024-02-19 DIAGNOSIS — R296 Repeated falls: Secondary | ICD-10-CM | POA: Diagnosis not present

## 2024-02-19 DIAGNOSIS — M6281 Muscle weakness (generalized): Secondary | ICD-10-CM | POA: Diagnosis not present

## 2024-02-19 DIAGNOSIS — R2689 Other abnormalities of gait and mobility: Secondary | ICD-10-CM | POA: Diagnosis not present

## 2024-02-19 DIAGNOSIS — F01C18 Vascular dementia, severe, with other behavioral disturbance: Secondary | ICD-10-CM | POA: Diagnosis not present

## 2024-02-19 DIAGNOSIS — R269 Unspecified abnormalities of gait and mobility: Secondary | ICD-10-CM | POA: Diagnosis not present

## 2024-02-19 DIAGNOSIS — R2681 Unsteadiness on feet: Secondary | ICD-10-CM | POA: Diagnosis not present

## 2024-02-19 DIAGNOSIS — R1312 Dysphagia, oropharyngeal phase: Secondary | ICD-10-CM | POA: Diagnosis not present

## 2024-02-19 DIAGNOSIS — F802 Mixed receptive-expressive language disorder: Secondary | ICD-10-CM | POA: Diagnosis not present

## 2024-02-21 DIAGNOSIS — R269 Unspecified abnormalities of gait and mobility: Secondary | ICD-10-CM | POA: Diagnosis not present

## 2024-02-21 DIAGNOSIS — R296 Repeated falls: Secondary | ICD-10-CM | POA: Diagnosis not present

## 2024-02-21 DIAGNOSIS — F01C18 Vascular dementia, severe, with other behavioral disturbance: Secondary | ICD-10-CM | POA: Diagnosis not present

## 2024-02-21 DIAGNOSIS — R2689 Other abnormalities of gait and mobility: Secondary | ICD-10-CM | POA: Diagnosis not present

## 2024-02-21 DIAGNOSIS — M6281 Muscle weakness (generalized): Secondary | ICD-10-CM | POA: Diagnosis not present

## 2024-02-21 DIAGNOSIS — F802 Mixed receptive-expressive language disorder: Secondary | ICD-10-CM | POA: Diagnosis not present

## 2024-02-21 DIAGNOSIS — R1312 Dysphagia, oropharyngeal phase: Secondary | ICD-10-CM | POA: Diagnosis not present

## 2024-02-21 DIAGNOSIS — R2681 Unsteadiness on feet: Secondary | ICD-10-CM | POA: Diagnosis not present

## 2024-02-23 DIAGNOSIS — R2681 Unsteadiness on feet: Secondary | ICD-10-CM | POA: Diagnosis not present

## 2024-02-23 DIAGNOSIS — R269 Unspecified abnormalities of gait and mobility: Secondary | ICD-10-CM | POA: Diagnosis not present

## 2024-02-23 DIAGNOSIS — F01C18 Vascular dementia, severe, with other behavioral disturbance: Secondary | ICD-10-CM | POA: Diagnosis not present

## 2024-02-23 DIAGNOSIS — R1312 Dysphagia, oropharyngeal phase: Secondary | ICD-10-CM | POA: Diagnosis not present

## 2024-02-23 DIAGNOSIS — M6281 Muscle weakness (generalized): Secondary | ICD-10-CM | POA: Diagnosis not present

## 2024-02-23 DIAGNOSIS — F802 Mixed receptive-expressive language disorder: Secondary | ICD-10-CM | POA: Diagnosis not present

## 2024-02-23 DIAGNOSIS — R296 Repeated falls: Secondary | ICD-10-CM | POA: Diagnosis not present

## 2024-02-23 DIAGNOSIS — R2689 Other abnormalities of gait and mobility: Secondary | ICD-10-CM | POA: Diagnosis not present

## 2024-02-24 DIAGNOSIS — R2681 Unsteadiness on feet: Secondary | ICD-10-CM | POA: Diagnosis not present

## 2024-02-24 DIAGNOSIS — R296 Repeated falls: Secondary | ICD-10-CM | POA: Diagnosis not present

## 2024-02-24 DIAGNOSIS — F802 Mixed receptive-expressive language disorder: Secondary | ICD-10-CM | POA: Diagnosis not present

## 2024-02-24 DIAGNOSIS — M6281 Muscle weakness (generalized): Secondary | ICD-10-CM | POA: Diagnosis not present

## 2024-02-24 DIAGNOSIS — F01C18 Vascular dementia, severe, with other behavioral disturbance: Secondary | ICD-10-CM | POA: Diagnosis not present

## 2024-02-24 DIAGNOSIS — R1312 Dysphagia, oropharyngeal phase: Secondary | ICD-10-CM | POA: Diagnosis not present

## 2024-02-24 DIAGNOSIS — R2689 Other abnormalities of gait and mobility: Secondary | ICD-10-CM | POA: Diagnosis not present

## 2024-02-24 DIAGNOSIS — R269 Unspecified abnormalities of gait and mobility: Secondary | ICD-10-CM | POA: Diagnosis not present

## 2024-02-28 DIAGNOSIS — F01C18 Vascular dementia, severe, with other behavioral disturbance: Secondary | ICD-10-CM | POA: Diagnosis not present

## 2024-02-28 DIAGNOSIS — R269 Unspecified abnormalities of gait and mobility: Secondary | ICD-10-CM | POA: Diagnosis not present

## 2024-02-28 DIAGNOSIS — M6281 Muscle weakness (generalized): Secondary | ICD-10-CM | POA: Diagnosis not present

## 2024-02-28 DIAGNOSIS — R2689 Other abnormalities of gait and mobility: Secondary | ICD-10-CM | POA: Diagnosis not present

## 2024-02-28 DIAGNOSIS — R1312 Dysphagia, oropharyngeal phase: Secondary | ICD-10-CM | POA: Diagnosis not present

## 2024-02-28 DIAGNOSIS — R296 Repeated falls: Secondary | ICD-10-CM | POA: Diagnosis not present

## 2024-02-28 DIAGNOSIS — R2681 Unsteadiness on feet: Secondary | ICD-10-CM | POA: Diagnosis not present

## 2024-02-28 DIAGNOSIS — F802 Mixed receptive-expressive language disorder: Secondary | ICD-10-CM | POA: Diagnosis not present

## 2024-02-29 ENCOUNTER — Encounter (HOSPITAL_COMMUNITY): Payer: Self-pay | Admitting: Emergency Medicine

## 2024-02-29 ENCOUNTER — Emergency Department (HOSPITAL_COMMUNITY)
Admission: EM | Admit: 2024-02-29 | Discharge: 2024-02-29 | Disposition: A | Discharge status: Home / Self Care | Source: Skilled Nursing Facility | Attending: Emergency Medicine | Admitting: Emergency Medicine

## 2024-02-29 ENCOUNTER — Emergency Department (HOSPITAL_COMMUNITY)

## 2024-02-29 ENCOUNTER — Other Ambulatory Visit: Payer: Self-pay

## 2024-02-29 DIAGNOSIS — W19XXXA Unspecified fall, initial encounter: Secondary | ICD-10-CM

## 2024-02-29 DIAGNOSIS — S0101XA Laceration without foreign body of scalp, initial encounter: Secondary | ICD-10-CM | POA: Diagnosis not present

## 2024-02-29 DIAGNOSIS — S0990XA Unspecified injury of head, initial encounter: Secondary | ICD-10-CM | POA: Diagnosis not present

## 2024-02-29 DIAGNOSIS — Y92129 Unspecified place in nursing home as the place of occurrence of the external cause: Secondary | ICD-10-CM | POA: Diagnosis not present

## 2024-02-29 DIAGNOSIS — Z23 Encounter for immunization: Secondary | ICD-10-CM | POA: Insufficient documentation

## 2024-02-29 DIAGNOSIS — R58 Hemorrhage, not elsewhere classified: Secondary | ICD-10-CM | POA: Diagnosis not present

## 2024-02-29 DIAGNOSIS — S199XXA Unspecified injury of neck, initial encounter: Secondary | ICD-10-CM | POA: Diagnosis not present

## 2024-02-29 DIAGNOSIS — F0390 Unspecified dementia without behavioral disturbance: Secondary | ICD-10-CM | POA: Diagnosis not present

## 2024-02-29 DIAGNOSIS — F29 Unspecified psychosis not due to a substance or known physiological condition: Secondary | ICD-10-CM | POA: Diagnosis not present

## 2024-02-29 DIAGNOSIS — R531 Weakness: Secondary | ICD-10-CM | POA: Diagnosis not present

## 2024-02-29 DIAGNOSIS — S0003XA Contusion of scalp, initial encounter: Secondary | ICD-10-CM | POA: Diagnosis not present

## 2024-02-29 DIAGNOSIS — W01198A Fall on same level from slipping, tripping and stumbling with subsequent striking against other object, initial encounter: Secondary | ICD-10-CM | POA: Diagnosis not present

## 2024-02-29 DIAGNOSIS — G319 Degenerative disease of nervous system, unspecified: Secondary | ICD-10-CM | POA: Diagnosis not present

## 2024-02-29 MED ORDER — ACETAMINOPHEN 160 MG/5ML PO SOLN
650.0000 mg | Freq: Once | ORAL | Status: AC
  Administered 2024-02-29: 650 mg via ORAL
  Filled 2024-02-29: qty 20.3

## 2024-02-29 MED ORDER — TETANUS-DIPHTH-ACELL PERTUSSIS 5-2.5-18.5 LF-MCG/0.5 IM SUSY
0.5000 mL | PREFILLED_SYRINGE | Freq: Once | INTRAMUSCULAR | Status: AC
  Administered 2024-02-29: 0.5 mL via INTRAMUSCULAR
  Filled 2024-02-29: qty 0.5

## 2024-02-29 NOTE — ED Notes (Addendum)
 Called [facility] to give report to [employer]. Patient currently waiting in [facility] with daughter and SIL.

## 2024-02-29 NOTE — Discharge Instructions (Signed)
 You were seen for your fall in the emergency department.   At home, please keep your scalp wound dry for 48 hours since it has tissue adhesive.    Check your MyChart online for the results of any tests that had not resulted by the time you left the emergency department.   Follow-up with your primary doctor in 2-3 days regarding your visit.    Return immediately to the emergency department if you experience any of the following: severe pain, vomiting, or any other concerning symptoms.    Thank you for visiting our Emergency Department. It was a pleasure taking care of you today.

## 2024-02-29 NOTE — ED Provider Notes (Signed)
 [place] EMERGENCY DEPARTMENT AT [facility] Provider Note   CSN: [id number] Arrival date & time: 02/29/24  1350     Patient presents with: Mikayla Wilson is a 89 y.o. female.   89 year old female who presents emergency department for fall.  Reportedly had a mechanical fall at her nursing home.  Hit her head prolonged downtime.  Did have small amount of bleeding.  Not on blood thinners. Denies any pain to me.        Prior to Admission medications   Medication Sig Start Date End Date Taking? Authorizing Provider  acetaminophen  (TYLENOL ) 500 MG tablet Take 2 tablets (1,000 mg total) by mouth every 8 (eight) hours. 09/08/23   Evonnie Lenis, MD  acetaminophen  (TYLENOL ) 500 MG tablet Take 1 tablet (500 mg total) by mouth every 6 (six) hours as needed. 11/26/23   Horton, Charmaine FALCON, MD  amoxicillin -clavulanate (AUGMENTIN ) 875-125 MG tablet Take 1 tablet by mouth every 12 (twelve) hours. 11/26/23   Horton, Charmaine FALCON, MD  cefadroxil  (DURICEF) 500 MG capsule Take 1 capsule (500 mg total) by mouth 2 (two) times daily. X 2 doses Patient not taking: Reported on 12/04/2023 09/08/23   Evonnie Lenis, MD  cyanocobalamin  (VITAMIN B12) 500 MCG tablet Take 1 tablet (500 mcg total) by mouth daily. 09/09/23   Evonnie Lenis, MD  escitalopram  (LEXAPRO ) 5 MG tablet Take 1 tablet (5 mg total) by mouth daily. 06/10/23   Dettinger, Fonda LABOR, MD  famotidine  (PEPCID ) 20 MG tablet Take 1 tablet (20 mg total) by mouth 2 (two) times daily. 06/10/23   Dettinger, Fonda LABOR, MD  feeding supplement (ENSURE ENLIVE / ENSURE PLUS) LIQD Take 237 mLs by mouth 2 (two) times daily between meals. 09/08/23   Evonnie Lenis, MD  ketotifen  (ZADITOR ) 0.025 % ophthalmic solution Place 1 drop into both eyes 6 (six) times daily.    [provider]  loratadine  (EQ LORATADINE ) 10 MG tablet Take 1 tablet (10 mg total) by mouth daily. TAKE 1 TABLET BY MOUTH ONCE DAILY AS NEEDED FOR ALLERGIES 06/10/23   Dettinger, Fonda LABOR, MD   LORazepam  (ATIVAN ) 1 MG tablet Take 1 tablet (1 mg total) by mouth every morning. 09/09/23   Evonnie Lenis, MD  omeprazole  (PRILOSEC) 20 MG capsule Take 1 capsule (20 mg total) by mouth daily. 06/10/23   Dettinger, Fonda LABOR, MD  potassium chloride  (KLOR-CON  M) 10 MEQ tablet Take 1 tablet (10 mEq total) by mouth daily. 06/10/23   Dettinger, Fonda LABOR, MD  rosuvastatin  (CRESTOR ) 5 MG tablet Take 1 tablet (5 mg total) by mouth daily. 06/10/23   Dettinger, Fonda LABOR, MD    Allergies: Aspirin and Morphine and codeine    Review of Systems  Updated Vital Signs BP 125/64 (BP Location: Right Arm)   Pulse 92   Temp 98.2 F (36.8 C) (Oral)   Resp 20   Ht 5' 4 (1.626 m)   Wt 59.9 kg   SpO2 95%   BMI 22.67 kg/m   Physical Exam Constitutional:      General: She is not in acute distress.    Appearance: Normal appearance. She is not ill-appearing.  HENT:     Head: Normocephalic.     Comments: Scalp hematoma. Bleeding controlled. No laceration noted.     Right Ear: External ear normal.     Left Ear: External ear normal.     Mouth/Throat:     Mouth: Mucous membranes are moist.     Pharynx: Oropharynx  is clear.  Eyes:     Extraocular Movements: Extraocular movements intact.     Conjunctiva/sclera: Conjunctivae normal.     Pupils: Pupils are equal, round, and reactive to light.     Comments: 4mm BL  Neck:     Comments: No C-spine midline tenderness to palpation Cardiovascular:     Rate and Rhythm: Normal rate and regular rhythm.     Pulses: Normal pulses.     Heart sounds: Normal heart sounds.  Pulmonary:     Effort: Pulmonary effort is normal. No respiratory distress.     Breath sounds: Normal breath sounds.  Abdominal:     General: Abdomen is flat. There is no distension.     Palpations: Abdomen is soft. There is no mass.     Tenderness: There is no abdominal tenderness. There is no guarding.  Musculoskeletal:        General: No deformity. Normal range of motion.     Cervical back:  No rigidity or tenderness.     Comments: No tenderness to palpation of midline thoracic or lumbar spine.  No step-offs palpated.  No tenderness to palpation of chest wall.  No bruising noted.  No tenderness to palpation of bilateral clavicles.  No tenderness to palpation, bruising, or deformities noted of bilateral shoulders, elbows, wrists, hips, knees, or ankles.  Neurological:     General: No focal deficit present.     Mental Status: She is alert. Mental status is at baseline.     Cranial Nerves: No cranial nerve deficit.     Sensory: No sensory deficit.     Motor: No weakness.     (all labs ordered are listed, but only abnormal results are displayed) Labs Reviewed - No data to display  EKG: None  Radiology: CT Head Wo Contrast Result Date: 02/29/2024 CLINICAL DATA:  Head trauma, minor (Age >= 65y); Neck trauma (Age >= 65y) EXAM: CT HEAD WITHOUT CONTRAST CT CERVICAL SPINE WITHOUT CONTRAST TECHNIQUE: Multidetector CT imaging of the head and cervical spine was performed following the standard protocol without intravenous contrast. Multiplanar CT image reconstructions of the cervical spine were also generated. RADIATION DOSE REDUCTION: This exam was performed according to the departmental dose-optimization program which includes automated exposure control, adjustment of the mA and/or kV according to patient size and/or use of iterative reconstruction technique. COMPARISON:  None Available. FINDINGS: CT HEAD FINDINGS Brain: No evidence of acute infarction, hemorrhage, hydrocephalus, extra-axial collection or mass lesion/mass effect. Cerebral atrophy. Vascular: No hyperdense vessel Skull: No evidence of acute fracture. High right posterior scalp contusion. Sinuses/Orbits: No acute finding. CT CERVICAL SPINE FINDINGS Alignment: Normal. Skull base and vertebrae: No evidence of acute fracture. Soft tissues and spinal canal: No prevertebral fluid or swelling. No visible canal hematoma. Disc levels:   Mild bony degenerative change. Upper chest: Lung apices are clear. IMPRESSION: 1. No acute intracranial abnormality. 2. No evidence of acute fracture or traumatic malalignment in the cervical spine. Electronically Signed   By: Gilmore GORMAN Molt M.D.   On: 02/29/2024 15:20   CT Cervical Spine Wo Contrast Result Date: 02/29/2024 CLINICAL DATA:  Head trauma, minor (Age >= 65y); Neck trauma (Age >= 65y) EXAM: CT HEAD WITHOUT CONTRAST CT CERVICAL SPINE WITHOUT CONTRAST TECHNIQUE: Multidetector CT imaging of the head and cervical spine was performed following the standard protocol without intravenous contrast. Multiplanar CT image reconstructions of the cervical spine were also generated. RADIATION DOSE REDUCTION: This exam was performed according to the departmental dose-optimization program which includes automated  exposure control, adjustment of the mA and/or kV according to patient size and/or use of iterative reconstruction technique. COMPARISON:  None Available. FINDINGS: CT HEAD FINDINGS Brain: No evidence of acute infarction, hemorrhage, hydrocephalus, extra-axial collection or mass lesion/mass effect. Cerebral atrophy. Vascular: No hyperdense vessel Skull: No evidence of acute fracture. High right posterior scalp contusion. Sinuses/Orbits: No acute finding. CT CERVICAL SPINE FINDINGS Alignment: Normal. Skull base and vertebrae: No evidence of acute fracture. Soft tissues and spinal canal: No prevertebral fluid or swelling. No visible canal hematoma. Disc levels:  Mild bony degenerative change. Upper chest: Lung apices are clear. IMPRESSION: 1. No acute intracranial abnormality. 2. No evidence of acute fracture or traumatic malalignment in the cervical spine. Electronically Signed   By: Gilmore GORMAN Molt M.D.   On: 02/29/2024 15:20     .Laceration Repair  Date/Time: 02/29/2024 4:09 PM  Performed by: Yolande Lamar BROCKS, MD Authorized by: Yolande Lamar BROCKS, MD   Consent:    Consent obtained:  Verbal    Consent given by:  Patient and guardian Anesthesia:    Anesthesia method:  None Laceration details:    Location:  Scalp   Scalp location:  Occipital   Length (cm):  0.2 Skin repair:    Repair method:  Tissue adhesive Approximation:    Approximation:  Close Repair type:    Repair type:  Simple Post-procedure details:    Dressing:  Open (no dressing) Comments:     Very small laceration repaired with hair apposition    Medications Ordered in the ED  Tdap (BOOSTRIX ) injection 0.5 mL (0.5 mLs Intramuscular Given 02/29/24 1447)  acetaminophen  (TYLENOL ) 160 MG/5ML solution 650 mg (650 mg Oral Given 02/29/24 1654)                                    Medical Decision Making Amount and/or Complexity of Data Reviewed Radiology: ordered.  Risk OTC drugs. Prescription drug management.   89 year old female presented to the emergency department with mechanical fall  Initial Ddx:  TBI, C-spine injury, hip fracture  MDM/Course:  Patient presents emergency department after mechanical fall.  Does have very small punctate laceration on her occiput.  Had a head CT and C-spine CTs did not show acute abnormality.  No other signs of trauma on exam.  Was able to control the bleeding with direct pressure and then performed hair apposition with Dermabond to close the wound.  Family at the bedside and has no other concerns at this time.  Will discharge her back to her facility.  This patient presents to the ED for concern of complaints listed in HPI, this involves an extensive number of treatment options, and is a complaint that carries with it a high risk of complications and morbidity. Disposition including potential need for admission considered.   Dispo: DC Home. Return precautions discussed including, but not limited to, those listed in the AVS. Allowed pt time to ask questions which were answered fully prior to dc.  Additional history obtained from daughter Records reviewed Outpatient Clinic  Notes I independently reviewed the following imaging with scope of interpretation limited to determining acute life threatening conditions related to emergency care: CT Head and agree with the radiologist interpretation with the following exceptions: none I have reviewed the patients home medications and made adjustments as needed Social Determinants of health:  Geriatric  Portions of this note were generated with Scientist, clinical (histocompatibility and immunogenetics). Dictation errors may  occur despite best attempts at proofreading.     Final diagnoses:  Fall, initial encounter  Minor head injury, initial encounter    ED Discharge Orders     None          Yolande Lamar BROCKS, MD 02/29/24 1728

## 2024-02-29 NOTE — ED Triage Notes (Signed)
 Pt BIB EMS from [facility] after falling and hitting the back of her head, small laceration with bleeding controlled. No thinners, no LOC.

## 2024-02-29 NOTE — ED Notes (Signed)
 Called EMS for transport back to [facility] - nurse aware

## 2024-03-05 DIAGNOSIS — R1312 Dysphagia, oropharyngeal phase: Secondary | ICD-10-CM | POA: Diagnosis not present

## 2024-03-05 DIAGNOSIS — M6281 Muscle weakness (generalized): Secondary | ICD-10-CM | POA: Diagnosis not present

## 2024-03-05 DIAGNOSIS — R269 Unspecified abnormalities of gait and mobility: Secondary | ICD-10-CM | POA: Diagnosis not present

## 2024-03-05 DIAGNOSIS — F01C18 Vascular dementia, severe, with other behavioral disturbance: Secondary | ICD-10-CM | POA: Diagnosis not present

## 2024-03-05 DIAGNOSIS — F802 Mixed receptive-expressive language disorder: Secondary | ICD-10-CM | POA: Diagnosis not present

## 2024-03-05 DIAGNOSIS — R2689 Other abnormalities of gait and mobility: Secondary | ICD-10-CM | POA: Diagnosis not present

## 2024-03-05 DIAGNOSIS — R296 Repeated falls: Secondary | ICD-10-CM | POA: Diagnosis not present

## 2024-03-05 DIAGNOSIS — R2681 Unsteadiness on feet: Secondary | ICD-10-CM | POA: Diagnosis not present

## 2024-03-07 DIAGNOSIS — Z9181 History of falling: Secondary | ICD-10-CM | POA: Diagnosis not present

## 2024-03-07 DIAGNOSIS — Z79899 Other long term (current) drug therapy: Secondary | ICD-10-CM | POA: Diagnosis not present

## 2024-03-07 DIAGNOSIS — S0003XA Contusion of scalp, initial encounter: Secondary | ICD-10-CM | POA: Diagnosis not present

## 2024-03-07 DIAGNOSIS — S0101XA Laceration without foreign body of scalp, initial encounter: Secondary | ICD-10-CM | POA: Diagnosis not present

## 2024-03-07 DIAGNOSIS — R52 Pain, unspecified: Secondary | ICD-10-CM | POA: Diagnosis not present

## 2024-03-08 DIAGNOSIS — R296 Repeated falls: Secondary | ICD-10-CM | POA: Diagnosis not present

## 2024-03-08 DIAGNOSIS — R2681 Unsteadiness on feet: Secondary | ICD-10-CM | POA: Diagnosis not present

## 2024-03-08 DIAGNOSIS — F802 Mixed receptive-expressive language disorder: Secondary | ICD-10-CM | POA: Diagnosis not present

## 2024-03-08 DIAGNOSIS — M6281 Muscle weakness (generalized): Secondary | ICD-10-CM | POA: Diagnosis not present

## 2024-03-08 DIAGNOSIS — R1312 Dysphagia, oropharyngeal phase: Secondary | ICD-10-CM | POA: Diagnosis not present

## 2024-03-08 DIAGNOSIS — R269 Unspecified abnormalities of gait and mobility: Secondary | ICD-10-CM | POA: Diagnosis not present

## 2024-03-08 DIAGNOSIS — F01C18 Vascular dementia, severe, with other behavioral disturbance: Secondary | ICD-10-CM | POA: Diagnosis not present

## 2024-03-08 DIAGNOSIS — R2689 Other abnormalities of gait and mobility: Secondary | ICD-10-CM | POA: Diagnosis not present

## 2024-03-11 DIAGNOSIS — R1312 Dysphagia, oropharyngeal phase: Secondary | ICD-10-CM | POA: Diagnosis not present

## 2024-03-11 DIAGNOSIS — F802 Mixed receptive-expressive language disorder: Secondary | ICD-10-CM | POA: Diagnosis not present

## 2024-03-11 DIAGNOSIS — R451 Restlessness and agitation: Secondary | ICD-10-CM | POA: Diagnosis not present

## 2024-03-11 DIAGNOSIS — R2689 Other abnormalities of gait and mobility: Secondary | ICD-10-CM | POA: Diagnosis not present

## 2024-03-11 DIAGNOSIS — R296 Repeated falls: Secondary | ICD-10-CM | POA: Diagnosis not present

## 2024-03-11 DIAGNOSIS — F411 Generalized anxiety disorder: Secondary | ICD-10-CM | POA: Diagnosis not present

## 2024-03-11 DIAGNOSIS — M6281 Muscle weakness (generalized): Secondary | ICD-10-CM | POA: Diagnosis not present

## 2024-03-11 DIAGNOSIS — R2681 Unsteadiness on feet: Secondary | ICD-10-CM | POA: Diagnosis not present

## 2024-03-11 DIAGNOSIS — F01C18 Vascular dementia, severe, with other behavioral disturbance: Secondary | ICD-10-CM | POA: Diagnosis not present

## 2024-03-11 DIAGNOSIS — R269 Unspecified abnormalities of gait and mobility: Secondary | ICD-10-CM | POA: Diagnosis not present

## 2024-03-12 DIAGNOSIS — R269 Unspecified abnormalities of gait and mobility: Secondary | ICD-10-CM | POA: Diagnosis not present

## 2024-03-12 DIAGNOSIS — F802 Mixed receptive-expressive language disorder: Secondary | ICD-10-CM | POA: Diagnosis not present

## 2024-03-12 DIAGNOSIS — M6281 Muscle weakness (generalized): Secondary | ICD-10-CM | POA: Diagnosis not present

## 2024-03-12 DIAGNOSIS — F01C18 Vascular dementia, severe, with other behavioral disturbance: Secondary | ICD-10-CM | POA: Diagnosis not present

## 2024-03-12 DIAGNOSIS — R296 Repeated falls: Secondary | ICD-10-CM | POA: Diagnosis not present

## 2024-03-12 DIAGNOSIS — R2689 Other abnormalities of gait and mobility: Secondary | ICD-10-CM | POA: Diagnosis not present

## 2024-03-12 DIAGNOSIS — R2681 Unsteadiness on feet: Secondary | ICD-10-CM | POA: Diagnosis not present

## 2024-03-12 DIAGNOSIS — R1312 Dysphagia, oropharyngeal phase: Secondary | ICD-10-CM | POA: Diagnosis not present

## 2024-03-14 DIAGNOSIS — R1312 Dysphagia, oropharyngeal phase: Secondary | ICD-10-CM | POA: Diagnosis not present

## 2024-03-14 DIAGNOSIS — M6281 Muscle weakness (generalized): Secondary | ICD-10-CM | POA: Diagnosis not present

## 2024-03-14 DIAGNOSIS — F802 Mixed receptive-expressive language disorder: Secondary | ICD-10-CM | POA: Diagnosis not present

## 2024-03-14 DIAGNOSIS — R2681 Unsteadiness on feet: Secondary | ICD-10-CM | POA: Diagnosis not present

## 2024-03-15 DIAGNOSIS — R1312 Dysphagia, oropharyngeal phase: Secondary | ICD-10-CM | POA: Diagnosis not present

## 2024-03-15 DIAGNOSIS — R2681 Unsteadiness on feet: Secondary | ICD-10-CM | POA: Diagnosis not present

## 2024-03-15 DIAGNOSIS — F802 Mixed receptive-expressive language disorder: Secondary | ICD-10-CM | POA: Diagnosis not present

## 2024-03-15 DIAGNOSIS — M6281 Muscle weakness (generalized): Secondary | ICD-10-CM | POA: Diagnosis not present

## 2024-03-15 DIAGNOSIS — F01C18 Vascular dementia, severe, with other behavioral disturbance: Secondary | ICD-10-CM | POA: Diagnosis not present

## 2024-03-16 DIAGNOSIS — F802 Mixed receptive-expressive language disorder: Secondary | ICD-10-CM | POA: Diagnosis not present

## 2024-03-16 DIAGNOSIS — R2681 Unsteadiness on feet: Secondary | ICD-10-CM | POA: Diagnosis not present

## 2024-03-16 DIAGNOSIS — R1312 Dysphagia, oropharyngeal phase: Secondary | ICD-10-CM | POA: Diagnosis not present

## 2024-03-16 DIAGNOSIS — M6281 Muscle weakness (generalized): Secondary | ICD-10-CM | POA: Diagnosis not present

## 2024-03-18 DIAGNOSIS — R1312 Dysphagia, oropharyngeal phase: Secondary | ICD-10-CM | POA: Diagnosis not present

## 2024-03-18 DIAGNOSIS — M6281 Muscle weakness (generalized): Secondary | ICD-10-CM | POA: Diagnosis not present

## 2024-03-18 DIAGNOSIS — R2681 Unsteadiness on feet: Secondary | ICD-10-CM | POA: Diagnosis not present

## 2024-03-18 DIAGNOSIS — F802 Mixed receptive-expressive language disorder: Secondary | ICD-10-CM | POA: Diagnosis not present

## 2024-03-21 DIAGNOSIS — M6281 Muscle weakness (generalized): Secondary | ICD-10-CM | POA: Diagnosis not present

## 2024-03-21 DIAGNOSIS — R1312 Dysphagia, oropharyngeal phase: Secondary | ICD-10-CM | POA: Diagnosis not present

## 2024-03-21 DIAGNOSIS — F802 Mixed receptive-expressive language disorder: Secondary | ICD-10-CM | POA: Diagnosis not present

## 2024-03-21 DIAGNOSIS — R2681 Unsteadiness on feet: Secondary | ICD-10-CM | POA: Diagnosis not present

## 2024-03-22 DIAGNOSIS — F0390 Unspecified dementia without behavioral disturbance: Secondary | ICD-10-CM | POA: Diagnosis not present

## 2024-03-22 DIAGNOSIS — R1312 Dysphagia, oropharyngeal phase: Secondary | ICD-10-CM | POA: Diagnosis not present

## 2024-03-22 DIAGNOSIS — K219 Gastro-esophageal reflux disease without esophagitis: Secondary | ICD-10-CM | POA: Diagnosis not present

## 2024-03-22 DIAGNOSIS — F802 Mixed receptive-expressive language disorder: Secondary | ICD-10-CM | POA: Diagnosis not present

## 2024-03-22 DIAGNOSIS — Z79899 Other long term (current) drug therapy: Secondary | ICD-10-CM | POA: Diagnosis not present

## 2024-03-22 DIAGNOSIS — I10 Essential (primary) hypertension: Secondary | ICD-10-CM | POA: Diagnosis not present

## 2024-03-22 DIAGNOSIS — R2681 Unsteadiness on feet: Secondary | ICD-10-CM | POA: Diagnosis not present

## 2024-03-22 DIAGNOSIS — M6281 Muscle weakness (generalized): Secondary | ICD-10-CM | POA: Diagnosis not present

## 2024-03-24 DIAGNOSIS — R2681 Unsteadiness on feet: Secondary | ICD-10-CM | POA: Diagnosis not present

## 2024-03-24 DIAGNOSIS — F802 Mixed receptive-expressive language disorder: Secondary | ICD-10-CM | POA: Diagnosis not present

## 2024-03-24 DIAGNOSIS — R1312 Dysphagia, oropharyngeal phase: Secondary | ICD-10-CM | POA: Diagnosis not present

## 2024-03-24 DIAGNOSIS — M6281 Muscle weakness (generalized): Secondary | ICD-10-CM | POA: Diagnosis not present

## 2024-03-25 DIAGNOSIS — F802 Mixed receptive-expressive language disorder: Secondary | ICD-10-CM | POA: Diagnosis not present

## 2024-03-25 DIAGNOSIS — M6281 Muscle weakness (generalized): Secondary | ICD-10-CM | POA: Diagnosis not present

## 2024-03-25 DIAGNOSIS — R1312 Dysphagia, oropharyngeal phase: Secondary | ICD-10-CM | POA: Diagnosis not present

## 2024-03-25 DIAGNOSIS — F01C18 Vascular dementia, severe, with other behavioral disturbance: Secondary | ICD-10-CM | POA: Diagnosis not present

## 2024-03-25 DIAGNOSIS — R2681 Unsteadiness on feet: Secondary | ICD-10-CM | POA: Diagnosis not present

## 2024-03-29 DIAGNOSIS — R2681 Unsteadiness on feet: Secondary | ICD-10-CM | POA: Diagnosis not present

## 2024-03-29 DIAGNOSIS — F01C18 Vascular dementia, severe, with other behavioral disturbance: Secondary | ICD-10-CM | POA: Diagnosis not present

## 2024-03-29 DIAGNOSIS — R1312 Dysphagia, oropharyngeal phase: Secondary | ICD-10-CM | POA: Diagnosis not present

## 2024-03-29 DIAGNOSIS — M6281 Muscle weakness (generalized): Secondary | ICD-10-CM | POA: Diagnosis not present

## 2024-03-29 DIAGNOSIS — F802 Mixed receptive-expressive language disorder: Secondary | ICD-10-CM | POA: Diagnosis not present

## 2024-03-31 DIAGNOSIS — M6281 Muscle weakness (generalized): Secondary | ICD-10-CM | POA: Diagnosis not present

## 2024-03-31 DIAGNOSIS — F802 Mixed receptive-expressive language disorder: Secondary | ICD-10-CM | POA: Diagnosis not present

## 2024-03-31 DIAGNOSIS — R1312 Dysphagia, oropharyngeal phase: Secondary | ICD-10-CM | POA: Diagnosis not present

## 2024-03-31 DIAGNOSIS — R2681 Unsteadiness on feet: Secondary | ICD-10-CM | POA: Diagnosis not present

## 2024-04-01 DIAGNOSIS — R2681 Unsteadiness on feet: Secondary | ICD-10-CM | POA: Diagnosis not present

## 2024-04-01 DIAGNOSIS — R1312 Dysphagia, oropharyngeal phase: Secondary | ICD-10-CM | POA: Diagnosis not present

## 2024-04-01 DIAGNOSIS — F802 Mixed receptive-expressive language disorder: Secondary | ICD-10-CM | POA: Diagnosis not present

## 2024-04-01 DIAGNOSIS — M6281 Muscle weakness (generalized): Secondary | ICD-10-CM | POA: Diagnosis not present

## 2024-04-04 DIAGNOSIS — F01C18 Vascular dementia, severe, with other behavioral disturbance: Secondary | ICD-10-CM | POA: Diagnosis not present

## 2024-04-04 DIAGNOSIS — M6281 Muscle weakness (generalized): Secondary | ICD-10-CM | POA: Diagnosis not present

## 2024-04-04 DIAGNOSIS — R1312 Dysphagia, oropharyngeal phase: Secondary | ICD-10-CM | POA: Diagnosis not present

## 2024-04-04 DIAGNOSIS — F802 Mixed receptive-expressive language disorder: Secondary | ICD-10-CM | POA: Diagnosis not present

## 2024-04-04 DIAGNOSIS — R2681 Unsteadiness on feet: Secondary | ICD-10-CM | POA: Diagnosis not present

## 2024-04-05 DIAGNOSIS — R1312 Dysphagia, oropharyngeal phase: Secondary | ICD-10-CM | POA: Diagnosis not present

## 2024-04-05 DIAGNOSIS — M6281 Muscle weakness (generalized): Secondary | ICD-10-CM | POA: Diagnosis not present

## 2024-04-05 DIAGNOSIS — R2681 Unsteadiness on feet: Secondary | ICD-10-CM | POA: Diagnosis not present

## 2024-04-05 DIAGNOSIS — F802 Mixed receptive-expressive language disorder: Secondary | ICD-10-CM | POA: Diagnosis not present

## 2024-04-05 DIAGNOSIS — F01C18 Vascular dementia, severe, with other behavioral disturbance: Secondary | ICD-10-CM | POA: Diagnosis not present

## 2024-04-07 DIAGNOSIS — F802 Mixed receptive-expressive language disorder: Secondary | ICD-10-CM | POA: Diagnosis not present

## 2024-04-07 DIAGNOSIS — R1312 Dysphagia, oropharyngeal phase: Secondary | ICD-10-CM | POA: Diagnosis not present

## 2024-04-07 DIAGNOSIS — R2681 Unsteadiness on feet: Secondary | ICD-10-CM | POA: Diagnosis not present

## 2024-04-07 DIAGNOSIS — F01C18 Vascular dementia, severe, with other behavioral disturbance: Secondary | ICD-10-CM | POA: Diagnosis not present

## 2024-04-13 DIAGNOSIS — K219 Gastro-esophageal reflux disease without esophagitis: Secondary | ICD-10-CM | POA: Diagnosis not present

## 2024-04-13 DIAGNOSIS — F0390 Unspecified dementia without behavioral disturbance: Secondary | ICD-10-CM | POA: Diagnosis not present

## 2024-04-13 DIAGNOSIS — D649 Anemia, unspecified: Secondary | ICD-10-CM | POA: Diagnosis not present

## 2024-04-13 DIAGNOSIS — E785 Hyperlipidemia, unspecified: Secondary | ICD-10-CM | POA: Diagnosis not present

## 2024-04-13 DIAGNOSIS — I10 Essential (primary) hypertension: Secondary | ICD-10-CM | POA: Diagnosis not present

## 2024-04-13 DIAGNOSIS — E559 Vitamin D deficiency, unspecified: Secondary | ICD-10-CM | POA: Diagnosis not present

## 2024-04-13 DIAGNOSIS — R4189 Other symptoms and signs involving cognitive functions and awareness: Secondary | ICD-10-CM | POA: Diagnosis not present

## 2024-04-13 DIAGNOSIS — J45909 Unspecified asthma, uncomplicated: Secondary | ICD-10-CM | POA: Diagnosis not present

## 2024-04-13 DIAGNOSIS — F419 Anxiety disorder, unspecified: Secondary | ICD-10-CM | POA: Diagnosis not present

## 2024-04-14 DIAGNOSIS — F802 Mixed receptive-expressive language disorder: Secondary | ICD-10-CM | POA: Diagnosis not present

## 2024-04-14 DIAGNOSIS — Z23 Encounter for immunization: Secondary | ICD-10-CM | POA: Diagnosis not present

## 2024-04-14 DIAGNOSIS — R296 Repeated falls: Secondary | ICD-10-CM | POA: Diagnosis not present

## 2024-04-14 DIAGNOSIS — R2689 Other abnormalities of gait and mobility: Secondary | ICD-10-CM | POA: Diagnosis not present

## 2024-04-14 DIAGNOSIS — R269 Unspecified abnormalities of gait and mobility: Secondary | ICD-10-CM | POA: Diagnosis not present

## 2024-04-15 DIAGNOSIS — R2689 Other abnormalities of gait and mobility: Secondary | ICD-10-CM | POA: Diagnosis not present

## 2024-04-15 DIAGNOSIS — R269 Unspecified abnormalities of gait and mobility: Secondary | ICD-10-CM | POA: Diagnosis not present

## 2024-04-15 DIAGNOSIS — Z23 Encounter for immunization: Secondary | ICD-10-CM | POA: Diagnosis not present

## 2024-04-15 DIAGNOSIS — R296 Repeated falls: Secondary | ICD-10-CM | POA: Diagnosis not present

## 2024-04-18 DIAGNOSIS — R269 Unspecified abnormalities of gait and mobility: Secondary | ICD-10-CM | POA: Diagnosis not present

## 2024-04-18 DIAGNOSIS — Z23 Encounter for immunization: Secondary | ICD-10-CM | POA: Diagnosis not present

## 2024-04-18 DIAGNOSIS — R296 Repeated falls: Secondary | ICD-10-CM | POA: Diagnosis not present

## 2024-04-18 DIAGNOSIS — F802 Mixed receptive-expressive language disorder: Secondary | ICD-10-CM | POA: Diagnosis not present

## 2024-04-18 DIAGNOSIS — M6281 Muscle weakness (generalized): Secondary | ICD-10-CM | POA: Diagnosis not present

## 2024-04-18 DIAGNOSIS — R2689 Other abnormalities of gait and mobility: Secondary | ICD-10-CM | POA: Diagnosis not present

## 2024-04-20 DIAGNOSIS — R296 Repeated falls: Secondary | ICD-10-CM | POA: Diagnosis not present

## 2024-04-20 DIAGNOSIS — R2689 Other abnormalities of gait and mobility: Secondary | ICD-10-CM | POA: Diagnosis not present

## 2024-04-20 DIAGNOSIS — F802 Mixed receptive-expressive language disorder: Secondary | ICD-10-CM | POA: Diagnosis not present

## 2024-04-20 DIAGNOSIS — R269 Unspecified abnormalities of gait and mobility: Secondary | ICD-10-CM | POA: Diagnosis not present

## 2024-04-20 DIAGNOSIS — M6281 Muscle weakness (generalized): Secondary | ICD-10-CM | POA: Diagnosis not present

## 2024-04-21 DIAGNOSIS — F802 Mixed receptive-expressive language disorder: Secondary | ICD-10-CM | POA: Diagnosis not present

## 2024-04-21 DIAGNOSIS — Z23 Encounter for immunization: Secondary | ICD-10-CM | POA: Diagnosis not present

## 2024-04-21 DIAGNOSIS — R269 Unspecified abnormalities of gait and mobility: Secondary | ICD-10-CM | POA: Diagnosis not present

## 2024-04-21 DIAGNOSIS — R2689 Other abnormalities of gait and mobility: Secondary | ICD-10-CM | POA: Diagnosis not present

## 2024-04-21 DIAGNOSIS — R296 Repeated falls: Secondary | ICD-10-CM | POA: Diagnosis not present

## 2024-04-21 DIAGNOSIS — M6281 Muscle weakness (generalized): Secondary | ICD-10-CM | POA: Diagnosis not present

## 2024-04-22 DIAGNOSIS — R296 Repeated falls: Secondary | ICD-10-CM | POA: Diagnosis not present

## 2024-04-22 DIAGNOSIS — M6281 Muscle weakness (generalized): Secondary | ICD-10-CM | POA: Diagnosis not present

## 2024-04-22 DIAGNOSIS — Z23 Encounter for immunization: Secondary | ICD-10-CM | POA: Diagnosis not present

## 2024-04-22 DIAGNOSIS — R2689 Other abnormalities of gait and mobility: Secondary | ICD-10-CM | POA: Diagnosis not present

## 2024-04-22 DIAGNOSIS — R269 Unspecified abnormalities of gait and mobility: Secondary | ICD-10-CM | POA: Diagnosis not present

## 2024-04-25 DIAGNOSIS — R269 Unspecified abnormalities of gait and mobility: Secondary | ICD-10-CM | POA: Diagnosis not present

## 2024-04-25 DIAGNOSIS — L603 Nail dystrophy: Secondary | ICD-10-CM | POA: Diagnosis not present

## 2024-04-25 DIAGNOSIS — M6281 Muscle weakness (generalized): Secondary | ICD-10-CM | POA: Diagnosis not present

## 2024-04-25 DIAGNOSIS — Z23 Encounter for immunization: Secondary | ICD-10-CM | POA: Diagnosis not present

## 2024-04-25 DIAGNOSIS — R2689 Other abnormalities of gait and mobility: Secondary | ICD-10-CM | POA: Diagnosis not present

## 2024-04-25 DIAGNOSIS — R296 Repeated falls: Secondary | ICD-10-CM | POA: Diagnosis not present

## 2024-04-25 DIAGNOSIS — I739 Peripheral vascular disease, unspecified: Secondary | ICD-10-CM | POA: Diagnosis not present

## 2024-04-25 DIAGNOSIS — L602 Onychogryphosis: Secondary | ICD-10-CM | POA: Diagnosis not present

## 2024-04-26 DIAGNOSIS — R269 Unspecified abnormalities of gait and mobility: Secondary | ICD-10-CM | POA: Diagnosis not present

## 2024-04-26 DIAGNOSIS — R2689 Other abnormalities of gait and mobility: Secondary | ICD-10-CM | POA: Diagnosis not present

## 2024-04-26 DIAGNOSIS — F802 Mixed receptive-expressive language disorder: Secondary | ICD-10-CM | POA: Diagnosis not present

## 2024-04-26 DIAGNOSIS — R296 Repeated falls: Secondary | ICD-10-CM | POA: Diagnosis not present

## 2024-04-26 DIAGNOSIS — M6281 Muscle weakness (generalized): Secondary | ICD-10-CM | POA: Diagnosis not present

## 2024-04-27 DIAGNOSIS — F802 Mixed receptive-expressive language disorder: Secondary | ICD-10-CM | POA: Diagnosis not present

## 2024-04-27 DIAGNOSIS — R296 Repeated falls: Secondary | ICD-10-CM | POA: Diagnosis not present

## 2024-04-27 DIAGNOSIS — M6281 Muscle weakness (generalized): Secondary | ICD-10-CM | POA: Diagnosis not present

## 2024-04-27 DIAGNOSIS — R2689 Other abnormalities of gait and mobility: Secondary | ICD-10-CM | POA: Diagnosis not present

## 2024-04-27 DIAGNOSIS — Z23 Encounter for immunization: Secondary | ICD-10-CM | POA: Diagnosis not present

## 2024-04-27 DIAGNOSIS — R269 Unspecified abnormalities of gait and mobility: Secondary | ICD-10-CM | POA: Diagnosis not present

## 2024-04-29 DIAGNOSIS — F01C18 Vascular dementia, severe, with other behavioral disturbance: Secondary | ICD-10-CM | POA: Diagnosis not present

## 2024-04-29 DIAGNOSIS — F411 Generalized anxiety disorder: Secondary | ICD-10-CM | POA: Diagnosis not present

## 2024-04-29 DIAGNOSIS — R451 Restlessness and agitation: Secondary | ICD-10-CM | POA: Diagnosis not present

## 2024-05-06 ENCOUNTER — Emergency Department (HOSPITAL_COMMUNITY)
Admission: EM | Admit: 2024-05-06 | Discharge: 2024-05-07 | Disposition: A | Discharge status: Home / Self Care | Attending: Emergency Medicine | Admitting: Emergency Medicine

## 2024-05-06 DIAGNOSIS — N3000 Acute cystitis without hematuria: Secondary | ICD-10-CM | POA: Insufficient documentation

## 2024-05-06 DIAGNOSIS — K529 Noninfective gastroenteritis and colitis, unspecified: Secondary | ICD-10-CM | POA: Insufficient documentation

## 2024-05-06 DIAGNOSIS — F0390 Unspecified dementia without behavioral disturbance: Secondary | ICD-10-CM | POA: Diagnosis not present

## 2024-05-06 DIAGNOSIS — I2510 Atherosclerotic heart disease of native coronary artery without angina pectoris: Secondary | ICD-10-CM | POA: Diagnosis not present

## 2024-05-06 DIAGNOSIS — R531 Weakness: Secondary | ICD-10-CM | POA: Diagnosis not present

## 2024-05-06 DIAGNOSIS — K921 Melena: Secondary | ICD-10-CM | POA: Diagnosis not present

## 2024-05-06 DIAGNOSIS — J45909 Unspecified asthma, uncomplicated: Secondary | ICD-10-CM | POA: Insufficient documentation

## 2024-05-07 ENCOUNTER — Other Ambulatory Visit: Payer: Self-pay

## 2024-05-07 ENCOUNTER — Encounter (HOSPITAL_COMMUNITY): Payer: Self-pay

## 2024-05-07 ENCOUNTER — Emergency Department (HOSPITAL_COMMUNITY)

## 2024-05-07 DIAGNOSIS — K5730 Diverticulosis of large intestine without perforation or abscess without bleeding: Secondary | ICD-10-CM | POA: Diagnosis not present

## 2024-05-07 DIAGNOSIS — K921 Melena: Secondary | ICD-10-CM | POA: Diagnosis not present

## 2024-05-07 DIAGNOSIS — K8689 Other specified diseases of pancreas: Secondary | ICD-10-CM | POA: Diagnosis not present

## 2024-05-07 DIAGNOSIS — K7689 Other specified diseases of liver: Secondary | ICD-10-CM | POA: Diagnosis not present

## 2024-05-07 DIAGNOSIS — R531 Weakness: Secondary | ICD-10-CM | POA: Diagnosis not present

## 2024-05-07 DIAGNOSIS — Z7401 Bed confinement status: Secondary | ICD-10-CM | POA: Diagnosis not present

## 2024-05-07 LAB — COMPREHENSIVE METABOLIC PANEL WITH GFR
ALT: 27 U/L (ref 0–44)
AST: 40 U/L (ref 15–41)
Albumin: 3.9 g/dL (ref 3.5–5.0)
Alkaline Phosphatase: 72 U/L (ref 38–126)
Anion gap: 9 (ref 5–15)
BUN: 14 mg/dL (ref 8–23)
CO2: 28 mmol/L (ref 22–32)
Calcium: 8.9 mg/dL (ref 8.9–10.3)
Chloride: 104 mmol/L (ref 98–111)
Creatinine, Ser: 0.78 mg/dL (ref 0.44–1.00)
GFR, Estimated: >60 mL/min (ref >60)
Glucose, Bld: 90 mg/dL (ref 70–99)
Potassium: 3.8 mmol/L (ref 3.5–5.1)
Sodium: 141 mmol/L (ref 135–145)
Total Bilirubin: 0.4 mg/dL (ref 0.0–1.2)
Total Protein: 6.8 g/dL (ref 6.5–8.1)

## 2024-05-07 LAB — CBC
HCT: 38 % (ref 36.0–46.0)
Hemoglobin: 12.5 g/dL (ref 12.0–15.0)
MCH: 31.7 pg (ref 26.0–34.0)
MCHC: 32.9 g/dL (ref 30.0–36.0)
MCV: 96.4 fL (ref 80.0–100.0)
Platelets: 155 K/uL (ref 150–400)
RBC: 3.94 MIL/uL (ref 3.87–5.11)
RDW: 12.3 % (ref 11.5–15.5)
WBC: 6.9 K/uL (ref 4.0–10.5)
nRBC: 0 % (ref 0.0–0.2)

## 2024-05-07 LAB — URINALYSIS, ROUTINE W REFLEX MICROSCOPIC
Bilirubin Urine: NEGATIVE
Glucose, UA: NEGATIVE mg/dL
Ketones, ur: NEGATIVE mg/dL
Leukocytes,Ua: NEGATIVE
Nitrite: POSITIVE — AB
Protein, ur: NEGATIVE mg/dL
Specific Gravity, Urine: 1.029 (ref 1.005–1.030)
pH: 5 (ref 5.0–8.0)

## 2024-05-07 MED ORDER — CEPHALEXIN 500 MG PO CAPS: 500.0000 mg | ORAL_CAPSULE | Freq: Three times a day (TID) | ORAL | 0 refills | Status: DC

## 2024-05-07 MED ORDER — AZITHROMYCIN 250 MG PO TABS: ORAL_TABLET | ORAL | 0 refills | Status: DC

## 2024-05-07 MED ORDER — CEPHALEXIN 500 MG PO CAPS: 500.0000 mg | ORAL_CAPSULE | Freq: Three times a day (TID) | ORAL | 0 refills | Status: AC

## 2024-05-07 MED ORDER — IOHEXOL 300 MG/ML  SOLN
100.0000 mL | Freq: Once | INTRAMUSCULAR | Status: AC | PRN
  Administered 2024-05-07: 80 mL via INTRAVENOUS

## 2024-05-07 MED ORDER — AZITHROMYCIN 250 MG PO TABS
ORAL_TABLET | ORAL | 0 refills | Status: DC
Start: 2024-05-07 — End: 2024-06-10

## 2024-05-07 MED ORDER — CEPHALEXIN 500 MG PO CAPS
500.0000 mg | ORAL_CAPSULE | Freq: Once | ORAL | Status: AC
  Administered 2024-05-07: 500 mg via ORAL
  Filled 2024-05-07: qty 1

## 2024-05-07 NOTE — Discharge Instructions (Signed)
 You were evaluated in the Emergency Department and after careful evaluation, we did not find any emergent condition requiring admission or further testing in the hospital.  Your exam/testing today is overall reassuring.  Bloody diarrhea may be due to an infectious colitis related to a virus or bacteria.  We also found evidence of a urinary tract infection.  Recommend taking the Keflex  and azithromycin  antibiotics as directed.  Please return to the Emergency Department if you experience any worsening of your condition.   Thank you for allowing us  to be a part of your care.

## 2024-05-07 NOTE — ED Triage Notes (Signed)
 Pt c/c of blood in the stools. EMS reported pt had two large bowel movements today and both had an a red appearance to them. Pt had shrimp today and they originally thought it was cocktail sauce, but per EMS it looked like blood. Pt has a hx of dementia.

## 2024-05-07 NOTE — ED Notes (Signed)
 Pt has a PMH of dementia. Pt is aware to name and birth date only for this RN. It was noted that the pt has DNR paperwork in the room.

## 2024-05-07 NOTE — ED Provider Notes (Signed)
 [facility] Emergency Department Provider Note MRN:  [id number]  Arrival date & time: 05/07/24     Chief Complaint   Hematochezia   History of Present Illness   Mikayla Wilson is a 89 y.o. year-old female with history of dementia, CAD presenting to the ED with chief complaint of hematochezia.  Possibly some blood in stool today.  Patient has no complaints currently.  Review of Systems  I was unable to obtain a full/accurate HPI, PMH, or ROS due to the patient's dementia.  Patient's Health History    Past Medical History:  Diagnosis Date   Allergy    Anxiety    Arthritis    Asthma    Cataract    Constipation    Coronary artery disease    Depression    Fibrocystic breast disease    GERD (gastroesophageal reflux disease)    Heart murmur    Hiatal hernia    Hiatal hernia    Osteoporosis     Past Surgical History:  Procedure Laterality Date   ABDOMINAL HYSTERECTOMY     CHOLECYSTECTOMY     CHOLECYSTECTOMY     EYE SURGERY     HIP SURGERY Right    INTRAMEDULLARY (IM) NAIL INTERTROCHANTERIC Right 11/30/2015   Procedure: INTRAMEDULLARY (IM) NAIL INTERTROCHANTERIC RIGHT HIP;  Surgeon: Lonni CINDERELLA Poli, MD;  Location: [facility];  Service: Orthopedics;  Laterality: Right;    Family History  Problem Relation Age of Onset   Arthritis Mother    Diabetes Mother    Heart disease Mother    Hypertension Mother    Miscarriages / Stillbirths Mother    Alcohol abuse Father    COPD Father    Cancer Sister        pancriatic cancer   Alcohol abuse Brother    Early death Brother    Heart disease Brother    Hypertension Brother     Social History   Socioeconomic History   Marital status: Widowed    Spouse name: Not on file   Number of children: Not on file   Years of education: Not on file   Highest education level: Not on file  Occupational History   Not on file  Tobacco Use   Smoking status: Never   Smokeless tobacco: Current    Types:  Snuff  Substance and Sexual Activity   Alcohol use: No   Drug use: No   Sexual activity: Never  Other Topics Concern   Not on file  Social History Narrative   Not on file   Social Drivers of Health   Financial Resource Strain: Not on file  Food Insecurity: Patient Unable To Answer (09/03/2023)   Hunger Vital Sign    Worried About Running Out of Food in the Last Year: Patient unable to answer    Ran Out of Food in the Last Year: Patient unable to answer  Transportation Needs: Patient Unable To Answer (09/03/2023)   PRAPARE - Transportation    Lack of Transportation (Medical): Patient unable to answer    Lack of Transportation (Non-Medical): Patient unable to answer  Physical Activity: Not on file  Stress: Not on file  Social Connections: Unknown (09/03/2023)   Social Connection and Isolation Panel    Frequency of Communication with Friends and Family: Patient unable to answer    Frequency of Social Gatherings with Friends and Family: Patient unable to answer    Attends Religious Services: Patient unable to answer    Active Member of  Clubs or Organizations: Patient unable to answer    Attends Club or Organization Meetings: Patient unable to answer    Marital Status: Widowed  Intimate Partner Violence: Patient Unable To Answer (09/03/2023)   Humiliation, Afraid, Rape, and Kick questionnaire    Fear of Current or Ex-Partner: Patient unable to answer    Emotionally Abused: Patient unable to answer    Physically Abused: Patient unable to answer    Sexually Abused: Patient unable to answer     Physical Exam   Vitals:   05/07/24 0200 05/07/24 0230  BP: (!) 112/58 (!) 106/54  Pulse: 79 83  Resp: (!) 23 16  Temp:    SpO2: 98% 97%    CONSTITUTIONAL: Chronically ill-appearing, NAD NEURO/PSYCH:  Alert and oriented x 3, no focal deficits EYES:  eyes equal and reactive ENT/NECK:  no LAD, no JVD CARDIO: Regular rate, well-perfused, normal S1 and S2 PULM:  CTAB no wheezing or  rhonchi GI/GU:  non-distended, non-tender MSK/SPINE:  No gross deformities, no edema SKIN:  no rash, atraumatic   *Additional and/or pertinent findings included in MDM below  Diagnostic and Interventional Summary    EKG Interpretation Date/Time:    Ventricular Rate:    PR Interval:    QRS Duration:    QT Interval:    QTC Calculation:   R Axis:      Text Interpretation:         Labs Reviewed  URINALYSIS, ROUTINE W REFLEX MICROSCOPIC - Abnormal; Notable for the following components:      Result Value   Hgb urine dipstick MODERATE (*)    Nitrite POSITIVE (*)    Bacteria, UA MANY (*)    All other components within normal limits  CBC  COMPREHENSIVE METABOLIC PANEL WITH GFR    CT ABDOMEN PELVIS W CONTRAST  Final Result      Medications  cephALEXin  (KEFLEX ) capsule 500 mg (500 mg Oral Given 05/07/24 0333)  iohexol (OMNIPAQUE) 300 MG/ML solution 100 mL (80 mLs Intravenous Contrast Given 05/07/24 0308)     Procedures  /  Critical Care Procedures  ED Course and Medical Decision Making  Initial Impression and Ddx Patient is well-appearing in no acute distress with normal vital signs, abdomen soft and nontender, she has no complaints, history of dementia.  Report of blood in stool today, will monitor for any further bleeding, check basic labs.  Currently does not seem like emergent or life-threatening bleeding based on her appearance.  Past medical/surgical history that increases complexity of ED encounter: Dementia  Interpretation of Diagnostics I personally reviewed the Laboratory Testing and my interpretation is as follows: No significant blood count or electrolyte disturbance.    Patient Reassessment and Ultimate Disposition/Management     Family here providing further history, some foul smell to urine recently.  Adding on urinalysis.  Multiple discussions with family with regard to the episode of bleeding at the facility, overall low concern for emergent or  life-threatening bleeding however family with a lot of reservations about leaving without further testing.  Obtaining CT abdomen pelvis.  4 AM update: CT revealing evidence of colitis as well as diverticulosis, both of which could be source of the bleeding.  No emergent process is found.  Given patient's age and comorbidity will treat this bloody diarrhea with antibiotics.  Also will treat the UTI.  After 4-1/2 hours patient has had no complaints, no abnormal vital signs, no abdominal tenderness, no repeat episodes of bloody diarrhea.  No indication for further testing  or admission, appropriate for discharge.  Patient management required discussion with the following services or consulting groups:  None  Complexity of Problems Addressed Acute illness or injury that poses threat of life of bodily function  Additional Data Reviewed and Analyzed Further history obtained from: Further history from spouse/family member  Additional Factors Impacting ED Encounter Risk Consideration of hospitalization  Ozell HERO. Theadore, MD [facility] Emergency Medicine [facility] [email or web address]  Final Clinical Impressions(s) / ED Diagnoses     ICD-10-CM   1. Colitis  K52.9     2. Acute cystitis without hematuria  N30.00     3. Blood in stool  K92.1       ED Discharge Orders          Ordered    cephALEXin  (KEFLEX ) 500 MG capsule  3 times daily        05/07/24 0425    azithromycin  (ZITHROMAX ) 250 MG tablet        05/07/24 0425             Discharge Instructions Discussed with and Provided to Patient:     Discharge Instructions      You were evaluated in the Emergency Department and after careful evaluation, we did not find any emergent condition requiring admission or further testing in the hospital.  Your exam/testing today is overall reassuring.  Bloody diarrhea may be due to an infectious colitis related to a virus or bacteria.  We also found evidence of a urinary  tract infection.  Recommend taking the Keflex  and azithromycin  antibiotics as directed.  Please return to the Emergency Department if you experience any worsening of your condition.   Thank you for allowing us  to be a part of your care.       Theadore Ozell HERO, MD 05/07/24 [id number]

## 2024-05-09 DIAGNOSIS — Z79899 Other long term (current) drug therapy: Secondary | ICD-10-CM | POA: Diagnosis not present

## 2024-05-09 DIAGNOSIS — K529 Noninfective gastroenteritis and colitis, unspecified: Secondary | ICD-10-CM | POA: Diagnosis not present

## 2024-05-09 DIAGNOSIS — N3000 Acute cystitis without hematuria: Secondary | ICD-10-CM | POA: Diagnosis not present

## 2024-05-09 DIAGNOSIS — K921 Melena: Secondary | ICD-10-CM | POA: Diagnosis not present

## 2024-06-03 DIAGNOSIS — R451 Restlessness and agitation: Secondary | ICD-10-CM | POA: Diagnosis not present

## 2024-06-03 DIAGNOSIS — F01C18 Vascular dementia, severe, with other behavioral disturbance: Secondary | ICD-10-CM | POA: Diagnosis not present

## 2024-06-03 DIAGNOSIS — F411 Generalized anxiety disorder: Secondary | ICD-10-CM | POA: Diagnosis not present

## 2024-06-06 DIAGNOSIS — Z79899 Other long term (current) drug therapy: Secondary | ICD-10-CM | POA: Diagnosis not present

## 2024-06-06 DIAGNOSIS — K219 Gastro-esophageal reflux disease without esophagitis: Secondary | ICD-10-CM | POA: Diagnosis not present

## 2024-06-09 ENCOUNTER — Inpatient Hospital Stay (HOSPITAL_COMMUNITY)
Admission: EM | Admit: 2024-06-09 | Discharge: 2024-06-15 | DRG: 956 | Disposition: A | Discharge status: Skilled Nursing Facility | Source: Skilled Nursing Facility | Attending: Internal Medicine | Admitting: Internal Medicine

## 2024-06-09 ENCOUNTER — Other Ambulatory Visit: Payer: Self-pay

## 2024-06-09 ENCOUNTER — Emergency Department (HOSPITAL_COMMUNITY)

## 2024-06-09 ENCOUNTER — Encounter (HOSPITAL_COMMUNITY): Payer: Self-pay

## 2024-06-09 DIAGNOSIS — J189 Pneumonia, unspecified organism: Secondary | ICD-10-CM

## 2024-06-09 DIAGNOSIS — E43 Unspecified severe protein-calorie malnutrition: Secondary | ICD-10-CM | POA: Insufficient documentation

## 2024-06-09 DIAGNOSIS — M160 Bilateral primary osteoarthritis of hip: Secondary | ICD-10-CM | POA: Diagnosis not present

## 2024-06-09 DIAGNOSIS — Z7189 Other specified counseling: Secondary | ICD-10-CM | POA: Diagnosis not present

## 2024-06-09 DIAGNOSIS — F0390 Unspecified dementia without behavioral disturbance: Secondary | ICD-10-CM | POA: Diagnosis not present

## 2024-06-09 DIAGNOSIS — Z515 Encounter for palliative care: Secondary | ICD-10-CM | POA: Diagnosis not present

## 2024-06-09 DIAGNOSIS — M79606 Pain in leg, unspecified: Secondary | ICD-10-CM | POA: Diagnosis not present

## 2024-06-09 DIAGNOSIS — W19XXXA Unspecified fall, initial encounter: Secondary | ICD-10-CM | POA: Diagnosis not present

## 2024-06-09 DIAGNOSIS — J4520 Mild intermittent asthma, uncomplicated: Secondary | ICD-10-CM

## 2024-06-09 DIAGNOSIS — F419 Anxiety disorder, unspecified: Secondary | ICD-10-CM | POA: Diagnosis present

## 2024-06-09 DIAGNOSIS — S72002A Fracture of unspecified part of neck of left femur, initial encounter for closed fracture: Secondary | ICD-10-CM | POA: Diagnosis not present

## 2024-06-09 DIAGNOSIS — E860 Dehydration: Secondary | ICD-10-CM | POA: Diagnosis not present

## 2024-06-09 DIAGNOSIS — F03C4 Unspecified dementia, severe, with anxiety: Secondary | ICD-10-CM | POA: Diagnosis not present

## 2024-06-09 DIAGNOSIS — E440 Moderate protein-calorie malnutrition: Secondary | ICD-10-CM | POA: Insufficient documentation

## 2024-06-09 DIAGNOSIS — S72142A Displaced intertrochanteric fracture of left femur, initial encounter for closed fracture: Secondary | ICD-10-CM | POA: Diagnosis not present

## 2024-06-09 DIAGNOSIS — Z789 Other specified health status: Secondary | ICD-10-CM | POA: Diagnosis not present

## 2024-06-09 DIAGNOSIS — F418 Other specified anxiety disorders: Secondary | ICD-10-CM | POA: Diagnosis not present

## 2024-06-09 DIAGNOSIS — Z66 Do not resuscitate: Secondary | ICD-10-CM | POA: Diagnosis not present

## 2024-06-09 DIAGNOSIS — F32A Depression, unspecified: Secondary | ICD-10-CM | POA: Diagnosis not present

## 2024-06-09 DIAGNOSIS — R600 Localized edema: Secondary | ICD-10-CM | POA: Diagnosis not present

## 2024-06-09 DIAGNOSIS — S0990XA Unspecified injury of head, initial encounter: Secondary | ICD-10-CM | POA: Diagnosis not present

## 2024-06-09 DIAGNOSIS — J159 Unspecified bacterial pneumonia: Secondary | ICD-10-CM | POA: Diagnosis not present

## 2024-06-09 DIAGNOSIS — S199XXA Unspecified injury of neck, initial encounter: Secondary | ICD-10-CM | POA: Diagnosis not present

## 2024-06-09 DIAGNOSIS — J45909 Unspecified asthma, uncomplicated: Secondary | ICD-10-CM | POA: Diagnosis present

## 2024-06-09 DIAGNOSIS — Z043 Encounter for examination and observation following other accident: Secondary | ICD-10-CM | POA: Diagnosis not present

## 2024-06-09 DIAGNOSIS — S32110A Nondisplaced Zone I fracture of sacrum, initial encounter for closed fracture: Secondary | ICD-10-CM | POA: Diagnosis not present

## 2024-06-09 DIAGNOSIS — I2510 Atherosclerotic heart disease of native coronary artery without angina pectoris: Secondary | ICD-10-CM | POA: Diagnosis not present

## 2024-06-09 LAB — CBC WITH DIFFERENTIAL/PLATELET
Abs Immature Granulocytes: 0.05 K/uL (ref 0.00–0.07)
Basophils Absolute: 0 K/uL (ref 0.0–0.1)
Basophils Relative: 0 %
Eosinophils Absolute: 0.5 K/uL (ref 0.0–0.5)
Eosinophils Relative: 5 %
HCT: 37.5 % (ref 36.0–46.0)
Hemoglobin: 12.3 g/dL (ref 12.0–15.0)
Immature Granulocytes: 0 %
Lymphocytes Relative: 32 %
Lymphs Abs: 3.6 K/uL (ref 0.7–4.0)
MCH: 31.5 pg (ref 26.0–34.0)
MCHC: 32.8 g/dL (ref 30.0–36.0)
MCV: 96.2 fL (ref 80.0–100.0)
Monocytes Absolute: 0.5 K/uL (ref 0.1–1.0)
Monocytes Relative: 5 %
Neutro Abs: 6.4 K/uL (ref 1.7–7.7)
Neutrophils Relative %: 58 %
Platelets: 169 K/uL (ref 150–400)
RBC: 3.9 MIL/uL (ref 3.87–5.11)
RDW: 13.1 % (ref 11.5–15.5)
WBC: 11.1 K/uL — ABNORMAL HIGH (ref 4.0–10.5)
nRBC: 0 % (ref 0.0–0.2)

## 2024-06-09 LAB — COMPREHENSIVE METABOLIC PANEL WITH GFR
ALT: 24 U/L (ref 0–44)
AST: 30 U/L (ref 15–41)
Albumin: 4.1 g/dL (ref 3.5–5.0)
Alkaline Phosphatase: 86 U/L (ref 38–126)
Anion gap: 11 (ref 5–15)
BUN: 18 mg/dL (ref 8–23)
CO2: 27 mmol/L (ref 22–32)
Calcium: 9.4 mg/dL (ref 8.9–10.3)
Chloride: 106 mmol/L (ref 98–111)
Creatinine, Ser: 0.9 mg/dL (ref 0.44–1.00)
GFR, Estimated: >60 mL/min (ref >60)
Glucose, Bld: 103 mg/dL — ABNORMAL HIGH (ref 70–99)
Potassium: 4.5 mmol/L (ref 3.5–5.1)
Sodium: 144 mmol/L (ref 135–145)
Total Bilirubin: 0.3 mg/dL (ref 0.0–1.2)
Total Protein: 7 g/dL (ref 6.5–8.1)

## 2024-06-09 LAB — PROTIME-INR
INR: 0.9 (ref 0.8–1.2)
Prothrombin Time: 12.7 s (ref 11.4–15.2)

## 2024-06-09 LAB — APTT: aPTT: <20 s — ABNORMAL LOW (ref 24–36)

## 2024-06-09 MED ORDER — SODIUM CHLORIDE 0.9 % IV SOLN
2.0000 g | INTRAVENOUS | Status: AC
  Administered 2024-06-09 – 2024-06-13 (×5): 2 g via INTRAVENOUS
  Filled 2024-06-09 (×6): qty 20

## 2024-06-09 MED ORDER — FENTANYL CITRATE (PF) 100 MCG/2ML IJ SOLN
12.5000 ug | Freq: Once | INTRAMUSCULAR | Status: AC
  Administered 2024-06-09: 12.5 ug via INTRAVENOUS
  Filled 2024-06-09: qty 2

## 2024-06-09 MED ORDER — HEPARIN SODIUM (PORCINE) 5000 UNIT/ML IJ SOLN
5000.0000 [IU] | Freq: Three times a day (TID) | INTRAMUSCULAR | Status: DC
  Administered 2024-06-09 – 2024-06-15 (×17): 5000 [IU] via SUBCUTANEOUS
  Filled 2024-06-09 (×17): qty 1

## 2024-06-09 MED ORDER — LACTATED RINGERS IV SOLN: INTRAVENOUS | Status: AC

## 2024-06-09 MED ORDER — OLANZAPINE 5 MG PO TBDP
5.0000 mg | ORAL_TABLET | Freq: Every day | ORAL | Status: DC
  Administered 2024-06-09 – 2024-06-14 (×6): 5 mg via ORAL
  Filled 2024-06-09 (×7): qty 1

## 2024-06-09 MED ORDER — POLYETHYLENE GLYCOL 3350 17 G PO PACK: 17.0000 g | PACK | Freq: Every day | ORAL | Status: DC | PRN

## 2024-06-09 MED ORDER — ESCITALOPRAM OXALATE 10 MG PO TABS
5.0000 mg | ORAL_TABLET | Freq: Every day | ORAL | Status: DC
  Administered 2024-06-12 – 2024-06-15 (×4): 5 mg via ORAL
  Filled 2024-06-09 (×5): qty 1

## 2024-06-09 MED ORDER — LORAZEPAM 2 MG/ML IJ SOLN
0.5000 mg | Freq: Three times a day (TID) | INTRAMUSCULAR | Status: DC | PRN
  Administered 2024-06-10 – 2024-06-12 (×5): 0.5 mg via INTRAVENOUS
  Filled 2024-06-09 (×6): qty 1

## 2024-06-09 MED ORDER — LACTATED RINGERS IV BOLUS
500.0000 mL | Freq: Once | INTRAVENOUS | Status: AC
  Administered 2024-06-09: 500 mL via INTRAVENOUS

## 2024-06-09 MED ORDER — LORAZEPAM 2 MG/ML IJ SOLN
0.5000 mg | Freq: Once | INTRAMUSCULAR | Status: AC
  Administered 2024-06-09: 0.5 mg via INTRAVENOUS
  Filled 2024-06-09: qty 1

## 2024-06-09 MED ORDER — SODIUM CHLORIDE 0.9 % IV SOLN
500.0000 mg | INTRAVENOUS | Status: DC
  Administered 2024-06-09 – 2024-06-11 (×3): 500 mg via INTRAVENOUS
  Filled 2024-06-09 (×4): qty 5

## 2024-06-09 MED ORDER — HYDROMORPHONE HCL 1 MG/ML IJ SOLN
0.5000 mg | INTRAMUSCULAR | Status: DC | PRN
  Administered 2024-06-09 – 2024-06-13 (×17): 0.5 mg via INTRAVENOUS
  Filled 2024-06-09 (×18): qty 0.5

## 2024-06-09 NOTE — ED Triage Notes (Signed)
 Patient arrives via [facility] from [facility]. Per staff at facility, patient was an unwitnessed fall without LOC. Patient currently endorses L hip pain, 10/10. L leg appears shortened and externally rotated.

## 2024-06-09 NOTE — ED Notes (Signed)
 Family to desk, wanting her to have something to calm her down. Provider messaged.

## 2024-06-09 NOTE — ED Provider Notes (Signed)
 [place] EMERGENCY DEPARTMENT AT [facility] Provider Note   CSN: [id number] Arrival date & time: 06/09/24  1334     Patient presents with: Mikayla Wilson Mikayla Wilson is a 89 y.o. female.    Fall       Mikayla Wilson is a 89 y.o. female who resides at [facility], presents to the Emergency Department via EMS for evaluation of unwitnessed fall that occurred earlier today.  I contacted nursing facility to obtain history as patient has history of dementia and unable to provide relevant information.  Nurse at the facility states that she has dementia and screams frequently at baseline.  She holds her self around in a wheelchair, was found lying in the floor approximately 2 feet away from her wheelchair in another patient's room shortly before ER arrival.  Nurse at the facility states that she was awake and alert and was pointing to her left hip as source of pain.  Caregiver at facility denies any use of blood thinners.  Patient is DNR   Prior to Admission medications   Medication Sig Start Date End Date Taking? Authorizing Provider  acetaminophen  (TYLENOL ) 500 MG tablet Take 2 tablets (1,000 mg total) by mouth every 8 (eight) hours. 09/08/23   Evonnie Lenis, MD  acetaminophen  (TYLENOL ) 500 MG tablet Take 1 tablet (500 mg total) by mouth every 6 (six) hours as needed. 11/26/23   Horton, Charmaine FALCON, MD  azithromycin  (ZITHROMAX ) 250 MG tablet Take 2 tablets together on the first day, then 1 every day until finished. 05/07/24   Theadore Ozell CHRISTELLA, MD  cyanocobalamin  (VITAMIN B12) 500 MCG tablet Take 1 tablet (500 mcg total) by mouth daily. 09/09/23   Evonnie Lenis, MD  escitalopram  (LEXAPRO ) 5 MG tablet Take 1 tablet (5 mg total) by mouth daily. 06/10/23   Dettinger, Fonda LABOR, MD  famotidine  (PEPCID ) 20 MG tablet Take 1 tablet (20 mg total) by mouth 2 (two) times daily. 06/10/23   Dettinger, Fonda LABOR, MD  feeding supplement (ENSURE ENLIVE / ENSURE PLUS) LIQD Take 237 mLs by mouth 2 (two)  times daily between meals. 09/08/23   Evonnie Lenis, MD  ketotifen  (ZADITOR ) 0.025 % ophthalmic solution Place 1 drop into both eyes 6 (six) times daily.    [provider]  loratadine  (EQ LORATADINE ) 10 MG tablet Take 1 tablet (10 mg total) by mouth daily. TAKE 1 TABLET BY MOUTH ONCE DAILY AS NEEDED FOR ALLERGIES 06/10/23   Dettinger, Fonda LABOR, MD  LORazepam  (ATIVAN ) 1 MG tablet Take 1 tablet (1 mg total) by mouth every morning. 09/09/23   Evonnie Lenis, MD  omeprazole  (PRILOSEC) 20 MG capsule Take 1 capsule (20 mg total) by mouth daily. 06/10/23   Dettinger, Fonda LABOR, MD  potassium chloride  (KLOR-CON  M) 10 MEQ tablet Take 1 tablet (10 mEq total) by mouth daily. 06/10/23   Dettinger, Fonda LABOR, MD  rosuvastatin  (CRESTOR ) 5 MG tablet Take 1 tablet (5 mg total) by mouth daily. 06/10/23   Dettinger, Fonda LABOR, MD    Allergies: Aspirin and Morphine and codeine    Review of Systems  Unable to perform ROS: Dementia    Updated Vital Signs BP (!) 140/128   Pulse (!) 118   Temp 98.7 F (37.1 C) (Oral)   Resp (!) 30   Ht 5' 4 (1.626 m)   Wt 59.9 kg   SpO2 100%   BMI 22.67 kg/m   Physical Exam Vitals and nursing note reviewed.  HENT:  Head: Atraumatic.     Mouth/Throat:     Mouth: Mucous membranes are moist.  Eyes:     Conjunctiva/sclera: Conjunctivae normal.     Pupils: Pupils are equal, round, and reactive to light.  Cardiovascular:     Rate and Rhythm: Normal rate and regular rhythm.     Pulses: Normal pulses.  Pulmonary:     Effort: Pulmonary effort is normal.  Chest:     Chest wall: No tenderness.  Abdominal:     Palpations: Abdomen is soft.     Tenderness: There is no abdominal tenderness.  Musculoskeletal:        General: Tenderness and signs of injury present.     Cervical back: Normal range of motion. No tenderness.     Left hip: Tenderness and bony tenderness present.     Right lower leg: No edema.     Left lower leg: No edema.     Comments: Patient hollers out  in pain with palpation or attempted movement of the left hip.  Left lower extremity appears shortened and externally rotated.  Skin:    General: Skin is warm.     Capillary Refill: Capillary refill takes less than 2 seconds.  Neurological:     Mental Status: She is alert.     (all labs ordered are listed, but only abnormal results are displayed) Labs Reviewed  COMPREHENSIVE METABOLIC PANEL WITH GFR - Abnormal; Notable for the following components:      Result Value   Glucose, Bld 103 (*)    All other components within normal limits  CBC WITH DIFFERENTIAL/PLATELET - Abnormal; Notable for the following components:   WBC 11.1 (*)    All other components within normal limits  APTT - Abnormal; Notable for the following components:   aPTT <20 (*)    All other components within normal limits  PROTIME-INR  CBC WITH DIFFERENTIAL/PLATELET    EKG: EKG Interpretation Date/Time:  Thursday June 09 2024 13:45:43 EST Ventricular Rate:  88 PR Interval:    QRS Duration:  127 QT Interval:  402 QTC Calculation: 487 R Axis:   -47  Text Interpretation: Sinus rhythm RBBB and LAFB Artifact in lead(s) I II aVR aVL aVF V2 V3 V4 Confirmed by Suzette Pac [phone]) on 06/09/2024 2:30:02 PM  Radiology: CT Hip Left Wo Contrast Result Date: 06/09/2024 CLINICAL DATA:  Trauma. Hip fracture suspected on current radiographs. EXAM: CT OF THE LEFT HIP WITHOUT CONTRAST TECHNIQUE: Multidetector CT imaging of the left hip was performed according to the standard protocol. Multiplanar CT image reconstructions were also generated. RADIATION DOSE REDUCTION: This exam was performed according to the departmental dose-optimization program which includes automated exposure control, adjustment of the mA and/or kV according to patient size and/or use of iterative reconstruction technique. COMPARISON:  Left hip radiographs, 06/09/2024. FINDINGS: Bones/Joint/Cartilage Acute comminuted and mildly displaced fracture of the  proximal left femur. Fracture extends across the base of the neck, but also has an inter trochanteric component with fractures crossing the bases of the greater and lesser trochanters. Displacement of the primary fracture components of approximately 1 cm. There is also varus and apex anterior angulation. There is also an irregular fracture of the left sacral ala of unclear chronicity. There is a lucent appearing lesion within the right sacral ala, as well as a similar area in the left sacral ala. Several lucencies are noted along the sacrum. Within the areas of lucency there is relative increased density, which is also apparent at the fracture  site of the proximal left femur. Old healed fracture of the inferior left pubic ramus. Hip joint remains normally aligned. Ligaments Suboptimally assessed by CT. Muscles and Tendons No evidence of a muscle or tendon injury. There is edema within the soft tissues most evident posterior to the fractured proximal left femur. Soft tissues Soft tissue edema/hemorrhage most evident along the posterior margin of the fracture proximal left femur. IMPRESSION: 1. Comminuted, mildly displaced and angulated fracture proximal left femur fractures, intertrochanteric and at the base of the femoral neck. 2. Nondisplaced fracture of the left sacral ala of unclear chronicity. 3. Evidence of lucent bone lesions with lesions suggested at the proximal femur fracture and left sacral ala fracture. The proximal left femur fracture could be pathologic. Correlate with patient's history. Electronically Signed   By: Alm Parkins M.D.   On: 06/09/2024 17:13   CT Head Wo Contrast Result Date: 06/09/2024 EXAM: CT HEAD AND CERVICAL SPINE 06/09/2024 03:37:00 PM TECHNIQUE: CT of the head and cervical spine was performed without the administration of intravenous contrast. Multiplanar reformatted images are provided for review. Automated exposure control, iterative reconstruction, and/or weight based  adjustment of the mA/kV was utilized to reduce the radiation dose to as low as reasonably achievable. COMPARISON: CT Head and CT Cervical Spine Feb 29, 2024 CLINICAL HISTORY: Head trauma, minor (Age >= 65y) FINDINGS: CT HEAD BRAIN AND VENTRICLES: No acute intracranial hemorrhage. No mass effect or midline shift. No abnormal extra-axial fluid collection. No evidence of acute infarct. No hydrocephalus. Similar cerebral atrophy and chronic microvascular ischemic change. ORBITS: No acute abnormality. SINUSES AND MASTOIDS: No acute abnormality. SOFT TISSUES AND SKULL: No acute skull fracture. No acute soft tissue abnormality. CT CERVICAL SPINE BONES AND ALIGNMENT: No acute fracture or traumatic malalignment. DEGENERATIVE CHANGES: No significant degenerative changes. SOFT TISSUES: No prevertebral soft tissue swelling. LUNG APICES: Peripheral right upper lobe consolidation with surrounding nodularity. IMPRESSION: 1. No acute intracranial abnormality. 2. No acute fracture or traumatic malalignment of the cervical spine. 3. Peripheral right upper lobe consolidation with surrounding nodularity, most likely infectious/inflammatory. Recommend follow up CT chest in 3 months to further evaluate. Electronically signed by: Gilmore Molt MD 06/09/2024 03:53 PM EST RP Workstation: [id number]   CT Cervical Spine Wo Contrast Result Date: 06/09/2024 EXAM: CT HEAD AND CERVICAL SPINE 06/09/2024 03:37:00 PM TECHNIQUE: CT of the head and cervical spine was performed without the administration of intravenous contrast. Multiplanar reformatted images are provided for review. Automated exposure control, iterative reconstruction, and/or weight based adjustment of the mA/kV was utilized to reduce the radiation dose to as low as reasonably achievable. COMPARISON: CT Head and CT Cervical Spine Feb 29, 2024 CLINICAL HISTORY: Head trauma, minor (Age >= 65y) FINDINGS: CT HEAD BRAIN AND VENTRICLES: No acute intracranial hemorrhage. No mass effect  or midline shift. No abnormal extra-axial fluid collection. No evidence of acute infarct. No hydrocephalus. Similar cerebral atrophy and chronic microvascular ischemic change. ORBITS: No acute abnormality. SINUSES AND MASTOIDS: No acute abnormality. SOFT TISSUES AND SKULL: No acute skull fracture. No acute soft tissue abnormality. CT CERVICAL SPINE BONES AND ALIGNMENT: No acute fracture or traumatic malalignment. DEGENERATIVE CHANGES: No significant degenerative changes. SOFT TISSUES: No prevertebral soft tissue swelling. LUNG APICES: Peripheral right upper lobe consolidation with surrounding nodularity. IMPRESSION: 1. No acute intracranial abnormality. 2. No acute fracture or traumatic malalignment of the cervical spine. 3. Peripheral right upper lobe consolidation with surrounding nodularity, most likely infectious/inflammatory. Recommend follow up CT chest in 3 months to further evaluate. Electronically signed  by: Gilmore Molt MD 06/09/2024 03:53 PM EST RP Workstation: [id number]   DG Hip Unilat W or Wo Pelvis 2-3 Views Left Result Date: 06/09/2024 CLINICAL DATA:  Status post fall with shortened and externally rotated left leg EXAM: DG HIP (WITH OR WITHOUT PELVIS) 3V LEFT COMPARISON:  CT abdomen and pelvis dated 05/07/2024, pelvis radiograph dated 09/05/2023 FINDINGS: Surgical changes of proximal right femoral fixation. Old fracture deformity of the left inferior pubic ramus and left hemi sacrum. Irregular lucency through the intertrochanteric left femur. No acute hip dislocation. Degenerative changes of the bilateral hips. IMPRESSION: Irregular lucency through the intertrochanteric left femur, suspicious for acute fracture. Electronically Signed   By: Limin  Xu M.D.   On: 06/09/2024 14:33   DG Chest Port 1 View Result Date: 06/09/2024 CLINICAL DATA:  [facility] at a living facility. EXAM: PORTABLE CHEST 1 VIEW COMPARISON:  11/25/2023 and older studies. FINDINGS: Cardiac silhouette is normal in size. No  mediastinal or hilar masses. Chronic scarring and interstitial thickening in the lungs. No evidence of pneumonia or pulmonary edema. No pleural effusion or pneumothorax. Skeletal structures are demineralized with no convincing acute fracture. IMPRESSION: No acute findings. Electronically Signed   By: Alm Parkins M.D.   On: 06/09/2024 14:32     Procedures   Medications Ordered in the ED  fentaNYL  (SUBLIMAZE ) injection 12.5 mcg (has no administration in time range)                                    Medical Decision Making   Patient was DNR here from [facility] for evaluation of unwitnessed fall earlier today.  Rolls her self around in a wheelchair at baseline.  I spoke with nurse at facility, she does scream frequently with her dementia which is apparently her baseline.  She was found lying in the floor on her left side and pointing to her left hip as source of pain.  Does not take blood thinners.   Clinically, I suspect there is a hip fracture as her left lower extremity is shortened and externally rotated.  Neurovascularly intact.  She is moving her neck and upper extremities without difficulty.  Do not appreciate any scalp hematomas  Patient also seen by Dr. Zammit and care plan discussed.     Amount and/or Complexity of Data Reviewed Labs: ordered.    Details: Labs without clinical significant Radiology: ordered.    Details: Portable x-ray of left hip reviewed by me, possible inner troch fracture.  Chest x-ray without acute findings  Will proceed with CT imaging head, C-spine and left hip ECG/medicine tests: ordered.    Details: EKG shows sinus rhythm right bundle branch block and left anterior fascicular block Discussion of management or test interpretation with external provider(s):   Patient received pain medication and small dose of Ativan .  Now appears more calm.  Will proceed with CT imaging  CT left hip confirms fracture.  Daughter now at bedside who is also  POA.  Patient is DNR.  I have discussed risks involved with surgery and that patient may not be suitable surgical candidate, through shared decision making with the family, the daughter would like to have orthopedic consultation to discuss surgical options.  Consulted Dr. Beverley with orthopedics.  Recommends hospitalist admission and transferred to [facility] if family chooses to proceed with surgical intervention. I have thoroughly discussed both surgical and nonsurgical options with family, they would like to proceed  with surgical evaluation/treatment  Discussed findings with [facility] hospitalist, Dr. Pearlean who agrees to admit and arrange for transfer [facility]  Risk Prescription drug management.        Final diagnoses:  Closed fracture of left hip, initial encounter [facility]    ED Discharge Orders     None          Herlinda Madelin RIGGERS 06/09/24 1923    Cleotilde Rogue, MD 06/09/24 2002

## 2024-06-09 NOTE — ED Notes (Signed)
 Pt O2 sat dropped to 86% after dilaudid  dose. Pt equipped with 3L [place] O2 with improvement to 94%.

## 2024-06-09 NOTE — ED Notes (Signed)
 [facility] will be here in 5-10 min

## 2024-06-09 NOTE — ED Notes (Signed)
 Spoke with [employer] over the phone and gave update on pt's plan of care.

## 2024-06-09 NOTE — ED Notes (Signed)
 Hospitalist at bedside

## 2024-06-09 NOTE — H&P (Addendum)
 History and Physical    Mikayla Wilson FMW:[id number] DOB: 1931/05/04 DOA: 06/09/2024  PCP: Pcp, No   Patient coming from: Mikayla Wilson  I have personally briefly reviewed patient's old medical records in [facility]  Chief Complaint: Fall  HPI: Mikayla Wilson is a 89 y.o. female with medical history significant for dementia, asthma, coronary artery disease. Patient was brought to the ED reports of an unwitnessed fall.  Patient's ambulates with a wheelchair at baseline.  Patient was found at the head of the hallway from her room, in another patient's room, on the floor, with her wheelchair 2 feet away from her.  Patient has advanced dementia, does not recognize family. Patient's daughter Mikayla Wilson is at bedside.  Patient has not had a cough or difficulty breathing.   My evaluation, patient is awake, yelling out loud, incomprehensible.  ED Course: Temperature 98.7.  Heart rate 96-118.  Respirate rate 15-30.  Blood pressure systolic 121-144.  O2 sat greater 93% on room air. WBC 11.1. Chest x-ray negative for acute abnormality. Head and cervical CT-no acute intracranial abnormality, right upper lobe consolidation with surrounding nodularity, most likely infectious/inflammatory. Left hip CT-comminuted, mildly displaced and angulated fracture proximal left femur fractures,intertrochanteric and at the base of the femoral neck. IV Ativan  0.5 x 2 given.  IV Fentanyl  12.5 x 2 given. EDP consulted Dr. Beverley on-call for Ortho, recommended admission to [name], family opting for surgical evaluation, than nonsurgical management.  Review of Systems: Unable to assess due to advanced baseline dementia.  Past Medical History:  Diagnosis Date   Allergy    Anxiety    Arthritis    Asthma    Cataract    Constipation    Coronary artery disease    Depression    Fibrocystic breast disease    GERD (gastroesophageal reflux disease)    Heart murmur    Hiatal hernia    Hiatal hernia     Osteoporosis     Past Surgical History:  Procedure Laterality Date   ABDOMINAL HYSTERECTOMY     CHOLECYSTECTOMY     CHOLECYSTECTOMY     EYE SURGERY     HIP SURGERY Right    INTRAMEDULLARY (IM) NAIL INTERTROCHANTERIC Right 11/30/2015   Procedure: INTRAMEDULLARY (IM) NAIL INTERTROCHANTERIC RIGHT HIP;  Surgeon: Lonni CINDERELLA Poli, MD;  Location: [facility];  Service: Orthopedics;  Laterality: Right;     reports that she has never smoked. Her smokeless tobacco use includes snuff. She reports that she does not drink alcohol and does not use drugs.  Allergies  Allergen Reactions   Aspirin Other (See Comments)    My doctor told me to never take aspirin   Morphine And Codeine Other (See Comments)    Family History  Problem Relation Age of Onset   Arthritis Mother    Diabetes Mother    Heart disease Mother    Hypertension Mother    Miscarriages / Stillbirths Mother    Alcohol abuse Father    COPD Father    Cancer Sister        pancriatic cancer   Alcohol abuse Brother    Early death Brother    Heart disease Brother    Hypertension Brother     Prior to Admission medications   Medication Sig Start Date End Date Taking? Authorizing Provider  acetaminophen  (TYLENOL ) 500 MG tablet Take 2 tablets (1,000 mg total) by mouth every 8 (eight) hours. 09/08/23   Evonnie Lenis, MD  acetaminophen  (TYLENOL ) 500 MG tablet  Take 1 tablet (500 mg total) by mouth every 6 (six) hours as needed. 11/26/23   Horton, Charmaine FALCON, MD  azithromycin  (ZITHROMAX ) 250 MG tablet Take 2 tablets together on the first day, then 1 every day until finished. 05/07/24   Theadore Ozell HERO, MD  cyanocobalamin  (VITAMIN B12) 500 MCG tablet Take 1 tablet (500 mcg total) by mouth daily. 09/09/23   Evonnie Lenis, MD  escitalopram  (LEXAPRO ) 5 MG tablet Take 1 tablet (5 mg total) by mouth daily. 06/10/23   Dettinger, Fonda LABOR, MD  famotidine  (PEPCID ) 20 MG tablet Take 1 tablet (20 mg total) by mouth 2 (two) times daily. 06/10/23    Dettinger, Fonda LABOR, MD  feeding supplement (ENSURE ENLIVE / ENSURE PLUS) LIQD Take 237 mLs by mouth 2 (two) times daily between meals. 09/08/23   Evonnie Lenis, MD  ketotifen  (ZADITOR ) 0.025 % ophthalmic solution Place 1 drop into both eyes 6 (six) times daily.    [provider]  loratadine  (EQ LORATADINE ) 10 MG tablet Take 1 tablet (10 mg total) by mouth daily. TAKE 1 TABLET BY MOUTH ONCE DAILY AS NEEDED FOR ALLERGIES 06/10/23   Dettinger, Fonda LABOR, MD  LORazepam  (ATIVAN ) 1 MG tablet Take 1 tablet (1 mg total) by mouth every morning. 09/09/23   Evonnie Lenis, MD  omeprazole  (PRILOSEC) 20 MG capsule Take 1 capsule (20 mg total) by mouth daily. 06/10/23   Dettinger, Fonda LABOR, MD  potassium chloride  (KLOR-CON  M) 10 MEQ tablet Take 1 tablet (10 mEq total) by mouth daily. 06/10/23   Dettinger, Fonda LABOR, MD  rosuvastatin  (CRESTOR ) 5 MG tablet Take 1 tablet (5 mg total) by mouth daily. 06/10/23   Dettinger, Fonda LABOR, MD    Physical Exam: Vitals:   06/09/24 1715 06/09/24 1730 06/09/24 1830 06/09/24 1845  BP:  131/68 126/89 124/83  Pulse: 100 (!) 105 100 (!) 106  Resp: 19 20 15 18   Temp:      TempSrc:      SpO2: 94% 94% 96% 97%  Weight:      Height:        Constitutional: Patient yelling out, words/speech incomprehensible Vitals:   06/09/24 1715 06/09/24 1730 06/09/24 1830 06/09/24 1845  BP:  131/68 126/89 124/83  Pulse: 100 (!) 105 100 (!) 106  Resp: 19 20 15 18   Temp:      TempSrc:      SpO2: 94% 94% 96% 97%  Weight:      Height:       Eyes: PERRL, lids and conjunctivae normal ENMT: Mucous membranes are markedly dry Neck: normal, supple, no masses, no thyromegaly Respiratory: clear to auscultation bilaterally, no wheezing, no crackles. Normal respiratory effort. No accessory muscle use.  Cardiovascular: Regular rate and rhythm, no murmurs / rubs / gallops. No extremity edema.  Extremities warm. Abdomen: no tenderness, no masses palpated. No hepatosplenomegaly.  Musculoskeletal:  no clubbing / cyanosis.  Left lower extremity shortened and externally rotated Skin: no rashes, lesions, ulcers. No induration Neurologic: Limited exam due to baseline dementia.  No facial asymmetry. Psychiatric: Noted exam due to baseline dementia.  Awake.  Yelling out..   Labs on Admission: I have personally reviewed following labs and imaging studies  CBC: Recent Labs  Lab 06/09/24 1401  WBC 11.1*  NEUTROABS 6.4  HGB 12.3  HCT 37.5  MCV 96.2  PLT 169   Basic Metabolic Panel: Recent Labs  Lab 06/09/24 1358  NA 144  K 4.5  CL 106  CO2 27  GLUCOSE  103*  BUN 18  CREATININE 0.90  CALCIUM  9.4   GFR: Estimated Creatinine Clearance: 35.2 mL/min (by C-G formula based on SCr of 0.9 mg/dL). Liver Function Tests: Recent Labs  Lab 06/09/24 1358  AST 30  ALT 24  ALKPHOS 86  BILITOT 0.3  PROT 7.0  ALBUMIN  4.1   Coagulation Profile: Recent Labs  Lab 06/09/24 1401  INR 0.9    Radiological Exams on Admission: CT Hip Left Wo Contrast Result Date: 06/09/2024 CLINICAL DATA:  Trauma. Hip fracture suspected on current radiographs. EXAM: CT OF THE LEFT HIP WITHOUT CONTRAST TECHNIQUE: Multidetector CT imaging of the left hip was performed according to the standard protocol. Multiplanar CT image reconstructions were also generated. RADIATION DOSE REDUCTION: This exam was performed according to the departmental dose-optimization program which includes automated exposure control, adjustment of the mA and/or kV according to patient size and/or use of iterative reconstruction technique. COMPARISON:  Left hip radiographs, 06/09/2024. FINDINGS: Bones/Joint/Cartilage Acute comminuted and mildly displaced fracture of the proximal left femur. Fracture extends across the base of the neck, but also has an inter trochanteric component with fractures crossing the bases of the greater and lesser trochanters. Displacement of the primary fracture components of approximately 1 cm. There is also varus  and apex anterior angulation. There is also an irregular fracture of the left sacral ala of unclear chronicity. There is a lucent appearing lesion within the right sacral ala, as well as a similar area in the left sacral ala. Several lucencies are noted along the sacrum. Within the areas of lucency there is relative increased density, which is also apparent at the fracture site of the proximal left femur. Old healed fracture of the inferior left pubic ramus. Hip joint remains normally aligned. Ligaments Suboptimally assessed by CT. Muscles and Tendons No evidence of a muscle or tendon injury. There is edema within the soft tissues most evident posterior to the fractured proximal left femur. Soft tissues Soft tissue edema/hemorrhage most evident along the posterior margin of the fracture proximal left femur. IMPRESSION: 1. Comminuted, mildly displaced and angulated fracture proximal left femur fractures, intertrochanteric and at the base of the femoral neck. 2. Nondisplaced fracture of the left sacral ala of unclear chronicity. 3. Evidence of lucent bone lesions with lesions suggested at the proximal femur fracture and left sacral ala fracture. The proximal left femur fracture could be pathologic. Correlate with patient's history. Electronically Signed   By: Alm Parkins M.D.   On: 06/09/2024 17:13   CT Head Wo Contrast Result Date: 06/09/2024 EXAM: CT HEAD AND CERVICAL SPINE 06/09/2024 03:37:00 PM TECHNIQUE: CT of the head and cervical spine was performed without the administration of intravenous contrast. Multiplanar reformatted images are provided for review. Automated exposure control, iterative reconstruction, and/or weight based adjustment of the mA/kV was utilized to reduce the radiation dose to as low as reasonably achievable. COMPARISON: CT Head and CT Cervical Spine Feb 29, 2024 CLINICAL HISTORY: Head trauma, minor (Age >= 65y) FINDINGS: CT HEAD BRAIN AND VENTRICLES: No acute intracranial hemorrhage.  No mass effect or midline shift. No abnormal extra-axial fluid collection. No evidence of acute infarct. No hydrocephalus. Similar cerebral atrophy and chronic microvascular ischemic change. ORBITS: No acute abnormality. SINUSES AND MASTOIDS: No acute abnormality. SOFT TISSUES AND SKULL: No acute skull fracture. No acute soft tissue abnormality. CT CERVICAL SPINE BONES AND ALIGNMENT: No acute fracture or traumatic malalignment. DEGENERATIVE CHANGES: No significant degenerative changes. SOFT TISSUES: No prevertebral soft tissue swelling. LUNG APICES: Peripheral right upper  lobe consolidation with surrounding nodularity. IMPRESSION: 1. No acute intracranial abnormality. 2. No acute fracture or traumatic malalignment of the cervical spine. 3. Peripheral right upper lobe consolidation with surrounding nodularity, most likely infectious/inflammatory. Recommend follow up CT chest in 3 months to further evaluate. Electronically signed by: Gilmore Molt MD 06/09/2024 03:53 PM EST RP Workstation: [id number]   CT Cervical Spine Wo Contrast Result Date: 06/09/2024 EXAM: CT HEAD AND CERVICAL SPINE 06/09/2024 03:37:00 PM TECHNIQUE: CT of the head and cervical spine was performed without the administration of intravenous contrast. Multiplanar reformatted images are provided for review. Automated exposure control, iterative reconstruction, and/or weight based adjustment of the mA/kV was utilized to reduce the radiation dose to as low as reasonably achievable. COMPARISON: CT Head and CT Cervical Spine Feb 29, 2024 CLINICAL HISTORY: Head trauma, minor (Age >= 65y) FINDINGS: CT HEAD BRAIN AND VENTRICLES: No acute intracranial hemorrhage. No mass effect or midline shift. No abnormal extra-axial fluid collection. No evidence of acute infarct. No hydrocephalus. Similar cerebral atrophy and chronic microvascular ischemic change. ORBITS: No acute abnormality. SINUSES AND MASTOIDS: No acute abnormality. SOFT TISSUES AND SKULL: No  acute skull fracture. No acute soft tissue abnormality. CT CERVICAL SPINE BONES AND ALIGNMENT: No acute fracture or traumatic malalignment. DEGENERATIVE CHANGES: No significant degenerative changes. SOFT TISSUES: No prevertebral soft tissue swelling. LUNG APICES: Peripheral right upper lobe consolidation with surrounding nodularity. IMPRESSION: 1. No acute intracranial abnormality. 2. No acute fracture or traumatic malalignment of the cervical spine. 3. Peripheral right upper lobe consolidation with surrounding nodularity, most likely infectious/inflammatory. Recommend follow up CT chest in 3 months to further evaluate. Electronically signed by: Gilmore Molt MD 06/09/2024 03:53 PM EST RP Workstation: [id number]   DG Hip Unilat W or Wo Pelvis 2-3 Views Left Result Date: 06/09/2024 CLINICAL DATA:  Status post fall with shortened and externally rotated left leg EXAM: DG HIP (WITH OR WITHOUT PELVIS) 3V LEFT COMPARISON:  CT abdomen and pelvis dated 05/07/2024, pelvis radiograph dated 09/05/2023 FINDINGS: Surgical changes of proximal right femoral fixation. Old fracture deformity of the left inferior pubic ramus and left hemi sacrum. Irregular lucency through the intertrochanteric left femur. No acute hip dislocation. Degenerative changes of the bilateral hips. IMPRESSION: Irregular lucency through the intertrochanteric left femur, suspicious for acute fracture. Electronically Signed   By: Limin  Xu M.D.   On: 06/09/2024 14:33   DG Chest Port 1 View Result Date: 06/09/2024 CLINICAL DATA:  [facility] at a living facility. EXAM: PORTABLE CHEST 1 VIEW COMPARISON:  11/25/2023 and older studies. FINDINGS: Cardiac silhouette is normal in size. No mediastinal or hilar masses. Chronic scarring and interstitial thickening in the lungs. No evidence of pneumonia or pulmonary edema. No pleural effusion or pneumothorax. Skeletal structures are demineralized with no convincing acute fracture. IMPRESSION: No acute findings.  Electronically Signed   By: Alm Parkins M.D.   On: 06/09/2024 14:32   EKG: Independently reviewed.  Sinus rhythm rate 88, QTc 487.  No significant change from prior.  Assessment/Plan Principal Problem:   Closed left hip fracture (HCC) Active Problems:   Anxiety   Asthma    Assessment and Plan:  Closed left hip fracture-status post unwitnessed fall.  CT shows - comminuted, mildly displaced and angulated fracture proximal left femur fractures, intertrochanteric and at the base of the femoral neck.  Also nondisplaced fracture of left sacral ala-unclear chronicity.  And bone lesions in proximal femur and left sacral ala. - EDP talked to Dr. Beverley on-call, will admit to [name], for  surgical evaluation.  Family has opted for surgical evaluation/management.  Plan for surgery 11/29. - IV Dilaudid  0.5 every 4 hourly as needed - Appears quite dehydrated, 500 bolus, continue LR 75cc/hr x 15hrs  Possible pneumonia- no respiratory symptoms noted. WBC 11.1, tachycardic heart rate possibly from dehydration versus uncontrolled pain.  CT head/cervical spine-peripheral right upper lobe consolidation with surrounding nodularity, most likely infectious/inflammatory. - IV ceftriaxone  plus azithromycin   Asthma-stable.  Advanced dementia, anxiety, fall-nursing home resident, ambulates with wheelchair.  On Lexapro  and Ativan  at nursing home for agitation. - Resume Lexapro  - IV Ativan  0.5 Q8 hourly as needed for agitation   DVT prophylaxis:  heparin  Code Status:  DNR-confirmed with family, and the bedside DNR form at bedside. Family Communication: Daughter- Mikayla Wilson and her significant other at bedside Disposition Plan: ~ 2 days Consults called: Ortho- dr. Beverley Admission status: Inpt , tele  I certify that at the point of admission it is my clinical judgment that the patient will require inpatient hospital care spanning beyond 2 midnights from the point of admission due to high intensity of service,  high risk for further deterioration and high frequency of surveillance required.    Author: Tully FORBES Carwin, MD 06/09/2024 8:15 PM  For on call review [email or web address].

## 2024-06-10 DIAGNOSIS — S72002A Fracture of unspecified part of neck of left femur, initial encounter for closed fracture: Secondary | ICD-10-CM | POA: Diagnosis not present

## 2024-06-10 MED ORDER — TRANEXAMIC ACID-NACL 1000-0.7 MG/100ML-% IV SOLN
1000.0000 mg | INTRAVENOUS | Status: AC
  Administered 2024-06-11: 1000 mg via INTRAVENOUS

## 2024-06-10 MED ORDER — CEFAZOLIN SODIUM-DEXTROSE 2-4 GM/100ML-% IV SOLN
2.0000 g | INTRAVENOUS | Status: AC
  Administered 2024-06-11: 2 g via INTRAVENOUS

## 2024-06-10 NOTE — Care Plan (Signed)
 Discussed surgery vs non-operative management of L hip fracture with daughter Mikayla Wilson. Preference is for sugical management as she does independently transfer from bed to wheelchair. Plan for OR tomorrow, 11/29. NPO after midnight please, OK for diet today. Full consult note to follow

## 2024-06-10 NOTE — TOC CAGE-AID Note (Signed)
 Transition of Care [facility]) - CAGE-AID Screening   Patient Details  Name: Mikayla Wilson MRN: [id number] Date of Birth: [birth year]  [facility] CM/SW Contact:    Blondell Laperle E Miko Markwood, LCSW Phone Number: 06/10/2024, 9:18 AM   Clinical Narrative: Disoriented x 4.   CAGE-AID Screening: Substance Abuse Screening unable to be completed due to: : Patient unable to participate

## 2024-06-10 NOTE — Consult Note (Signed)
 ORTHOPAEDIC CONSULTATION  REQUESTING PHYSICIAN: Roann Gouty, MD  Chief Complaint: fall  HPI: Mikayla Wilson is a 89 y.o. female who complains of  left hip pain after a fall. Hx limited d/t dementia and no family present.  Imaging shows a  1. Comminuted, mildly displaced and angulated fracture proximal left femur fractures, intertrochanteric and at the base of the femoral neck. 2. Nondisplaced fracture of the left sacral ala of unclear chronicity. 3. Evidence of lucent bone lesions with lesions suggested at the proximal femur fracture and left sacral ala fracture. The proximal left femur fracture could be pathologic.    Orthopedics was consulted for evaluation.     Past Medical History:  Diagnosis Date   Allergy    Anxiety    Arthritis    Asthma    Cataract    Constipation    Coronary artery disease    Depression    Fibrocystic breast disease    GERD (gastroesophageal reflux disease)    Heart murmur    Hiatal hernia    Hiatal hernia    Osteoporosis    Past Surgical History:  Procedure Laterality Date   ABDOMINAL HYSTERECTOMY     CHOLECYSTECTOMY     CHOLECYSTECTOMY     EYE SURGERY     HIP SURGERY Right    INTRAMEDULLARY (IM) NAIL INTERTROCHANTERIC Right 11/30/2015   Procedure: INTRAMEDULLARY (IM) NAIL INTERTROCHANTERIC RIGHT HIP;  Surgeon: Lonni CINDERELLA Poli, MD;  Location: [facility];  Service: Orthopedics;  Laterality: Right;   Social History   Socioeconomic History   Marital status: Widowed    Spouse name: Not on file   Number of children: Not on file   Years of education: Not on file   Highest education level: Not on file  Occupational History   Not on file  Tobacco Use   Smoking status: Never   Smokeless tobacco: Current    Types: Snuff  Substance and Sexual Activity   Alcohol use: No   Drug use: No   Sexual activity: Never  Other Topics Concern   Not on file  Social History Narrative   Not on file   Social Drivers of Health    Financial Resource Strain: Not on file  Food Insecurity: Patient Unable To Answer (06/09/2024)   Hunger Vital Sign    Worried About Running Out of Food in the Last Year: Patient unable to answer    Ran Out of Food in the Last Year: Patient unable to answer  Transportation Needs: Patient Unable To Answer (06/09/2024)   PRAPARE - Transportation    Lack of Transportation (Medical): Patient unable to answer    Lack of Transportation (Non-Medical): Patient unable to answer  Physical Activity: Not on file  Stress: Not on file  Social Connections: Unknown (06/09/2024)   Social Connection and Isolation Panel    Frequency of Communication with Friends and Family: Patient unable to answer    Frequency of Social Gatherings with Friends and Family: Patient unable to answer    Attends Religious Services: Patient unable to answer    Active Member of Clubs or Organizations: Patient unable to answer    Attends Banker Meetings: Patient unable to answer    Marital Status: Widowed   Family History  Problem Relation Age of Onset   Arthritis Mother    Diabetes Mother    Heart disease Mother    Hypertension Mother    Miscarriages / Stillbirths Mother    Alcohol abuse Father  COPD Father    Cancer Sister        pancriatic cancer   Alcohol abuse Brother    Early death Brother    Heart disease Brother    Hypertension Brother    Allergies  Allergen Reactions   Aspirin Other (See Comments)    My doctor told me to never take aspirin   Morphine And Codeine Other (See Comments)    Unknown reaction   Prior to Admission medications   Medication Sig Start Date End Date Taking? Authorizing Provider  acetaminophen  (TYLENOL ) 500 MG tablet Take 2 tablets (1,000 mg total) by mouth every 8 (eight) hours. Patient taking differently: Take 500-1,000 mg by mouth See admin instructions. Give 2 tablets (1000mg ) by mouth three times daily. Also, give 1 tablet (500mg ) every 6 hours as needed for  pain. 09/08/23  Yes Tat, Alm, MD  Cholecalciferol (VITAMIN D3) 50 MCG (2000 UT) TABS Take 2,000 Units by mouth daily.   Yes [provider]  cyanocobalamin  (VITAMIN B12) 500 MCG tablet Take 1 tablet (500 mcg total) by mouth daily. 09/09/23  Yes Tat, Alm, MD  escitalopram  (LEXAPRO ) 10 MG tablet Take 10 mg by mouth daily.   Yes [provider]  famotidine  (PEPCID ) 20 MG tablet Take 1 tablet (20 mg total) by mouth 2 (two) times daily. 06/10/23  Yes Dettinger, Fonda LABOR, MD  ferrous sulfate 325 (65 FE) MG tablet Take 325 mg by mouth daily.   Yes [provider]  ibuprofen  (ADVIL ) 200 MG tablet Take 400 mg by mouth every 8 (eight) hours as needed (pain).   Yes [provider]  lactose free nutrition (BOOST) LIQD Take 237 mLs by mouth with breakfast, with lunch, and with evening meal.   Yes [provider]  loratadine  (EQ LORATADINE ) 10 MG tablet Take 1 tablet (10 mg total) by mouth daily. TAKE 1 TABLET BY MOUTH ONCE DAILY AS NEEDED FOR ALLERGIES 06/10/23  Yes Dettinger, Fonda LABOR, MD  LORazepam  (ATIVAN ) 0.5 MG tablet Take 0.5 mg by mouth daily in the afternoon.   Yes [provider]  LORazepam  (ATIVAN ) 1 MG tablet Take 1 tablet (1 mg total) by mouth every morning. Patient taking differently: Take 1 mg by mouth 2 (two) times daily. 09/09/23  Yes Tat, Alm, MD  Nutritional Supplements (NUTRITIONAL DRINK) LIQD Take 1 Dose by mouth daily in the afternoon. Magic Cup (90cc free water in 120cc) in the afternoon for to enhance kcals and protein in diet w/lunch tray.   Yes [provider]  pantoprazole  (PROTONIX ) 40 MG tablet Take 40 mg by mouth in the morning and at bedtime.   Yes [provider]  Polyethyl Glycol-Propyl Glycol (SYSTANE) 0.4-0.3 % GEL ophthalmic gel Place 1 Application into both eyes in the morning and at bedtime.   Yes [provider]  potassium chloride  (KLOR-CON  M) 10 MEQ tablet Take 1 tablet (10 mEq total) by  mouth daily. 06/10/23  Yes Dettinger, Fonda LABOR, MD  rosuvastatin  (CRESTOR ) 5 MG tablet Take 1 tablet (5 mg total) by mouth daily. Patient taking differently: Take 5 mg by mouth every evening. 06/10/23  Yes Dettinger, Fonda LABOR, MD  traZODone  (DESYREL ) 50 MG tablet Take 50 mg by mouth at bedtime.   Yes [provider]  sucralfate (CARAFATE) 1 g tablet Take 1 g by mouth 3 (three) times daily before meals. Patient not taking: Reported on 06/10/2024    [provider]   CT Hip Left Wo Contrast Result Date: 06/09/2024  CLINICAL DATA:  Trauma. Hip fracture suspected on current radiographs. EXAM: CT OF THE LEFT HIP WITHOUT CONTRAST TECHNIQUE: Multidetector CT imaging of the left hip was performed according to the standard protocol. Multiplanar CT image reconstructions were also generated. RADIATION DOSE REDUCTION: This exam was performed according to the departmental dose-optimization program which includes automated exposure control, adjustment of the mA and/or kV according to patient size and/or use of iterative reconstruction technique. COMPARISON:  Left hip radiographs, 06/09/2024. FINDINGS: Bones/Joint/Cartilage Acute comminuted and mildly displaced fracture of the proximal left femur. Fracture extends across the base of the neck, but also has an inter trochanteric component with fractures crossing the bases of the greater and lesser trochanters. Displacement of the primary fracture components of approximately 1 cm. There is also varus and apex anterior angulation. There is also an irregular fracture of the left sacral ala of unclear chronicity. There is a lucent appearing lesion within the right sacral ala, as well as a similar area in the left sacral ala. Several lucencies are noted along the sacrum. Within the areas of lucency there is relative increased density, which is also apparent at the fracture site of the proximal left femur. Old healed fracture of the inferior left pubic ramus. Hip  joint remains normally aligned. Ligaments Suboptimally assessed by CT. Muscles and Tendons No evidence of a muscle or tendon injury. There is edema within the soft tissues most evident posterior to the fractured proximal left femur. Soft tissues Soft tissue edema/hemorrhage most evident along the posterior margin of the fracture proximal left femur. IMPRESSION: 1. Comminuted, mildly displaced and angulated fracture proximal left femur fractures, intertrochanteric and at the base of the femoral neck. 2. Nondisplaced fracture of the left sacral ala of unclear chronicity. 3. Evidence of lucent bone lesions with lesions suggested at the proximal femur fracture and left sacral ala fracture. The proximal left femur fracture could be pathologic. Correlate with patient's history. Electronically Signed   By: Alm Parkins M.D.   On: 06/09/2024 17:13   CT Head Wo Contrast Result Date: 06/09/2024 EXAM: CT HEAD AND CERVICAL SPINE 06/09/2024 03:37:00 PM TECHNIQUE: CT of the head and cervical spine was performed without the administration of intravenous contrast. Multiplanar reformatted images are provided for review. Automated exposure control, iterative reconstruction, and/or weight based adjustment of the mA/kV was utilized to reduce the radiation dose to as low as reasonably achievable. COMPARISON: CT Head and CT Cervical Spine Feb 29, 2024 CLINICAL HISTORY: Head trauma, minor (Age >= 65y) FINDINGS: CT HEAD BRAIN AND VENTRICLES: No acute intracranial hemorrhage. No mass effect or midline shift. No abnormal extra-axial fluid collection. No evidence of acute infarct. No hydrocephalus. Similar cerebral atrophy and chronic microvascular ischemic change. ORBITS: No acute abnormality. SINUSES AND MASTOIDS: No acute abnormality. SOFT TISSUES AND SKULL: No acute skull fracture. No acute soft tissue abnormality. CT CERVICAL SPINE BONES AND ALIGNMENT: No acute fracture or traumatic malalignment. DEGENERATIVE CHANGES: No significant  degenerative changes. SOFT TISSUES: No prevertebral soft tissue swelling. LUNG APICES: Peripheral right upper lobe consolidation with surrounding nodularity. IMPRESSION: 1. No acute intracranial abnormality. 2. No acute fracture or traumatic malalignment of the cervical spine. 3. Peripheral right upper lobe consolidation with surrounding nodularity, most likely infectious/inflammatory. Recommend follow up CT chest in 3 months to further evaluate. Electronically signed by: Gilmore Molt MD 06/09/2024 03:53 PM EST RP Workstation: [id number]   CT Cervical Spine Wo Contrast Result Date: 06/09/2024 EXAM: CT HEAD AND CERVICAL SPINE 06/09/2024 03:37:00 PM TECHNIQUE: CT of the head  and cervical spine was performed without the administration of intravenous contrast. Multiplanar reformatted images are provided for review. Automated exposure control, iterative reconstruction, and/or weight based adjustment of the mA/kV was utilized to reduce the radiation dose to as low as reasonably achievable. COMPARISON: CT Head and CT Cervical Spine Feb 29, 2024 CLINICAL HISTORY: Head trauma, minor (Age >= 65y) FINDINGS: CT HEAD BRAIN AND VENTRICLES: No acute intracranial hemorrhage. No mass effect or midline shift. No abnormal extra-axial fluid collection. No evidence of acute infarct. No hydrocephalus. Similar cerebral atrophy and chronic microvascular ischemic change. ORBITS: No acute abnormality. SINUSES AND MASTOIDS: No acute abnormality. SOFT TISSUES AND SKULL: No acute skull fracture. No acute soft tissue abnormality. CT CERVICAL SPINE BONES AND ALIGNMENT: No acute fracture or traumatic malalignment. DEGENERATIVE CHANGES: No significant degenerative changes. SOFT TISSUES: No prevertebral soft tissue swelling. LUNG APICES: Peripheral right upper lobe consolidation with surrounding nodularity. IMPRESSION: 1. No acute intracranial abnormality. 2. No acute fracture or traumatic malalignment of the cervical spine. 3. Peripheral  right upper lobe consolidation with surrounding nodularity, most likely infectious/inflammatory. Recommend follow up CT chest in 3 months to further evaluate. Electronically signed by: Gilmore Molt MD 06/09/2024 03:53 PM EST RP Workstation: [id number]   DG Hip Unilat W or Wo Pelvis 2-3 Views Left Result Date: 06/09/2024 CLINICAL DATA:  Status post fall with shortened and externally rotated left leg EXAM: DG HIP (WITH OR WITHOUT PELVIS) 3V LEFT COMPARISON:  CT abdomen and pelvis dated 05/07/2024, pelvis radiograph dated 09/05/2023 FINDINGS: Surgical changes of proximal right femoral fixation. Old fracture deformity of the left inferior pubic ramus and left hemi sacrum. Irregular lucency through the intertrochanteric left femur. No acute hip dislocation. Degenerative changes of the bilateral hips. IMPRESSION: Irregular lucency through the intertrochanteric left femur, suspicious for acute fracture. Electronically Signed   By: Limin  Xu M.D.   On: 06/09/2024 14:33   DG Chest Port 1 View Result Date: 06/09/2024 CLINICAL DATA:  [facility] at a living facility. EXAM: PORTABLE CHEST 1 VIEW COMPARISON:  11/25/2023 and older studies. FINDINGS: Cardiac silhouette is normal in size. No mediastinal or hilar masses. Chronic scarring and interstitial thickening in the lungs. No evidence of pneumonia or pulmonary edema. No pleural effusion or pneumothorax. Skeletal structures are demineralized with no convincing acute fracture. IMPRESSION: No acute findings. Electronically Signed   By: Alm Parkins M.D.   On: 06/09/2024 14:32    Positive ROS: All other systems have been reviewed and were otherwise negative with the exception of those mentioned in the HPI and as above.  Objective: Labs cbc Recent Labs    06/09/24 1401  WBC 11.1*  HGB 12.3  HCT 37.5  PLT 169    Labs inflam No results for input(s): CRP in the last 72 hours.  Invalid input(s): ESR  Labs coag Recent Labs    06/09/24 1401  INR 0.9     Recent Labs    06/09/24 1358  NA 144  K 4.5  CL 106  CO2 27  GLUCOSE 103*  BUN 18  CREATININE 0.90  CALCIUM  9.4    Physical Exam: Vitals:   06/10/24 0348 06/10/24 0833  BP: (!) 161/76 138/64  Pulse: 89 90  Resp:  15  Temp: 98.2 F (36.8 C)   SpO2: 97% 100%   General: Sleeping comfortably in well Mental status: at baseline Neurologic: confused Respiratory: No cyanosis, no use of accessory musculature Cardiovascular: No pedal edema GI: Abdomen is soft and non-tender, non-distended. Skin: Warm and dry.  No lesions in the area of chief complaint . Extremities: Warm and well perfused w/o edema Psychiatric: pleasantly confused  MUSCULOSKELETAL:  L hip TTP, painful ROM, NVI  Other extremities are atraumatic with painless ROM and NVI.  Assessment / Plan: Principal Problem:   Closed left hip fracture (HCC) Active Problems:   Anxiety   Asthma    Recommend L hip IMN placement tomorrow with Dr. Germaine.   NPO at midnight order in. Bed rest until surgery. Minimize narcotics due to age and dementia. Recommend non-narcotics like Tylenol .    Gerard CHRISTELLA Large PA-C Office [phone] 06/10/2024 10:43 AM

## 2024-06-10 NOTE — Hospital Course (Addendum)
 89 y.o. female with medical history significant for dementia, asthma, coronary artery disease. Patient was brought to the ED reports of an unwitnessed fall.  CT left hip showed comminuted mildly displaced and angulated fracture of the left femur.  Nondisplaced fracture of the left sacral ala of unclear chronicity. She underwent cephalomedullary nail fixation of left intertrochanteric femur fracture on 06/11/2024.   Assessment & Plan:   Closed left hip fracture -Status post unwitnessed fall. -CT: comminuted, mildly displaced and angulated fracture proximal left femur fractures, intertrochanteric and at the base of the femoral neck. - Nondisplaced fracture of left sacral ala-unclear chronicity. - Bone lesions in proximal femur and left sacral ala. - S/P Left intertrochanteric fracture fixation with IM nail 11/29 - PT/OT, SNF recommended - WBAT with walker  - unclear ASA allergy, so will d/c with Lovenox  daily for ongoing DVT PPX course  Advanced dementia -Nursing home resident -On Lexapro  and Ativan  at nursing home for agitation - agree that PEG not recommended with her FTT and poor intake in setting of advanced dementia and advanced age - PCM spoke with daughter; not ready for much GOC talks at this time and family believes patient eating enough for now - would benefit from [employer] following at discharge at [facility] but sounds like family not ready; if patient further deteriorates due to poor intake after discharge, palliative care can be reconsulted at that time   Postop anemia due to acute blood loss - stable Hgb post op    Possible bacterial community acquired pneumonia -No respiratory symptoms noted. -WBC 11.1, tachycardic heart rate possibly from dehydration versus uncontrolled pain -CT head/cervical spine-peripheral right upper lobe consolidation with surrounding nodularity, most likely infectious/inflammatory - completed rocephin  and azithro x 5 days   Hypokalemia - Repleted    Asthma-stable

## 2024-06-10 NOTE — Progress Notes (Signed)
 Initial Nutrition Assessment  DOCUMENTATION CODES:   Non-severe (moderate) malnutrition in context of social or environmental circumstances  INTERVENTION:  NPO, sips with meds Advance diet as tolerated as mentation improves Ensure Plus High Protein po BID once started on PO diet If unsafe for PO diet, recommend placing NGT/cortrak and starting TF: Osmolite 1.2 @ 55 ml/hr, total volume  1320 ml Provides 1584 kcals, 73 g protein and 1082 ml free water     NUTRITION DIAGNOSIS:   Moderate Malnutrition related to social / environmental circumstances as evidenced by severe muscle depletion, moderate fat depletion.   GOAL:   Patient will meet greater than or equal to 90% of their needs   MONITOR:   PO intake, Diet advancement, Supplement acceptance, Labs  REASON FOR ASSESSMENT:   Consult Assessment of nutrition requirement/status (Hip/Femur fracture patient)  ASSESSMENT:   Past medical history significant for dementia, asthma, coronary artery disease presents after unwitnessed fall.   Patient seen in room, sleeping. Difficulty to arouse, yelling/moaning out before going back to sleep. Pt with advanced dementia, came from long term care facility. MD came in during visit, requests pt be made NPO due to her being very lethargic/minimally alert. Potential plan for OR tomorrow for left hip fracture.   Admit weight: 59.9 kg - unsure of accuracy, possible copied forward from previous encounter  Current weight: 56.9 kg     Wt Readings from Last 10 Encounters:  06/10/24 56.9 kg  05/07/24 59.9 kg  02/29/24 59.9 kg  12/04/23 59.9 kg  11/25/23 59.9 kg  09/02/23 56.7 kg  06/10/23 56.7 kg  03/09/23 58.1 kg  10/31/22 62.1 kg  08/01/22 64.9 kg    Nutritionally Relevant Medications: Reviewed.   Labs Reviewed: Glu 103   NUTRITION - FOCUSED PHYSICAL EXAM:  Flowsheet Row Most Recent Value  Orbital Region Moderate depletion  Upper Arm Region Severe depletion  Thoracic and Lumbar  Region Moderate depletion  Buccal Region Moderate depletion  Temple Region Moderate depletion  Clavicle Bone Region Moderate depletion  Clavicle and Acromion Bone Region Severe depletion  Scapular Bone Region Moderate depletion  Dorsal Hand Severe depletion  Patellar Region Mild depletion  Anterior Thigh Region Mild depletion  Posterior Calf Region Mild depletion  Hair Reviewed  Eyes Unable to assess  Mouth Unable to assess  Skin Reviewed  Nails Reviewed    Diet Order:   Diet Order             Diet NPO time specified Except for: Sips with Meds  Diet effective now                   EDUCATION NEEDS:   Not appropriate for education at this time  Skin:  Skin Assessment: Reviewed RN Assessment  Last BM:  PTA  Height:   Ht Readings from Last 1 Encounters:  06/09/24 5' 4 (1.626 m)    Weight:   Wt Readings from Last 1 Encounters:  06/10/24 56.9 kg    BMI:  Body mass index is 21.53 kg/m.  Estimated Nutritional Needs:   Kcal:  1500-1700  Protein:  70-90 g  Fluid:  1.5-1.7L/d  Madalyn Potters, MS, RD, LDN Clinical Dietitian  Contact via secure chat. If unavailable, use group chat RD Inpatient.

## 2024-06-10 NOTE — Plan of Care (Signed)
  Problem: Health Behavior/Discharge Planning: Goal: Ability to manage health-related needs will improve Outcome: Progressing   Problem: Clinical Measurements: Goal: Respiratory complications will improve Outcome: Progressing   Problem: Activity: Goal: Risk for activity intolerance will decrease Outcome: Progressing   

## 2024-06-10 NOTE — Progress Notes (Signed)
 Progress Note   Patient: Mikayla Wilson FMW:[id number] DOB: [birth year] DOA: 06/09/2024     1 DOS: the patient was seen and examined on 06/10/2024   Brief hospital course: HPI: Mikayla Wilson is a 89 y.o. female with medical history significant for dementia, asthma, coronary artery disease. Patient was brought to the ED reports of an unwitnessed fall.  Patient's ambulates with a wheelchair at baseline.  Patient was found at the head of the hallway from her room, in another patient's room, on the floor, with her wheelchair 2 feet away from her.  Patient has advanced dementia, does not recognize family. Patient's daughter [name] is at bedside.  Patient has not had a cough or difficulty breathing.   My evaluation, patient is awake, yelling out loud, incomprehensible.   ED Course: Temperature 98.7.  Heart rate 96-118.  Respirate rate 15-30.  Blood pressure systolic 121-144.  O2 sat greater 93% on room air. WBC 11.1. Chest x-ray negative for acute abnormality. Head and cervical CT-no acute intracranial abnormality, right upper lobe consolidation with surrounding nodularity, most likely infectious/inflammatory. Left hip CT-comminuted, mildly displaced and angulated fracture proximal left femur fractures,intertrochanteric and at the base of the femoral neck. IV Ativan  0.5 x 2 given.  IV Fentanyl  12.5 x 2 given. EDP consulted Dr. Beverley on-call for Ortho, recommended admission to [name], family opting for surgical evaluation, than nonsurgical management.  Assessment and Plan:  89 year old female with significant medical history of dementia, asthma, CAD who was brought in for an unwitnessed fall leading to mildly displaced and angulated fracture of left femur.   Closed left hip fracture -Status post unwitnessed fall. - CT shows - comminuted, mildly displaced and angulated fracture proximal left femur fractures, intertrochanteric and at the base of the femoral neck. - Nondisplaced fracture of  left sacral ala-unclear chronicity. - Bone lesions in proximal femur and left sacral ala. - Orthopedic team evaluated the patient, plan for surgery 11/29. - N.p.o. past midnight - IV Dilaudid  0.5 every 4 hourly as needed - Appears quite dehydrated, 500 bolus, continue LR 75cc/hr x 15hrs   Possible pneumonia -No respiratory symptoms noted. -WBC 11.1, tachycardic heart rate possibly from dehydration versus uncontrolled pain. -CT head/cervical spine-peripheral right upper lobe consolidation with surrounding nodularity, most likely infectious/inflammatory. - IV ceftriaxone  plus azithromycin    Asthma-stable.   Advanced dementia, anxiety, fall -Nursing home resident, ambulates with wheelchair. -On Lexapro  and Ativan  at nursing home for agitation. -Resume Lexapro  -IV Ativan  0.5 Q8 hourly as needed for agitation     Subjective: Patient was not able to provide any meaningful history due to her dementia  Physical Exam: Vitals:   06/09/24 2331 06/10/24 0348 06/10/24 0833 06/10/24 0900  BP: 117/69 (!) 161/76 138/64   Pulse: 92 89 90   Resp:   15   Temp: (!) 97.5 F (36.4 C) 98.2 F (36.8 C)    TempSrc: Oral     SpO2: 100% 97% 100%   Weight:    56.9 kg  Height:       Constitutional: Alert, awake, calm, comfortable HEENT: Neck supple Respiratory: clear to auscultation bilaterally, no wheezing, no crackles. Normal respiratory effort. No accessory muscle use.  Cardiovascular: Regular rate and rhythm, no murmurs / rubs / gallops. No extremity edema. 2+ pedal pulses. No carotid bruits.  Abdomen: no tenderness, no masses palpated. No hepatosplenomegaly. Bowel sounds positive.  Musculoskeletal: Left hip tenderness Skin: no rashes, lesions, ulcers. No induration Neurologic: He is alert awake and confused  Data Reviewed:  Reviewed  Family Communication: None available  Disposition: Status is: Inpatient Remains inpatient appropriate because: Ongoing recovery from left hip fracture  requiring surgical procedure  Planned Discharge Destination: Skilled nursing facility    Time spent: 35 minutes  Author: Nena Rebel, MD 06/10/2024 3:06 PM  For on call review [email or web address].

## 2024-06-11 ENCOUNTER — Encounter (HOSPITAL_COMMUNITY): Payer: Self-pay | Admitting: Internal Medicine

## 2024-06-11 ENCOUNTER — Encounter (HOSPITAL_COMMUNITY)
Admission: EM | Disposition: A | Discharge status: Skilled Nursing Facility | Payer: Self-pay | Source: Skilled Nursing Facility | Attending: Hospitalist

## 2024-06-11 ENCOUNTER — Inpatient Hospital Stay (HOSPITAL_COMMUNITY)

## 2024-06-11 ENCOUNTER — Inpatient Hospital Stay (HOSPITAL_COMMUNITY): Admitting: Certified Registered"

## 2024-06-11 DIAGNOSIS — S72142A Displaced intertrochanteric fracture of left femur, initial encounter for closed fracture: Secondary | ICD-10-CM | POA: Diagnosis not present

## 2024-06-11 DIAGNOSIS — I2510 Atherosclerotic heart disease of native coronary artery without angina pectoris: Secondary | ICD-10-CM | POA: Diagnosis not present

## 2024-06-11 DIAGNOSIS — F418 Other specified anxiety disorders: Secondary | ICD-10-CM | POA: Diagnosis not present

## 2024-06-11 DIAGNOSIS — J45909 Unspecified asthma, uncomplicated: Secondary | ICD-10-CM

## 2024-06-11 DIAGNOSIS — S72002A Fracture of unspecified part of neck of left femur, initial encounter for closed fracture: Secondary | ICD-10-CM | POA: Diagnosis not present

## 2024-06-11 DIAGNOSIS — E440 Moderate protein-calorie malnutrition: Secondary | ICD-10-CM | POA: Insufficient documentation

## 2024-06-11 HISTORY — PX: INTRAMEDULLARY (IM) NAIL INTERTROCHANTERIC: SHX5875

## 2024-06-11 LAB — SURGICAL PCR SCREEN
MRSA, PCR: POSITIVE — AB
Staphylococcus aureus: POSITIVE — AB

## 2024-06-11 SURGERY — FIXATION, FRACTURE, INTERTROCHANTERIC, WITH INTRAMEDULLARY ROD
Anesthesia: General | Laterality: Left

## 2024-06-11 MED ORDER — ROCURONIUM BROMIDE 100 MG/10ML IV SOLN
INTRAVENOUS | Status: DC | PRN
  Administered 2024-06-11: 20 mg via INTRAVENOUS
  Administered 2024-06-11: 40 mg via INTRAVENOUS

## 2024-06-11 MED ORDER — MUPIROCIN 2 % EX OINT
1.0000 | TOPICAL_OINTMENT | Freq: Two times a day (BID) | CUTANEOUS | Status: DC
  Administered 2024-06-11 – 2024-06-14 (×8): 1 via NASAL
  Filled 2024-06-11: qty 22

## 2024-06-11 MED ORDER — ORAL CARE MOUTH RINSE: 15.0000 mL | Freq: Once | OROMUCOSAL | Status: AC

## 2024-06-11 MED ORDER — VASOPRESSIN 20 UNIT/ML IV SOLN
INTRAVENOUS | Status: DC | PRN
  Administered 2024-06-11: 1.5 [IU] via INTRAVENOUS
  Administered 2024-06-11 (×5): .5 [IU] via INTRAVENOUS

## 2024-06-11 MED ORDER — FENTANYL CITRATE (PF) 100 MCG/2ML IJ SOLN
INTRAMUSCULAR | Status: AC
  Filled 2024-06-11: qty 2

## 2024-06-11 MED ORDER — NOREPINEPHRINE 4 MG/250ML-% IV SOLN
INTRAVENOUS | Status: DC | PRN
  Administered 2024-06-11: 2 ug/min via INTRAVENOUS

## 2024-06-11 MED ORDER — ALBUMIN HUMAN 5 % IV SOLN: INTRAVENOUS | Status: DC | PRN

## 2024-06-11 MED ORDER — PROPOFOL 10 MG/ML IV BOLUS
INTRAVENOUS | Status: DC | PRN
  Administered 2024-06-11: 20 mg via INTRAVENOUS
  Administered 2024-06-11: 50 mg via INTRAVENOUS
  Administered 2024-06-11: 10 mg via INTRAVENOUS

## 2024-06-11 MED ORDER — VASOPRESSIN 20 UNIT/ML IV SOLN
INTRAVENOUS | Status: AC
  Filled 2024-06-11: qty 1

## 2024-06-11 MED ORDER — NOREPINEPHRINE 4 MG/250ML-% IV SOLN
INTRAVENOUS | Status: AC
  Filled 2024-06-11: qty 250

## 2024-06-11 MED ORDER — PHENYLEPHRINE HCL-NACL 20-0.9 MG/250ML-% IV SOLN
INTRAVENOUS | Status: DC | PRN
  Administered 2024-06-11: 20 ug/min via INTRAVENOUS

## 2024-06-11 MED ORDER — ACETAMINOPHEN 10 MG/ML IV SOLN: 1000.0000 mg | Freq: Once | INTRAVENOUS | Status: DC | PRN

## 2024-06-11 MED ORDER — CEFAZOLIN SODIUM-DEXTROSE 1-4 GM/50ML-% IV SOLN
1.0000 g | Freq: Three times a day (TID) | INTRAVENOUS | Status: AC
  Administered 2024-06-11 (×2): 1 g via INTRAVENOUS
  Filled 2024-06-11 (×2): qty 50

## 2024-06-11 MED ORDER — PROPOFOL 10 MG/ML IV BOLUS
INTRAVENOUS | Status: AC
  Filled 2024-06-11: qty 20

## 2024-06-11 MED ORDER — LIDOCAINE HCL (PF) 2 % IJ SOLN
INTRAMUSCULAR | Status: DC | PRN
  Administered 2024-06-11: 25 mg via INTRADERMAL

## 2024-06-11 MED ORDER — VANCOMYCIN HCL 1000 MG IV SOLR
INTRAVENOUS | Status: AC
  Filled 2024-06-11: qty 20

## 2024-06-11 MED ORDER — FENTANYL CITRATE (PF) 100 MCG/2ML IJ SOLN
25.0000 ug | INTRAMUSCULAR | Status: DC | PRN
  Administered 2024-06-11 (×2): 25 ug via INTRAVENOUS
  Administered 2024-06-11: 50 ug via INTRAVENOUS

## 2024-06-11 MED ORDER — 0.9 % SODIUM CHLORIDE (POUR BTL) OPTIME
TOPICAL | Status: DC | PRN
  Administered 2024-06-11: 1000 mL

## 2024-06-11 MED ORDER — STERILE WATER FOR IRRIGATION IR SOLN
Status: DC | PRN
  Administered 2024-06-11: 1000 mL

## 2024-06-11 MED ORDER — CHLORHEXIDINE GLUCONATE CLOTH 2 % EX PADS
6.0000 | MEDICATED_PAD | Freq: Every day | CUTANEOUS | Status: DC
  Administered 2024-06-11 – 2024-06-13 (×3): 6 via TOPICAL

## 2024-06-11 MED ORDER — SUGAMMADEX SODIUM 200 MG/2ML IV SOLN
INTRAVENOUS | Status: DC | PRN
  Administered 2024-06-11 (×2): 100 mg via INTRAVENOUS

## 2024-06-11 MED ORDER — PHENYLEPHRINE HCL-NACL 20-0.9 MG/250ML-% IV SOLN
INTRAVENOUS | Status: AC
  Filled 2024-06-11: qty 250

## 2024-06-11 MED ORDER — LACTATED RINGERS IV SOLN: INTRAVENOUS | Status: DC

## 2024-06-11 MED ORDER — PROPOFOL 1000 MG/100ML IV EMUL
INTRAVENOUS | Status: AC
  Filled 2024-06-11: qty 100

## 2024-06-11 MED ORDER — TRANEXAMIC ACID-NACL 1000-0.7 MG/100ML-% IV SOLN
INTRAVENOUS | Status: AC
  Filled 2024-06-11: qty 100

## 2024-06-11 MED ORDER — DEXAMETHASONE SODIUM PHOSPHATE 4 MG/ML IJ SOLN
INTRAMUSCULAR | Status: DC | PRN
  Administered 2024-06-11: 4 mg via INTRAVENOUS

## 2024-06-11 MED ORDER — PHENYLEPHRINE HCL (PRESSORS) 10 MG/ML IV SOLN
INTRAVENOUS | Status: DC | PRN
  Administered 2024-06-11: 80 ug via INTRAVENOUS
  Administered 2024-06-11 (×2): 120 ug via INTRAVENOUS
  Administered 2024-06-11: 80 ug via INTRAVENOUS

## 2024-06-11 MED ORDER — CHLORHEXIDINE GLUCONATE 0.12 % MT SOLN
15.0000 mL | Freq: Once | OROMUCOSAL | Status: AC
  Administered 2024-06-11: 15 mL via OROMUCOSAL

## 2024-06-11 SURGICAL SUPPLY — 33 items
BIT DRILL INTERTAN LAG SCREW (BIT) IMPLANT
BIT DRILL LONG 4.0 (BIT) IMPLANT
BRUSH SCRUB EZ PLAIN DRY (MISCELLANEOUS) ×4 IMPLANT
CHLORAPREP W/TINT 26 (MISCELLANEOUS) ×2 IMPLANT
CLSR STERI-STRIP ANTIMIC 1/2X4 (GAUZE/BANDAGES/DRESSINGS) IMPLANT
COVER PERINEAL POST (MISCELLANEOUS) ×2 IMPLANT
COVER SURGICAL LIGHT HANDLE (MISCELLANEOUS) ×2 IMPLANT
DERMABOND ADVANCED .7 DNX12 (GAUZE/BANDAGES/DRESSINGS) ×2 IMPLANT
DRAPE IMP U-DRAPE 54X76 (DRAPES) ×4 IMPLANT
DRAPE INCISE IOBAN 66X45 STRL (DRAPES) ×2 IMPLANT
DRAPE STERI IOBAN 125X83 (DRAPES) ×2 IMPLANT
DRESSING MEPILEX FLEX 4X4 (GAUZE/BANDAGES/DRESSINGS) ×2 IMPLANT
DRSG AQUACEL AG ADV 3.5X 4 (GAUZE/BANDAGES/DRESSINGS) IMPLANT
DRSG MEPILEX POST OP 4X8 (GAUZE/BANDAGES/DRESSINGS) ×2 IMPLANT
ELECTRODE REM PT RTRN 9FT ADLT (ELECTROSURGICAL) ×2 IMPLANT
GLOVE BIO SURGEON STRL SZ7.5 (GLOVE) ×8 IMPLANT
GLOVE BIOGEL PI IND STRL 7.5 (GLOVE) ×2 IMPLANT
GOWN STRL REUS W/ TWL LRG LVL3 (GOWN DISPOSABLE) ×2 IMPLANT
KIT BASIN OR (CUSTOM PROCEDURE TRAY) ×2 IMPLANT
KIT TURNOVER KIT B (KITS) ×2 IMPLANT
NAIL TRIGEN INTERTAN 10X18CM (Nail) IMPLANT
PACK GENERAL/GYN (CUSTOM PROCEDURE TRAY) ×2 IMPLANT
PAD ARMBOARD POSITIONER FOAM (MISCELLANEOUS) ×4 IMPLANT
PIN GUIDE 3.2X343MM (PIN) IMPLANT
SCREW CANC 11X90 CANN LH (Screw) IMPLANT
SCREW LAG SUBTROC 11X100 (Screw) IMPLANT
SCREW TRIGEN LOW PROF 5.0X27.5 (Screw) IMPLANT
SOLN 0.9% NACL POUR BTL 1000ML (IV SOLUTION) ×2 IMPLANT
SOLN STERILE WATER BTL 1000 ML (IV SOLUTION) ×2 IMPLANT
SUT MNCRL AB 3-0 PS2 18 (SUTURE) ×2 IMPLANT
SUT VIC AB 0 CT1 27XBRD ANBCTR (SUTURE) IMPLANT
SUT VIC AB 2-0 CT1 TAPERPNT 27 (SUTURE) ×4 IMPLANT
TOWEL GREEN STERILE (TOWEL DISPOSABLE) ×4 IMPLANT

## 2024-06-11 NOTE — Anesthesia Procedure Notes (Signed)
 Procedure Name: Intubation Date/Time: 06/11/2024 7:39 AM  Performed by: Rhodia Debby MATSU, CRNAPre-anesthesia Checklist: Patient identified, Emergency Drugs available, Suction available, Timeout performed and Patient being monitored Patient Re-evaluated:Patient Re-evaluated prior to induction Oxygen Delivery Method: Circle system utilized Preoxygenation: Pre-oxygenation with 100% oxygen Induction Type: IV induction Ventilation: Mask ventilation without difficulty Laryngoscope Size: Miller and 2 Grade View: Grade I Tube type: Oral Tube size: 6.5 mm Number of attempts: 1 Airway Equipment and Method: Stylet Placement Confirmation: ETT inserted through vocal cords under direct vision and breath sounds checked- equal and bilateral Secured at: 21 cm Tube secured with: Tape Dental Injury: Teeth and Oropharynx as per pre-operative assessment  Comments: Brief atraumatic dentition unchanged

## 2024-06-11 NOTE — Brief Op Note (Signed)
 06/09/2024 - 06/11/2024  9:10 AM  PATIENT:  Mikayla Wilson  89 y.o. female  PRE-OPERATIVE DIAGNOSIS:  left hip fracture  POST-OPERATIVE DIAGNOSIS:  * No post-op diagnosis entered *  PROCEDURE:  Procedure(s): FIXATION, FRACTURE, INTERTROCHANTERIC, WITH INTRAMEDULLARY ROD (Left)  SURGEON:  Surgeons and Role:    DEWAINE Germaine Redbird, MD - Primary  PHYSICIAN ASSISTANT:   ASSISTANTS: none   ANESTHESIA:   general  EBL:  100 mL   BLOOD ADMINISTERED:none  DRAINS: none   LOCAL MEDICATIONS USED:  NONE  SPECIMEN:  No Specimen  DISPOSITION OF SPECIMEN:  N/A  COUNTS:  YES  TOURNIQUET:  * No tourniquets in log *  DICTATION: .Dragon Dictation  PLAN OF CARE: Admit to inpatient   PATIENT DISPOSITION:  PACU - hemodynamically stable.   Delay start of Pharmacological VTE agent (>24hrs) due to surgical blood loss or risk of bleeding: no

## 2024-06-11 NOTE — Anesthesia Postprocedure Evaluation (Signed)
 Anesthesia Post Note  Patient: Mikayla Wilson  Procedure(s) Performed: FIXATION, FRACTURE, INTERTROCHANTERIC, WITH INTRAMEDULLARY ROD (Left)     Patient location during evaluation: PACU Anesthesia Type: General Level of consciousness: awake and alert Pain management: pain level controlled Vital Signs Assessment: post-procedure vital signs reviewed and stable Respiratory status: spontaneous breathing, nonlabored ventilation, respiratory function stable and patient connected to nasal cannula oxygen Cardiovascular status: blood pressure returned to baseline and stable Postop Assessment: no apparent nausea or vomiting Anesthetic complications: no   No notable events documented.  Last Vitals:  Vitals:   06/11/24 0943 06/11/24 1000  BP: 130/88 (!) 124/56  Pulse:  (!) 109  Resp:  16  Temp: 37.2 C   SpO2: 98% 96%    Last Pain:  Vitals:   06/11/24 1113  TempSrc:   PainSc: Asleep                 Cordella P Joely Losier

## 2024-06-11 NOTE — Progress Notes (Signed)
 Pre-Procedure Nursing Note: Patient remains NPO since midnight. Vital signs stable. Patient is alert to self only. Consent for the procedure was obtained and placed in the chart. CHG bathing was completed per protocol. Nasal swab obtained and resulted positive for MRSA and Staphylococcus aureus.

## 2024-06-11 NOTE — Progress Notes (Signed)
 Progress Note   Patient: Mikayla Wilson FMW:[id number] DOB: 02-08-1931 DOA: 06/09/2024     2 DOS: the patient was seen and examined on 06/11/2024   Brief hospital course: HPI: Mikayla Wilson is a 89 y.o. female with medical history significant for dementia, asthma, coronary artery disease. Patient was brought to the ED reports of an unwitnessed fall.  Patient's ambulates with a wheelchair at baseline.  Patient was found at the head of the hallway from her room, in another patient's room, on the floor, with her wheelchair 2 feet away from her.  Patient has advanced dementia, does not recognize family. Patient's daughter Mikayla Wilson is at bedside.  Patient has not had a cough or difficulty breathing.   My evaluation, patient is awake, yelling out loud, incomprehensible.   ED Course: Temperature 98.7.  Heart rate 96-118.  Respirate rate 15-30.  Blood pressure systolic 121-144.  O2 sat greater 93% on room air. WBC 11.1. Chest x-ray negative for acute abnormality. Head and cervical CT-no acute intracranial abnormality, right upper lobe consolidation with surrounding nodularity, most likely infectious/inflammatory. Left hip CT-comminuted, mildly displaced and angulated fracture proximal left femur fractures,intertrochanteric and at the base of the femoral neck. IV Ativan  0.5 x 2 given.  IV Fentanyl  12.5 x 2 given. EDP consulted Dr. Beverley on-call for Ortho, recommended admission to [name], family opting for surgical evaluation, than nonsurgical management.  11/29: S/P Left intertrochanteric fracture fixation with intramedullary rod  Assessment and Plan: 89 year old female with significant medical history of dementia, asthma, CAD who was brought in for an unwitnessed fall leading to mildly displaced and angulated fracture of left femur.  Now, status post intertrochanteric fracture fixation with intramedullary nail on the left side.     Closed left hip fracture -Status post unwitnessed fall. -  CT shows - comminuted, mildly displaced and angulated fracture proximal left femur fractures, intertrochanteric and at the base of the femoral neck. - Nondisplaced fracture of left sacral ala-unclear chronicity. - Bone lesions in proximal femur and left sacral ala. - S/P Left intertrochanteric fracture fixation with intramedullary rod - PT/OT/DVT prophylaxis   Possible pneumonia -No respiratory symptoms noted. -WBC 11.1, tachycardic heart rate possibly from dehydration versus uncontrolled pain. -CT head/cervical spine-peripheral right upper lobe consolidation with surrounding nodularity, most likely infectious/inflammatory. - IV ceftriaxone  plus azithromycin    Asthma-stable.   Advanced dementia, anxiety, fall -Nursing home resident, ambulates with wheelchair. -On Lexapro  and Ativan  at nursing home for agitation. -Resume Lexapro  -IV Ativan  0.5 Q8 hourly as needed for agitation      Subjective: No fever no chills no nausea no vomiting  Physical Exam: Vitals:   06/11/24 0915 06/11/24 0930 06/11/24 0943 06/11/24 1000  BP: (!) 105/56 130/69 130/88 (!) 124/56  Pulse: 97 (!) 109  (!) 109  Resp: 14 (!) 24  16  Temp:   98.9 F (37.2 C)   TempSrc:      SpO2: 96% 97% 98% 96%  Weight:      Height:       Constitutional: Alert, awake, calm, comfortable HEENT: Neck supple Respiratory: clear to auscultation bilaterally, no wheezing, no crackles. Normal respiratory effort. No accessory muscle use.  Cardiovascular: Regular rate and rhythm, no murmurs / rubs / gallops. No extremity edema. 2+ pedal pulses. No carotid bruits.  Abdomen: no tenderness, no masses palpated. No hepatosplenomegaly. Bowel sounds positive.  Musculoskeletal: Left hip surgical site clean dry and intact Skin: no rashes, lesions, ulcers. No induration Neurologic: CN 2-12 grossly intact. Sensation intact,  DTR normal. Strength 5/5 x all 4 extremities.  Psychiatric: Normal judgment and insight. Alert and oriented x 3.  Normal mood.   Data Reviewed:  Reviewed  Family Communication: None available  Disposition: Status is: Inpatient Remains inpatient appropriate because: Ongoing recovery from left hip fracture surgical procedure requiring physical therapy and Occupational Therapy  Planned Discharge Destination: Skilled nursing facility    Time spent: 35 minutes  Author: Nena Rebel, MD 06/11/2024 4:19 PM  For on call review [email or web address].

## 2024-06-11 NOTE — Anesthesia Preprocedure Evaluation (Addendum)
 Anesthesia Evaluation  Patient identified by MRN, date of birth, ID band Patient confused    Reviewed: Allergy & Precautions, NPO status , Patient's Chart, lab work & pertinent test results  Airway Mallampati: II  TM Distance: >3 FB Neck ROM: Full    Dental  (+) Edentulous Upper, Edentulous Lower   Pulmonary asthma    Pulmonary exam normal        Cardiovascular + CAD   Rhythm:Regular Rate:Normal     Neuro/Psych   Anxiety Depression   Dementia    GI/Hepatic Neg liver ROS, hiatal hernia,GERD  Medicated,,  Endo/Other  negative endocrine ROS    Renal/GU negative Renal ROS  negative genitourinary   Musculoskeletal  (+) Arthritis , Osteoarthritis,  Hip fracture    Abdominal Normal abdominal exam  (+)   Peds  Hematology Lab Results      Component                Value               Date                      WBC                      11.1 (H)            06/09/2024                HGB                      12.3                06/09/2024                HCT                      37.5                06/09/2024                MCV                      96.2                06/09/2024                PLT                      169                 06/09/2024             Lab Results      Component                Value               Date                      NA                       144                 06/09/2024                K  4.5                 06/09/2024                CO2                      27                  06/09/2024                GLUCOSE                  103 (H)             06/09/2024                BUN                      18                  06/09/2024                CREATININE               0.90                06/09/2024                CALCIUM                   9.4                 06/09/2024                EGFR                     73                  06/10/2023                GFRNONAA                  >60                 06/09/2024              Anesthesia Other Findings   Reproductive/Obstetrics                              Anesthesia Physical Anesthesia Plan  ASA: 3  Anesthesia Plan: General   Post-op Pain Management:    Induction: Intravenous  PONV Risk Score and Plan: 3 and Ondansetron , Dexamethasone  and Treatment may vary due to age or medical condition  Airway Management Planned: Mask and Oral ETT  Additional Equipment: None  Intra-op Plan:   Post-operative Plan: Extubation in OR  Informed Consent: I have reviewed the patients History and Physical, chart, labs and discussed the procedure including the risks, benefits and alternatives for the proposed anesthesia with the patient or authorized representative who has indicated his/her understanding and acceptance.   Patient has DNR.  Discussed DNR with power of attorney.   Consent reviewed with POA  Plan Discussed with: CRNA  Anesthesia Plan Comments:          Anesthesia Quick Evaluation

## 2024-06-11 NOTE — Op Note (Signed)
 Operative Note  Mikayla Wilson  Surgery Date: 06/09/2024 - 06/11/2024 Surgeon: Dale Hock, MD Assistant(s): None  Preop Diagnosis(es):  Left femur intertrochanteric fracture History of right intertrochanteric hip fracture status post intramedullary nail Osteoporosis Advanced dementia  Postop Diagnosis(es):  Left femur intertrochanteric fracture History of right intertrochanteric hip fracture status post intramedullary nail Osteoporosis Advanced dementia  Operative Procedure(s): Cephalomedullary nail left intertrochanteric femur fracture    Anesthesia: The patient had administration of general anesthesia. Further details can be found in the anesthesia record.  Estimated Blood Loss: 50 mL  Complications:  None noted intraoperatively  Drains: None  Implants:  [employer] short 10 diameter nail with 90 mm lag screw. Full detailed list below.  Indications:  Mikayla Wilson is a 89 y.o. year old female who presented the emergency department due to a fall found to have a left intertrochanteric hip fracture.  Further details of my consult note regarding discussion of nonoperative versus surgical care with her daughter..  After thorough discussion of the risks and benefits of surgical management and alternative nonoperative treatment options, they elected to proceed with surgical treatment. Risks and complications were discussed and understood including, but not limited to, bleeding, infection, stiffness, numbness, damage to surrounding structures (including blood vessels and nerves), failure of the procedure, need for secondary or revision procedures, failure of healing, incomplete functional recovery, and worsening or chronic pain. Additional risks pertinent to the surgery and anesthesia also include pulmonary compromise, blood clots/pulmonary embolism, cardiac complications, and death. No guarantees were stated or implied. All questions were answered to the best of my ability  and the patient verbalized understanding.    Description of Procedure:  The patient was identified in the holding area, taken to the operating room and underwent successful induction of anesthesia. They were then placed on the operating table in a supine po on the Hana table sition, with all bony prominences well padded. Preprocedure antibiotics were administered. A time out was performed and all parties were in agreement with the patient identification, surgical site and planned procedure. The extremity was then prepped and draped in usual sterile fashion. A second time out was performed prior to incision, once again confirming the patient, site, procedure and expectations of surgery.  C-arm was brought in and the fracture was reduced with traction, and internal rotation.   Incision was made just proximal to the tip of the greater trochanter. Dissection was taken down through skin and subcutaneous tissue with scalpel. The guide pin was placed through the incision down to the tip of the greater trochanter and once an adequate start site was obtained on biplanar fluoroscopy, the guide pin was advanced into the proximal femur down to the level of the lesser trochanter.  Next, the opening reamer was used to ream out the proximal femur over the guide pin, while using a soft tissue guide.  The size 10 nail was opened and assembled on the back table. The nail was then inserted into the femoral canal and taken down until it was fully seated. Biplanar fluoroscopy confirmed intramedullary location.  A lateral incision was then made for the insertion of the lateral guide, through skin, subcutaneous tissue and iliotibial band. Through the lateral guide, a guide pin was advanced into the neck and head of the femur. It was confirmed to be in adequate position, center center on the AP and lateral views of the hip.  The measuring guide was used to determine that a 90 mm length screw would be  needed.  Next, the lag  screw drill was used through the lateral guide, reconfirming our screw length based on the drill. The lag screw was then inserted on hand. Proximally, the set screw was tightened to lock rotation.  A distal interlocking screw was placed after bicortical drilling through the lateral guide.  The proximal guide was then removed. Final x-rays were obtained showing adequate fracture reduction and stabilization with restoration of the valgus neck angle,  as well as nail and screw length and placement. The wounds were then thoroughly irrigated and closed in layered fashion with 0 Vicryl, 2-0 monocryl and 3-0 monocryl on the skin.     All counts at the end of the case were correct.  Post-Operative Condition: The patient was transferred to the PACU in stable condition.  Post-Operative Plan: They will be weightbearing as tolerated using a walker.  T okay to resume DVT chemoprophylaxis on postoperative day 1 we will see them back in 2-3 weeks for a wound check and for further review of surgical findings.  Implant Record:  Implant Name Type Inv. Item Serial No. Manufacturer Lot No. LRB No. Used Action  NAIL LESTER GAILS [id number] - [id number] Nail NAIL LESTER GAILS [employer] [id number] Left 1 Implanted  SCREW LAG SUBTROC 11X100 - [id number] Screw SCREW LAG SUBTROC 11X100  [employer] [id number] Left 1 Implanted  TRIGEN INTERTAN SUBTROCH LAG SCREW 11 MM X 90 MM Screw   [employer] [id number] Left 1 Implanted  SCREW TRIGEN LOW PROF 5.0X27.5 - [id number] Screw SCREW TRIGEN LOW PROF 5.0X27.5  [employer] [id number] Left 1 Implanted

## 2024-06-11 NOTE — Consult Note (Signed)
 Orthopedic Consultation Note  Current Hospital Day : Hospital Day: 3  Reason For Consult: Left intertrochanteric hip fracture  History of Present Illness:  Mikayla Wilson is a 89 y.o. female who presents to the emergency department due to a fall and pain in the left hip.  She is found have an intertrochanteric hip fracture for which orthopedic surgery was consulted.  She does live in a care facility and has advanced dementia.  History is taken from her daughter by phone  She has history of contralateral internal fixation of intertrochanteric fracture in 2017.  She does independently transfer from the bed to her wheelchair and is able to get around herself in the wheelchair.  Past Medical History:  Diagnosis Date   Allergy    Anxiety    Arthritis    Asthma    Cataract    Constipation    Coronary artery disease    Depression    Fibrocystic breast disease    GERD (gastroesophageal reflux disease)    Heart murmur    Hiatal hernia    Hiatal hernia    Osteoporosis     Past Surgical History:  Procedure Laterality Date   ABDOMINAL HYSTERECTOMY     CHOLECYSTECTOMY     CHOLECYSTECTOMY     EYE SURGERY     HIP SURGERY Right    INTRAMEDULLARY (IM) NAIL INTERTROCHANTERIC Right 11/30/2015   Procedure: INTRAMEDULLARY (IM) NAIL INTERTROCHANTERIC RIGHT HIP;  Surgeon: Lonni CINDERELLA Poli, MD;  Location: [facility];  Service: Orthopedics;  Laterality: Right;    Prior to Admission medications   Medication Sig Start Date End Date Taking? Authorizing Provider  acetaminophen  (TYLENOL ) 500 MG tablet Take 2 tablets (1,000 mg total) by mouth every 8 (eight) hours. Patient taking differently: Take 500-1,000 mg by mouth See admin instructions. Give 2 tablets (1000mg ) by mouth three times daily. Also, give 1 tablet (500mg ) every 6 hours as needed for pain. 09/08/23  Yes Tat, Alm, MD  Cholecalciferol (VITAMIN D3) 50 MCG (2000 UT) TABS Take 2,000 Units by mouth daily.   Yes [provider]   cyanocobalamin  (VITAMIN B12) 500 MCG tablet Take 1 tablet (500 mcg total) by mouth daily. 09/09/23  Yes Tat, Alm, MD  escitalopram  (LEXAPRO ) 10 MG tablet Take 10 mg by mouth daily.   Yes [provider]  famotidine  (PEPCID ) 20 MG tablet Take 1 tablet (20 mg total) by mouth 2 (two) times daily. 06/10/23  Yes Dettinger, Fonda LABOR, MD  ferrous sulfate 325 (65 FE) MG tablet Take 325 mg by mouth daily.   Yes [provider]  ibuprofen  (ADVIL ) 200 MG tablet Take 400 mg by mouth every 8 (eight) hours as needed (pain).   Yes [provider]  lactose free nutrition (BOOST) LIQD Take 237 mLs by mouth with breakfast, with lunch, and with evening meal.   Yes [provider]  loratadine  (EQ LORATADINE ) 10 MG tablet Take 1 tablet (10 mg total) by mouth daily. TAKE 1 TABLET BY MOUTH ONCE DAILY AS NEEDED FOR ALLERGIES 06/10/23  Yes Dettinger, Fonda LABOR, MD  LORazepam  (ATIVAN ) 0.5 MG tablet Take 0.5 mg by mouth daily in the afternoon.   Yes [provider]  LORazepam  (ATIVAN ) 1 MG tablet Take 1 tablet (1 mg total) by mouth every morning. Patient taking differently: Take 1 mg by mouth 2 (two) times daily. 09/09/23  Yes Tat, Alm, MD  Nutritional Supplements (NUTRITIONAL DRINK) LIQD Take 1 Dose by mouth daily in the afternoon. Magic Cup (90cc  free water  in 120cc) in the afternoon for to enhance kcals and protein in diet w/lunch tray.   Yes [provider]  pantoprazole  (PROTONIX ) 40 MG tablet Take 40 mg by mouth in the morning and at bedtime.   Yes [provider]  Polyethyl Glycol-Propyl Glycol (SYSTANE) 0.4-0.3 % GEL ophthalmic gel Place 1 Application into both eyes in the morning and at bedtime.   Yes [provider]  potassium chloride  (KLOR-CON  M) 10 MEQ tablet Take 1 tablet (10 mEq total) by mouth daily. 06/10/23  Yes Dettinger, Fonda LABOR, MD  rosuvastatin  (CRESTOR ) 5 MG tablet Take 1 tablet (5 mg total) by mouth daily. Patient taking  differently: Take 5 mg by mouth every evening. 06/10/23  Yes Dettinger, Fonda LABOR, MD  traZODone  (DESYREL ) 50 MG tablet Take 50 mg by mouth at bedtime.   Yes [provider]  sucralfate (CARAFATE) 1 g tablet Take 1 g by mouth 3 (three) times daily before meals. Patient not taking: Reported on 06/10/2024    [provider]    Physical Examination Left Lower Extremity: Skin intact Recently moving foot and toes Foot wwp   Imaging: X-rays of the left hip reviewed interpreted demonstrating intertrochanteric hip fracture without subtrochanteric extension  Assessment:   Mikayla Wilson is a 89 y.o. female with advanced dementia, recent fall and left intertrochanteric hip fracture.  I discussed both nonoperative surgical management with her daughter by phone.  We discussed that she is nonambulatory we could consider treating this nonoperatively.  She does appear to be in pain and independently transfers herself between the bed and wheelchair and likely would have gains in function with fixation of her fracture which we discussed.  We discussed associated risk of surgery including risk of anesthesia, nonunion, malunion, infection, neurovascular injury among others.  She was in favor of proceeding with surgery.  Plan:   Weightbearing, bedrest OR this morning for left hip DVT ppx: SCDs, chemoprophylaxis postoperatively NPO Appreciate medicine pre-op eval/optimization

## 2024-06-11 NOTE — Progress Notes (Signed)
 P't nasal swab came back positive for MRSA and Staphylococcus aureus. The hospitalist on call was notified Lavanda Horns, NP

## 2024-06-11 NOTE — Transfer of Care (Signed)
 Immediate Anesthesia Transfer of Care Note  Patient: Mikayla Wilson  Procedure(s) Performed: FIXATION, FRACTURE, INTERTROCHANTERIC, WITH INTRAMEDULLARY ROD (Left)  Patient Location: PACU  Anesthesia Type:General  Level of Consciousness: awake and confused  Airway & Oxygen Therapy: Patient Spontanous Breathing and Patient connected to face mask oxygen  Post-op Assessment: Report given to RN and Post -op Vital signs reviewed and stable  Post vital signs: Reviewed and stable  Last Vitals:  Vitals Value Taken Time  BP 124/74 06/11/24 09:08  Temp    Pulse 97 06/11/24 09:12  Resp 18 06/11/24 09:12  SpO2 100 % 06/11/24 09:12  Vitals shown include unfiled device data.  Last Pain:  Vitals:   06/11/24 0639  TempSrc: Axillary  PainSc:          Complications: No notable events documented. Pt to PACU all questions answered vss drips used during taken for PACU RNs in case needed report given

## 2024-06-11 NOTE — Plan of Care (Signed)
  Problem: Education: Goal: Knowledge of General Education information will improve Description: Including pain rating scale, medication(s)/side effects and non-pharmacologic comfort measures Outcome: Not Progressing   Problem: Health Behavior/Discharge Planning: Goal: Ability to manage health-related needs will improve Outcome: Not Progressing   Problem: Clinical Measurements: Goal: Ability to maintain clinical measurements within normal limits will improve Outcome: Progressing Goal: Will remain free from infection Outcome: Progressing Goal: Diagnostic test results will improve Outcome: Progressing Goal: Respiratory complications will improve Outcome: Progressing Goal: Cardiovascular complication will be avoided Outcome: Progressing   Problem: Activity: Goal: Risk for activity intolerance will decrease Outcome: Not Progressing   Problem: Nutrition: Goal: Adequate nutrition will be maintained Outcome: Progressing   Problem: Coping: Goal: Level of anxiety will decrease Outcome: Progressing   Problem: Elimination: Goal: Will not experience complications related to bowel motility Outcome: Progressing Goal: Will not experience complications related to urinary retention Outcome: Progressing   Problem: Pain Managment: Goal: General experience of comfort will improve and/or be controlled Outcome: Progressing   Problem: Safety: Goal: Ability to remain free from injury will improve Outcome: Progressing   Problem: Skin Integrity: Goal: Risk for impaired skin integrity will decrease Outcome: Progressing

## 2024-06-11 NOTE — Discharge Instructions (Signed)
 Orthopaedic Surgery Discharge Instructions  Surgery: Left hip intramedullary fixation  Weight bearing: Full weightbearing as tolerated using a walker and for transfers  Dressings/Incisions: Keep clean and dry at all times. If dressings become wet or soiled, they should be changed with regular dry gauze or a clean bandage. It is OK to shower, gently pat incision or dressing dry afterwards. Leave steri-strips in place until they fall off on their own. DO NOT put anything on your incisions (cream, ointment, lotion, etc).   Follow up appointment: You are scheduled to follow up with Dr. Germaine. If you do not know when your follow up appointment is, please call [phone] to schedule your appointment.

## 2024-06-11 NOTE — Plan of Care (Signed)

## 2024-06-11 NOTE — Progress Notes (Signed)
 Orthopaedic Surgery Progress Note  No acute issues in PACU.  Weaning off pressors  Exam: Left Lower Extremity: Dressing intact Grossly moving foot Foot wwp   Assessment: 89 y.o. female * Day of Surgery * status post intramedullary nail left intertrochanteric hip fracture  Plan: Weightbearing as tolerated using a walker, okay for transfers Dressing: Aquacel, leave in place until postop visit, okay to remove if soiled and replace with regular dry gauze Band-Aid OK to resume DVT chemoppx or anticoagulation on post op day 1 Perioperative ancef  for 24 PT beginning on POD#1 Discharge instructions placed in Epic

## 2024-06-12 DIAGNOSIS — S72002A Fracture of unspecified part of neck of left femur, initial encounter for closed fracture: Secondary | ICD-10-CM | POA: Diagnosis not present

## 2024-06-12 LAB — BASIC METABOLIC PANEL WITH GFR
Anion gap: 9 (ref 5–15)
BUN: 12 mg/dL (ref 8–23)
CO2: 25 mmol/L (ref 22–32)
Calcium: 7.9 mg/dL — ABNORMAL LOW (ref 8.9–10.3)
Chloride: 104 mmol/L (ref 98–111)
Creatinine, Ser: 0.87 mg/dL (ref 0.44–1.00)
GFR, Estimated: >60 mL/min (ref >60)
Glucose, Bld: 102 mg/dL — ABNORMAL HIGH (ref 70–99)
Potassium: 3.3 mmol/L — ABNORMAL LOW (ref 3.5–5.1)
Sodium: 138 mmol/L (ref 135–145)

## 2024-06-12 LAB — CBC
HCT: 22.4 % — ABNORMAL LOW (ref 36.0–46.0)
Hemoglobin: 7.4 g/dL — ABNORMAL LOW (ref 12.0–15.0)
MCH: 31.9 pg (ref 26.0–34.0)
MCHC: 33 g/dL (ref 30.0–36.0)
MCV: 96.6 fL (ref 80.0–100.0)
Platelets: 96 K/uL — ABNORMAL LOW (ref 150–400)
RBC: 2.32 MIL/uL — ABNORMAL LOW (ref 3.87–5.11)
RDW: 12.9 % (ref 11.5–15.5)
WBC: 7.2 K/uL (ref 4.0–10.5)
nRBC: 0 % (ref 0.0–0.2)

## 2024-06-12 MED ORDER — POTASSIUM CHLORIDE 10 MEQ/100ML IV SOLN
10.0000 meq | INTRAVENOUS | Status: AC
  Administered 2024-06-12 (×3): 10 meq via INTRAVENOUS
  Filled 2024-06-12 (×3): qty 100

## 2024-06-12 MED ORDER — FERROUS GLUCONATE 324 (38 FE) MG PO TABS
324.0000 mg | ORAL_TABLET | Freq: Two times a day (BID) | ORAL | Status: DC
  Administered 2024-06-12 – 2024-06-15 (×7): 324 mg via ORAL
  Filled 2024-06-12 (×8): qty 1

## 2024-06-12 MED ORDER — AZITHROMYCIN 250 MG PO TABS
500.0000 mg | ORAL_TABLET | Freq: Every day | ORAL | Status: AC
  Administered 2024-06-12 – 2024-06-13 (×2): 500 mg via ORAL
  Filled 2024-06-12 (×2): qty 2

## 2024-06-12 NOTE — Plan of Care (Signed)
  Problem: Education: Goal: Knowledge of General Education information will improve Description: Including pain rating scale, medication(s)/side effects and non-pharmacologic comfort measures Outcome: Not Progressing   Problem: Health Behavior/Discharge Planning: Goal: Ability to manage health-related needs will improve Outcome: Not Progressing   Problem: Activity: Goal: Risk for activity intolerance will decrease Outcome: Progressing   Problem: Nutrition: Goal: Adequate nutrition will be maintained Outcome: Not Progressing

## 2024-06-12 NOTE — Plan of Care (Signed)
   Problem: Education: Goal: Knowledge of General Education information will improve Description: Including pain rating scale, medication(s)/side effects and non-pharmacologic comfort measures Outcome: Not Progressing

## 2024-06-12 NOTE — Progress Notes (Addendum)
 Progress Note   Patient: Mikayla Wilson FMW:[id number] DOB: October 17, 1930 DOA: 06/09/2024     3 DOS: the patient was seen and examined on 06/12/2024   Brief hospital course: HPI: Mikayla Wilson is a 89 y.o. female with medical history significant for dementia, asthma, coronary artery disease. Patient was brought to the ED reports of an unwitnessed fall.  Patient's ambulates with a wheelchair at baseline.  Patient was found at the head of the hallway from her room, in another patient's room, on the floor, with her wheelchair 2 feet away from her.  Patient has advanced dementia, does not recognize family. Patient's daughter Mikayla Wilson is at bedside.  Patient has not had a cough or difficulty breathing.   My evaluation, patient is awake, yelling out loud, incomprehensible.   ED Course: Temperature 98.7.  Heart rate 96-118.  Respirate rate 15-30.  Blood pressure systolic 121-144.  O2 sat greater 93% on room air. WBC 11.1. Chest x-ray negative for acute abnormality. Head and cervical CT-no acute intracranial abnormality, right upper lobe consolidation with surrounding nodularity, most likely infectious/inflammatory. Left hip CT-comminuted, mildly displaced and angulated fracture proximal left femur fractures,intertrochanteric and at the base of the femoral neck. IV Ativan  0.5 x 2 given.  IV Fentanyl  12.5 x 2 given. EDP consulted Dr. Beverley on-call for Ortho, recommended admission to [name], family opting for surgical evaluation, than nonsurgical management.  11/29: S/P Left intertrochanteric fracture fixation with intramedullary rod  Assessment and Plan:  89 year old female with significant medical history of dementia, asthma, CAD who was brought in for an unwitnessed fall leading to mildly displaced and angulated fracture of left femur.  Now, status post intertrochanteric fracture fixation with intramedullary nail on the left side.    Closed left hip fracture -Status post unwitnessed  fall. -CT: comminuted, mildly displaced and angulated fracture proximal left femur fractures, intertrochanteric and at the base of the femoral neck. - Nondisplaced fracture of left sacral ala-unclear chronicity. - Bone lesions in proximal femur and left sacral ala. - S/P Left intertrochanteric fracture fixation with IM nail 11/29 - PT/OT, going back to skilled nursing facility once stable likely Monday -DVT prophylaxis with s/q heparin    Possible pneumonia -No respiratory symptoms noted. -WBC 11.1, tachycardic heart rate possibly from dehydration versus uncontrolled pain. -CT head/cervical spine-peripheral right upper lobe consolidation with surrounding nodularity, most likely infectious/inflammatory. - IV ceftriaxone  plus azithromycin  11/27 for 5 days EOT: 12/1  Hypokalemia Potassium replaced will check potassium level in the morning   Asthma-stable.   Advanced dementia, anxiety, fall -Nursing home resident, ambulates with wheelchair. -On Lexapro  and Ativan  at nursing home for agitation. -Resume Lexapro  -IV Ativan  0.5 Q8 hourly as needed for agitation  Postop anemia due to acute blood loss - There is no signs of GI bleeding - Will supplement iron - Check H&H tomorrow      Subjective: Not able to provide meaningful review of systems due to severe dementia  Physical Exam: Vitals:   06/11/24 2319 06/12/24 0413 06/12/24 0737 06/12/24 1345  BP: (!) 93/55 (!) 151/88 137/80 93/68  Pulse: 95 85 (!) 103 80  Resp: 18 15 20 18   Temp: 98.6 F (37 C) 98.1 F (36.7 C) 98.4 F (36.9 C) 97.7 F (36.5 C)  TempSrc:   Oral Oral  SpO2: 94% 94% 97% 97%  Weight:      Height:       Constitutional: Alert, awake, pleasantly demented making noise HEENT: Neck supple Respiratory: clear to auscultation bilaterally, no wheezing,  no crackles. Normal respiratory effort. No accessory muscle use.  Cardiovascular: Regular rate and rhythm, no murmurs / rubs / gallops. No extremity edema. 2+ pedal  pulses. No carotid bruits.  Abdomen: no tenderness, no masses palpated. No hepatosplenomegaly. Bowel sounds positive.  Musculoskeletal: Surgical site is clean dry and intact Skin: no rashes, lesions, ulcers. No induration Neurologic: CN 2-12 grossly intact. Sensation intact, DTR normal. Strength 5/5 x all 4 extremities.    Data Reviewed:  Potassium3.3 and hemoglobin 7.4  Family Communication: None available today  Disposition: Status is: Inpatient Remains inpatient appropriate because: Ongoing recovery from surgical procedure of the hip  Planned Discharge Destination: Skilled nursing facility    Time spent: 35 minutes  Author: Nena Rebel, MD 06/12/2024 3:23 PM  For on call review [email or web address].

## 2024-06-13 DIAGNOSIS — E43 Unspecified severe protein-calorie malnutrition: Secondary | ICD-10-CM | POA: Insufficient documentation

## 2024-06-13 DIAGNOSIS — S72002A Fracture of unspecified part of neck of left femur, initial encounter for closed fracture: Secondary | ICD-10-CM | POA: Diagnosis not present

## 2024-06-13 LAB — BASIC METABOLIC PANEL WITH GFR
Anion gap: 11 (ref 5–15)
BUN: 11 mg/dL (ref 8–23)
CO2: 24 mmol/L (ref 22–32)
Calcium: 8 mg/dL — ABNORMAL LOW (ref 8.9–10.3)
Chloride: 104 mmol/L (ref 98–111)
Creatinine, Ser: 0.75 mg/dL (ref 0.44–1.00)
GFR, Estimated: >60 mL/min (ref >60)
Glucose, Bld: 75 mg/dL (ref 70–99)
Potassium: 3.5 mmol/L (ref 3.5–5.1)
Sodium: 139 mmol/L (ref 135–145)

## 2024-06-13 LAB — CBC
HCT: 23.9 % — ABNORMAL LOW (ref 36.0–46.0)
Hemoglobin: 7.8 g/dL — ABNORMAL LOW (ref 12.0–15.0)
MCH: 31.6 pg (ref 26.0–34.0)
MCHC: 32.6 g/dL (ref 30.0–36.0)
MCV: 96.8 fL (ref 80.0–100.0)
Platelets: 125 K/uL — ABNORMAL LOW (ref 150–400)
RBC: 2.47 MIL/uL — ABNORMAL LOW (ref 3.87–5.11)
RDW: 13.3 % (ref 11.5–15.5)
WBC: 8.2 K/uL (ref 4.0–10.5)
nRBC: 0 % (ref 0.0–0.2)

## 2024-06-13 MED ORDER — ENSURE PLUS HIGH PROTEIN PO LIQD
237.0000 mL | Freq: Two times a day (BID) | ORAL | Status: DC
  Administered 2024-06-13 – 2024-06-14 (×3): 237 mL via ORAL

## 2024-06-13 MED ORDER — ADULT MULTIVITAMIN W/MINERALS CH
1.0000 | ORAL_TABLET | Freq: Every day | ORAL | Status: DC
  Administered 2024-06-13 – 2024-06-15 (×3): 1 via ORAL
  Filled 2024-06-13 (×3): qty 1

## 2024-06-13 MED ORDER — OXYCODONE HCL 5 MG PO TABS: 5.0000 mg | ORAL_TABLET | ORAL | Status: DC | PRN

## 2024-06-13 MED ORDER — HYDROMORPHONE HCL 1 MG/ML IJ SOLN: 0.5000 mg | INTRAMUSCULAR | Status: DC | PRN

## 2024-06-13 MED ORDER — THIAMINE MONONITRATE 100 MG PO TABS
100.0000 mg | ORAL_TABLET | Freq: Every day | ORAL | Status: DC
  Administered 2024-06-13 – 2024-06-15 (×3): 100 mg via ORAL
  Filled 2024-06-13 (×3): qty 1

## 2024-06-13 NOTE — Evaluation (Signed)
 Physical Therapy Evaluation Patient Details Name: Mikayla Wilson MRN: [id number] DOB: 05-19-1931 Today's Date: 06/13/2024  History of Present Illness  Pt is 89 yo female who presents on 06/09/24 from SNF with unwitnessed fall without LOC. Sustained L proximal femur fx, underwent ORIF 11/29.  PMH: dementia, asthma, CAD  Clinical Impression  Pt admitted with above diagnosis. Pt from SNF, unclear whether she fell out of w/c or was ambulating pushing w/c and fell. Pt unable to converse or follow more than a few basic commands. Pt tolerated pivoting to EOB with max A. Needed mod A to sit EOB. Pt crying and moaning in sitting and not initiating STS when cued so did not progress to this today. Patient will benefit from continued inpatient follow up therapy, <3 hours/day.  Pt currently with functional limitations due to the deficits listed below (see PT Problem List). Pt will benefit from acute skilled PT to increase their independence and safety with mobility to allow discharge.           If plan is discharge home, recommend the following: Two people to help with walking and/or transfers;Two people to help with bathing/dressing/bathroom;Assistance with feeding;Assist for transportation;Supervision due to cognitive status   Can travel by private vehicle   No    Equipment Recommendations None recommended by PT  Recommendations for Other Services  OT consult    Functional Status Assessment Patient has had a recent decline in their functional status and demonstrates the ability to make significant improvements in function in a reasonable and predictable amount of time.     Precautions / Restrictions Precautions Precautions: Fall Recall of Precautions/Restrictions: Impaired Restrictions Weight Bearing Restrictions Per Provider Order: Yes LLE Weight Bearing Per Provider Order: Weight bearing as tolerated      Mobility  Bed Mobility Overal bed mobility: Needs Assistance Bed Mobility:  Supine to Sit, Sit to Supine     Supine to sit: Max assist Sit to supine: Max assist   General bed mobility comments: pt resistant to rolling due to pain but was able to come up to long sitting and be pivoted to EOB which she tolerated better.    Transfers                   General transfer comment: pt crying and moaning EOB, no initiation of STS with cues, did not push this    Ambulation/Gait               General Gait Details: unable currently  Stairs            Wheelchair Mobility     Tilt Bed    Modified Rankin (Stroke Patients Only)       Balance Overall balance assessment: Needs assistance, History of Falls Sitting-balance support: Feet supported, Bilateral upper extremity supported Sitting balance-Leahy Scale: Zero Sitting balance - Comments: needed mod A to maintaining sitting EOB due to R and posterior lean Postural control: Posterior lean, Right lateral lean                                   Pertinent Vitals/Pain Pain Assessment Pain Assessment: Faces Faces Pain Scale: Hurts whole lot Pain Location: L hip Pain Descriptors / Indicators: Crying, Moaning, Operative site guarding Pain Intervention(s): Limited activity within patient's tolerance, Monitored during session    Home Living Family/patient expects to be discharged to:: Skilled nursing facility  Prior Function Prior Level of Function : Needs assist             Mobility Comments: per chart pt uses w/c at baseline, unsure whether she fell out of it or fell while pushing it ADLs Comments: assist needed     Extremity/Trunk Assessment   Upper Extremity Assessment Upper Extremity Assessment: Defer to OT evaluation;Generalized weakness    Lower Extremity Assessment Lower Extremity Assessment: Generalized weakness    Cervical / Trunk Assessment Cervical / Trunk Assessment: Kyphotic  Communication    Communication Communication: Impaired Factors Affecting Communication: Difficulty expressing self;Reduced clarity of speech    Cognition Arousal: Alert Behavior During Therapy: Anxious   PT - Cognitive impairments: History of cognitive impairments                       PT - Cognition Comments: advanced dementia Following commands: Impaired Following commands impaired: Follows one step commands inconsistently     Cueing Cueing Techniques: Verbal cues, Gestural cues, Tactile cues     General Comments General comments (skin integrity, edema, etc.): pt bed soaked of urine. Linens changed and NT placed new purewick end of session    Exercises     Assessment/Plan    PT Assessment Patient needs continued PT services  PT Problem List Decreased strength;Decreased activity tolerance;Decreased balance;Decreased mobility;Decreased range of motion;Decreased coordination;Decreased cognition;Decreased knowledge of use of DME;Decreased safety awareness;Decreased knowledge of precautions;Pain       PT Treatment Interventions DME instruction;Gait training;Functional mobility training;Therapeutic activities;Therapeutic exercise;Balance training;Neuromuscular re-education;Cognitive remediation;Patient/family education    PT Goals (Current goals can be found in the Care Plan section)  Acute Rehab PT Goals Patient Stated Goal: pt unable to state PT Goal Formulation: Patient unable to participate in goal setting Time For Goal Achievement: 06/27/24 Potential to Achieve Goals: Fair    Frequency Min 2X/week     Co-evaluation               AM-PAC PT 6 Clicks Mobility  Outcome Measure Help needed turning from your back to your side while in a flat bed without using bedrails?: A Lot Help needed moving from lying on your back to sitting on the side of a flat bed without using bedrails?: A Lot Help needed moving to and from a bed to a chair (including a wheelchair)?: Total Help  needed standing up from a chair using your arms (e.g., wheelchair or bedside chair)?: Total Help needed to walk in hospital room?: Total Help needed climbing 3-5 steps with a railing? : Total 6 Click Score: 8    End of Session   Activity Tolerance: Patient limited by pain Patient left: in bed;with call bell/phone within reach;with bed alarm set;Other (comment) (with posey activity belt) Nurse Communication: Mobility status PT Visit Diagnosis: History of falling (Z91.81);Muscle weakness (generalized) (M62.81);Difficulty in walking, not elsewhere classified (R26.2);Pain Pain - Right/Left: Left Pain - part of body: Hip    Time: 9061-9041 PT Time Calculation (min) (ACUTE ONLY): 20 min   Charges:   PT Evaluation $PT Eval Moderate Complexity: 1 Mod   PT General Charges $$ ACUTE PT VISIT: 1 Visit         Richerd Lipoma, PT  Acute Rehab Services Secure chat preferred Office [phone]   Richerd CROME Melissa Tomaselli 06/13/2024, 11:26 AM

## 2024-06-13 NOTE — Progress Notes (Addendum)
 Nutrition Follow-up  DOCUMENTATION CODES:   Severe malnutrition in context of social or environmental circumstances  INTERVENTION:  100 mg Thiamine PO daily for 7 days. Multivitamin PO daily. Ensure Plus High Protein PO BID. Each supplement provides 350 Kcals and 20 grams of protein. Magic Cup BID. Each supplement provides 290 Kcals and 9 grams of protein. Bowel care per protocol. Last BM noted to be PTA. Continue regular diet with feeding assistance. Recommend goals of care discussion.   NUTRITION DIAGNOSIS:   Severe Malnutrition related to social / environmental circumstances as evidenced by severe muscle depletion, severe fat depletion, energy intake < or equal to 50% for > or equal to 1 month, percent weight loss of 14% in 6 months - Diagnosis updated with additional data obtained.   GOAL:   Patient will meet greater than or equal to 90% of their needs - not currently being met, continue to monitor.    MONITOR:   PO intake, Supplement acceptance, Labs, Weight trends  REASON FOR ASSESSMENT:   Follow-up for: Consult Assessment of nutrition requirement/status (Hip/Femur fracture patient)  ASSESSMENT:   Past medical history significant for dementia, asthma, coronary artery disease presents after unwitnessed fall.  11/29 L intertrochanteric fracture fixation  Visited the patient who remains confused due to advanced dementia. Unable to decipher her speech. Her lunch is untouched at bedside. Discussed with LPN who states the patient has not been eating but has drank some juice and water. Telephoned the patient's daughter Josiah [phone]) who states the patient has not eaten well in several weeks and rejects any food she brings to the nursing home. She reports a usual body weight of 145 lbs for the patient which she last weighed in April. [name] has noticed severe weight loss in the patient. She inquired about enteral nutrition and feeding tubes - advised her to have a goals  of care discussion with provider. Secure chat with attending who notes enteral nutrition is contraindicated for advanced dementia and has ordered a palliative consult.  Scheduled Meds:  azithromycin   500 mg Oral Daily   Chlorhexidine  Gluconate Cloth  6 each Topical Daily   escitalopram   5 mg Oral Daily   ferrous gluconate  324 mg Oral BID WC   heparin   5,000 Units Subcutaneous Q8H   mupirocin ointment  1 Application Nasal BID   OLANZapine  zydis  5 mg Oral QHS   Continuous Infusions:  cefTRIAXone  (ROCEPHIN )  IV 2 g (06/12/24 2055)    Diet Order             Diet regular Room service appropriate? Yes; Fluid consistency: Thin  Diet effective now                  Meal Intake: Minimal  Labs:     Latest Ref Rng & Units 06/13/2024    6:11 AM 06/12/2024    2:41 AM 06/09/2024    1:58 PM  CMP  Glucose 70 - 99 mg/dL 75  897  896   BUN 8 - 23 mg/dL 11  12  18    Creatinine 0.44 - 1.00 mg/dL 9.24  9.12  9.09   Sodium 135 - 145 mmol/L 139  138  144   Potassium 3.5 - 5.1 mmol/L 3.5  3.3  4.5   Chloride 98 - 111 mmol/L 104  104  106   CO2 22 - 32 mmol/L 24  25  27    Calcium  8.9 - 10.3 mg/dL 8.0  7.9  9.4   Total  Protein 6.5 - 8.1 g/dL   7.0   Total Bilirubin 0.0 - 1.2 mg/dL   0.3   Alkaline Phos 38 - 126 U/L   86   AST 15 - 41 U/L   30   ALT 0 - 44 U/L   24       I/O: +2 L since admit  NUTRITION - FOCUSED PHYSICAL EXAM: Flowsheet Row Most Recent Value  Orbital Region Moderate depletion  Upper Arm Region Severe depletion  Thoracic and Lumbar Region Severe depletion  Buccal Region Moderate depletion  Temple Region Moderate depletion  Clavicle Bone Region Moderate depletion  Clavicle and Acromion Bone Region Severe depletion  Scapular Bone Region Moderate depletion  Dorsal Hand Severe depletion  Patellar Region Mild depletion  Anterior Thigh Region Mild depletion  Posterior Calf Region Mild depletion  Edema (RD Assessment) Mild  Hair Reviewed  Eyes Unable to assess   Mouth Unable to assess  Skin Reviewed  Nails Reviewed    EDUCATION NEEDS:   Not appropriate for education at this time  Skin:  Skin Assessment: Skin Integrity Issues: Skin Integrity Issues:: Incisions Incisions: left hip  Last BM:  PTA  Height:   Ht Readings from Last 1 Encounters:  06/09/24 5' 4 (1.626 m)    Weight:    Weight Change: 20 lbs (14%) loss in 6 months per daughter  Usual Body Weight: 145 lbs in April 2025 per daughter  Edema: non-pitting LLE (below surgical site)  Ideal Body Weight:  55 kg   BMI:  Body mass index is 21.53 kg/m.  Estimated Nutritional Needs:  Kcal:  1500-1700 Protein:  70-90 g Fluid:  1.5-1.7L/d    Mikayla Ruth, MS, RDN, LDN [place]. [facility] See AMION for contact information Secure chat preferred

## 2024-06-13 NOTE — Progress Notes (Signed)
 Orthopaedic Surgery Progress Note  No acute issues. Resting in bed this AM  Exam: Left Lower Extremity: Dressing intact Grossly moving foot Foot wwp   Assessment: 89 y.o. female 2 Days Post-Op status post intramedullary nail left intertrochanteric hip fracture  Plan: Weightbearing as tolerated using a walker, okay for transfers Dressing: Aquacel, leave in place until postop visit, okay to remove if soiled and replace with regular dry gauze Band-Aid OK to resume DVT chemoppx or anticoagulation on post op day 1 PT beginning on POD#1 Discharge instructions placed in Epic

## 2024-06-13 NOTE — Care Management Important Message (Signed)
 Important Message  Patient Details  Name: Mikayla Wilson MRN: [id number] Date of Birth: Nov 17, 1930   Important Message Given:  Yes - Medicare IM     [name] 06/13/2024, 1:24 PM

## 2024-06-13 NOTE — Evaluation (Signed)
 Occupational Therapy Evaluation Patient Details Name: Mikayla Wilson MRN: [id number] DOB: [birth year] Today's Date: 06/13/2024   History of Present Illness   Pt is 89 yo female who presents on 06/09/24 from SNF with unwitnessed fall without LOC. Sustained L proximal femur fx, underwent ORIF 11/29.  PMH: dementia, asthma, CAD     Clinical Impressions PTA, pt from SNF receiving assist with ADL; unclear whether she consistently needed assist with transfers and no family present to determine. Upon eval, pt grossly max A for mobility and BADL due to pain and decr cognition. Pt pleasant throughout, but with difficulty with expressive communication, does have fear of mobility and anticipating pain with OT initiation of assist to EOB. Patient will benefit from continued inpatient follow up therapy, <3 hours/day      If plan is discharge home, recommend the following:   Two people to help with walking and/or transfers;Two people to help with bathing/dressing/bathroom;Assistance with cooking/housework;Assist for transportation;Help with stairs or ramp for entrance     Functional Status Assessment   Patient has had a recent decline in their functional status and demonstrates the ability to make significant improvements in function in a reasonable and predictable amount of time.     Equipment Recommendations   None recommended by OT     Recommendations for Other Services         Precautions/Restrictions   Precautions Precautions: Fall Recall of Precautions/Restrictions: Impaired Restrictions Weight Bearing Restrictions Per Provider Order: Yes LLE Weight Bearing Per Provider Order: Weight bearing as tolerated     Mobility Bed Mobility Overal bed mobility: Needs Assistance Bed Mobility: Supine to Sit, Sit to Supine     Supine to sit: Max assist Sit to supine: Max assist   General bed mobility comments: pt resistant to rolling due to pain but was able to come up to long  sitting and be pivoted to EOB which she tolerated better, however, did not tolerate long with max attempts to return to supine    Transfers                   General transfer comment: pt crying and moaning EOB, with attempt to return to supine; did not push this      Balance Overall balance assessment: Needs assistance, History of Falls Sitting-balance support: Feet supported, Bilateral upper extremity supported Sitting balance-Leahy Scale: Zero Sitting balance - Comments: needed mod A to maintaining sitting EOB due to R and posterior lean Postural control: Posterior lean, Right lateral lean                                 ADL either performed or assessed with clinical judgement   ADL Overall ADL's : Needs assistance/impaired Eating/Feeding: Maximal assistance Eating/Feeding Details (indicate cue type and reason): due to poor initiation. pt took one bite and masticated food (not fully/poor mastication), then pocketed in cheek requiring OT to retrieve out of mouth. Left note for RN recommending to assist pt with self feeding Grooming: Set up;Wash/dry face;Bed level                                       Vision   Additional Comments: suspect some peripheral vision loss consistent with dementia     Perception         Praxis  Pertinent Vitals/Pain Pain Assessment Pain Assessment: Faces Faces Pain Scale: Hurts whole lot Pain Location: L hip Pain Descriptors / Indicators: Crying, Moaning, Operative site guarding Pain Intervention(s): Limited activity within patient's tolerance, Monitored during session     Extremity/Trunk Assessment Upper Extremity Assessment Upper Extremity Assessment: Generalized weakness   Lower Extremity Assessment Lower Extremity Assessment: Defer to PT evaluation   Cervical / Trunk Assessment Cervical / Trunk Assessment: Kyphotic   Communication Communication Communication: Impaired Factors Affecting  Communication: Difficulty expressing self;Reduced clarity of speech   Cognition Arousal: Alert Behavior During Therapy: Anxious Cognition: History of cognitive impairments (dementia at basleline)             OT - Cognition Comments: dementia at baseline, decr command following, demonstrates focused attention to task. unsure baseline                 Following commands: Impaired Following commands impaired: Follows one step commands inconsistently     Cueing  General Comments   Cueing Techniques: Verbal cues;Gestural cues;Tactile cues      Exercises     Shoulder Instructions      Home Living Family/patient expects to be discharged to:: Skilled nursing facility                                        Prior Functioning/Environment Prior Level of Function : Needs assist             Mobility Comments: per chart pt uses w/c at baseline, unsure whether she fell out of it or fell while pushing it ADLs Comments: assist needed    OT Problem List: Decreased strength;Impaired balance (sitting and/or standing);Decreased safety awareness;Decreased cognition;Decreased activity tolerance;Pain   OT Treatment/Interventions: Self-care/ADL training;Therapeutic exercise;DME and/or AE instruction;Therapeutic activities;Patient/family education;Balance training      OT Goals(Current goals can be found in the care plan section)   Acute Rehab OT Goals OT Goal Formulation: Patient unable to participate in goal setting Time For Goal Achievement: 06/27/24 Potential to Achieve Goals: Fair   OT Frequency:  Min 2X/week    Co-evaluation              AM-PAC OT 6 Clicks Daily Activity     Outcome Measure Help from another person eating meals?: A Lot Help from another person taking care of personal grooming?: A Lot Help from another person toileting, which includes using toliet, bedpan, or urinal?: Total Help from another person bathing (including washing,  rinsing, drying)?: A Lot Help from another person to put on and taking off regular upper body clothing?: A Lot Help from another person to put on and taking off regular lower body clothing?: Total 6 Click Score: 10   End of Session Nurse Communication: Mobility status  Activity Tolerance: Patient limited by pain;Other (comment) (limited by cognition) Patient left: in bed;with call bell/phone within reach;with bed alarm set  OT Visit Diagnosis: Unsteadiness on feet (R26.81);Muscle weakness (generalized) (M62.81);Pain                Time: 1344-1411 OT Time Calculation (min): 27 min Charges:  OT General Charges $OT Visit: 1 Visit OT Evaluation $OT Eval Moderate Complexity: 1 Mod OT Treatments $Self Care/Home Management : 8-22 mins  Elma JONETTA Lebron FREDERICK, OTR/L [facility] Office: [phone]   Elma JONETTA Lebron 06/13/2024, 3:20 PM

## 2024-06-13 NOTE — Plan of Care (Signed)
   Problem: Clinical Measurements: Goal: Will remain free from infection Outcome: Progressing Goal: Respiratory complications will improve Outcome: Progressing

## 2024-06-13 NOTE — Progress Notes (Signed)
 PROGRESS NOTE    Mikayla Wilson  FMW:[id number] DOB: 26-Apr-1931 DOA: 06/09/2024 PCP: Pcp, No   Brief Narrative:  89 y.o. female with medical history significant for dementia, asthma, coronary artery disease. Patient was brought to the ED reports of an unwitnessed fall.  Patient's ambulates with a wheelchair at baseline. Left hip CT-comminuted, mildly displaced and angulated fracture proximal left femur fractures,intertrochanteric and at the base of the femoral neck.   S/P Left intertrochanteric fracture fixation with IM nail 11/29  Back to SNF once medically stable  12/1: consulted Palliative as she is not eating well since weeks now.  Assessment & Plan:  Principal Problem:   Closed left hip fracture (HCC) Active Problems:   Anxiety   Asthma   Malnutrition of moderate degree    89 y.o. female with medical history significant for dementia, asthma, coronary artery disease. Patient was brought to the ED reports of an unwitnessed fall.   Closed left hip fracture -Status post unwitnessed fall. -CT: comminuted, mildly displaced and angulated fracture proximal left femur fractures, intertrochanteric and at the base of the femoral neck. - Nondisplaced fracture of left sacral ala-unclear chronicity. - Bone lesions in proximal femur and left sacral ala. - S/P Left intertrochanteric fracture fixation with IM nail 11/29 - PT/OT, going back to skilled nursing facility once stable likely Monday -DVT prophylaxis with s/q heparin    Possible bacterial community acquired pneumonia -No respiratory symptoms noted. -WBC 11.1, tachycardic heart rate possibly from dehydration versus uncontrolled pain. -CT head/cervical spine-peripheral right upper lobe consolidation with surrounding nodularity, most likely infectious/inflammatory. - IV ceftriaxone  plus azithromycin  11/27 for 5 days   Hypokalemia Prn repletion   Asthma-stable.   Advanced dementia, anxiety, fall, Poor oral  intake,POA: -Nursing home resident, ambulates with wheelchair. -On Lexapro  and Ativan  at nursing home for agitation. -Resume Lexapro  -IV Ativan  0.5 Q8 hourly as needed for agitation -Spoke to Dietician on 12/1 and placed consult for palliative care. -Not a candidate for PEG placement.   Postop anemia due to acute blood loss - There is no signs of GI bleeding - f/u H&H closely.  Disposition: SNF   DVT prophylaxis: heparin  injection 5,000 Units Start: 06/09/24 2300     Code Status: Limited: Do not attempt resuscitation (DNR) -DNR-LIMITED -Do Not Intubate/DNI  Family Communication:   Status is: Inpatient Remains inpatient appropriate because: pending SNF    Subjective:  No acute events overnight, Most of her speech is incomprehensible.  Examination:  General exam: Appears calm and comfortable, advanced dementia Respiratory system: Clear to auscultation. Respiratory effort normal. Cardiovascular system: S1 & S2 heard, RRR. No JVD, murmurs, rubs, gallops or clicks. No pedal edema. Gastrointestinal system: Abdomen is nondistended, soft and nontender. No organomegaly or masses felt. Normal bowel sounds heard. Central nervous system: Alert and oriented x 1. No focal neurological deficits. Extremities: Symmetric 5 x 5 power. Skin: No rashes, lesions or ulcers      Diet Orders (From admission, onward)     Start     Ordered   06/11/24 1552  Diet regular Room service appropriate? Yes; Fluid consistency: Thin  Diet effective now       Question Answer Comment  Room service appropriate? Yes   Fluid consistency: Thin      06/11/24 1551            Objective: Vitals:   06/12/24 1345 06/12/24 2006 06/13/24 0346 06/13/24 0751  BP: 93/68 (!) 148/89 130/81 131/73  Pulse: 80 (!) 110 (!) 110 94  Resp: 18  18 16   Temp: 97.7 F (36.5 C) 98.5 F (36.9 C) 97.8 F (36.6 C)   TempSrc: Oral Oral    SpO2: 97% 95% 99% 90%  Weight:      Height:        Intake/Output Summary  (Last 24 hours) at 06/13/2024 1228 Last data filed at 06/12/2024 2343 Gross per 24 hour  Intake 920 ml  Output 700 ml  Net 220 ml   Filed Weights   06/09/24 1407 06/10/24 0900  Weight: 59.9 kg 56.9 kg    Scheduled Meds:  azithromycin   500 mg Oral Daily   Chlorhexidine  Gluconate Cloth  6 each Topical Daily   escitalopram   5 mg Oral Daily   ferrous gluconate  324 mg Oral BID WC   heparin   5,000 Units Subcutaneous Q8H   mupirocin ointment  1 Application Nasal BID   OLANZapine  zydis  5 mg Oral QHS   Continuous Infusions:  cefTRIAXone  (ROCEPHIN )  IV 2 g (06/12/24 2055)    Nutritional status Signs/Symptoms: severe muscle depletion, moderate fat depletion Interventions: Ensure Enlive (each supplement provides 350kcal and 20 grams of protein), MVI Body mass index is 21.53 kg/m.  Data Reviewed:   CBC: Recent Labs  Lab 06/09/24 1401 06/12/24 0241 06/13/24 0611  WBC 11.1* 7.2 8.2  NEUTROABS 6.4  --   --   HGB 12.3 7.4* 7.8*  HCT 37.5 22.4* 23.9*  MCV 96.2 96.6 96.8  PLT 169 96* 125*   Basic Metabolic Panel: Recent Labs  Lab 06/09/24 1358 06/12/24 0241 06/13/24 0611  NA 144 138 139  K 4.5 3.3* 3.5  CL 106 104 104  CO2 27 25 24   GLUCOSE 103* 102* 75  BUN 18 12 11   CREATININE 0.90 0.87 0.75  CALCIUM  9.4 7.9* 8.0*   GFR: Estimated Creatinine Clearance: 39.6 mL/min (by C-G formula based on SCr of 0.75 mg/dL). Liver Function Tests: Recent Labs  Lab 06/09/24 1358  AST 30  ALT 24  ALKPHOS 86  BILITOT 0.3  PROT 7.0  ALBUMIN 4.1   No results for input(s): LIPASE, AMYLASE in the last 168 hours. No results for input(s): AMMONIA in the last 168 hours. Coagulation Profile: Recent Labs  Lab 06/09/24 1401  INR 0.9   Cardiac Enzymes: No results for input(s): CKTOTAL, CKMB, CKMBINDEX, TROPONINI in the last 168 hours. BNP (last 3 results) No results for input(s): PROBNP in the last 8760 hours. HbA1C: No results for input(s): HGBA1C in the last  72 hours. CBG: No results for input(s): GLUCAP in the last 168 hours. Lipid Profile: No results for input(s): CHOL, HDL, LDLCALC, TRIG, CHOLHDL, LDLDIRECT in the last 72 hours. Thyroid  Function Tests: No results for input(s): TSH, T4TOTAL, FREET4, T3FREE, THYROIDAB in the last 72 hours. Anemia Panel: No results for input(s): VITAMINB12, FOLATE, FERRITIN, TIBC, IRON, RETICCTPCT in the last 72 hours. Sepsis Labs: No results for input(s): PROCALCITON, LATICACIDVEN in the last 168 hours.  Recent Results (from the past 240 hours)  Surgical PCR screen     Status: Abnormal   Collection Time: 06/11/24 12:49 AM   Specimen: Nasal Mucosa; Nasal Swab  Result Value Ref Range Status   MRSA, PCR POSITIVE (A) NEGATIVE Final   Staphylococcus aureus POSITIVE (A) NEGATIVE Final    Comment: CRITICAL RESULTS CALLED TO, READ BACK BY AND VERIFIED WITH: RN [email or web address].[email or web address] ON 06/11/24 AT 0221 BY NM (NOTE) The Xpert SA Assay (FDA approved for NASAL specimens in patients 35 years of age and older), is  one component of a comprehensive surveillance program. It is not intended to diagnose infection nor to guide or monitor treatment. Performed at [facility], [address] [place], [place] [address]          Radiology Studies: No results found.         LOS: 4 days   Time spent= 35 mins    Deliliah Room, MD [facility]  If 7PM-7AM, please contact night-coverage  06/13/2024, 12:28 PM

## 2024-06-14 ENCOUNTER — Encounter (HOSPITAL_COMMUNITY): Payer: Self-pay | Admitting: Sports Medicine

## 2024-06-14 DIAGNOSIS — S72002A Fracture of unspecified part of neck of left femur, initial encounter for closed fracture: Secondary | ICD-10-CM | POA: Diagnosis not present

## 2024-06-14 DIAGNOSIS — F0390 Unspecified dementia without behavioral disturbance: Secondary | ICD-10-CM

## 2024-06-14 LAB — CBC WITH DIFFERENTIAL/PLATELET
Abs Immature Granulocytes: 0.04 K/uL (ref 0.00–0.07)
Basophils Absolute: 0 K/uL (ref 0.0–0.1)
Basophils Relative: 0 %
Eosinophils Absolute: 0.5 K/uL (ref 0.0–0.5)
Eosinophils Relative: 5 %
HCT: 23.9 % — ABNORMAL LOW (ref 36.0–46.0)
Hemoglobin: 7.9 g/dL — ABNORMAL LOW (ref 12.0–15.0)
Immature Granulocytes: 1 %
Lymphocytes Relative: 28 %
Lymphs Abs: 2.5 K/uL (ref 0.7–4.0)
MCH: 31.7 pg (ref 26.0–34.0)
MCHC: 33.1 g/dL (ref 30.0–36.0)
MCV: 96 fL (ref 80.0–100.0)
Monocytes Absolute: 0.6 K/uL (ref 0.1–1.0)
Monocytes Relative: 7 %
Neutro Abs: 5.1 K/uL (ref 1.7–7.7)
Neutrophils Relative %: 59 %
Platelets: 141 K/uL — ABNORMAL LOW (ref 150–400)
RBC: 2.49 MIL/uL — ABNORMAL LOW (ref 3.87–5.11)
RDW: 13.2 % (ref 11.5–15.5)
WBC: 8.6 K/uL (ref 4.0–10.5)
nRBC: 0 % (ref 0.0–0.2)

## 2024-06-14 NOTE — Progress Notes (Signed)
 RN entered room to assess patient and noted that IV to LFA had infiltrated. IV antibiotic was almost complete with approximately 30ml in bag. IV immediately stopped. IV removed; catheter intact. Slight edema noted. IV site leaking upon removal. Heat applied to area, arm elevated. Pharmacy notified who had no further recommendations. NP on call notified who stated that IV can be left out for now. Patient resting comfortably at present.

## 2024-06-14 NOTE — NC FL2 (Signed)
 [place]  MEDICAID FL2 LEVEL OF CARE FORM     IDENTIFICATION  Patient Name: Mikayla Wilson Birthdate: Aug 03, 1930 Sex: female Admission Date (Current Location): 06/09/2024  [facility] and [place] Number:  Producer, Television/film/video and Address:  The . [facility], [address], [place], [place] [address]      Provider Number: [id number]  Attending Physician Name and Address:  Patsy Lenis, MD  Relative Name and Phone Number:  Caela, Huot (Daughter)  [phone] [facility])    Current Level of Care: Hospital Recommended Level of Care: Skilled Nursing Facility Prior Approval Number:    Date Approved/Denied:   PASRR Number: [id number]  Discharge Plan: Home    Current Diagnoses: Patient Active Problem List   Diagnosis Date Noted   Protein-calorie malnutrition, severe 06/13/2024   Malnutrition of moderate degree 06/11/2024   Closed left hip fracture (HCC) 06/09/2024   Sepsis due to gram-negative UTI (HCC) 09/02/2023   Sepsis due to undetermined organism (HCC) 09/02/2023   Lobar pneumonia 09/02/2023   Acute metabolic encephalopathy 09/02/2023   Acute cystitis without hematuria 09/02/2023   Hypokalemia 08/18/2016   Smoker 12/03/2015   Hip fracture requiring operative repair (HCC) 11/30/2015   Asthma 09/24/2015   HLD (hyperlipidemia) 08/27/2015   GERD (gastroesophageal reflux disease) 08/27/2015   Anxiety 01/26/2015    Orientation RESPIRATION BLADDER Height & Weight     Self  Normal Incontinent Weight: 125 lb 7.1 oz (56.9 kg) Height:  5' 4 (162.6 cm)  BEHAVIORAL SYMPTOMS/MOOD NEUROLOGICAL BOWEL NUTRITION STATUS      Continent Diet (see d/c summary)  AMBULATORY STATUS COMMUNICATION OF NEEDS Skin   Extensive Assist Verbally Surgical wounds (incision hip)                       Personal Care Assistance Level of Assistance  Bathing, Feeding, Dressing Bathing Assistance: Maximum assistance Feeding assistance: Independent Dressing Assistance:  Maximum assistance     Functional Limitations Info  Hearing, Sight, Speech Sight Info: Impaired Hearing Info: Impaired Speech Info: Adequate    SPECIAL CARE FACTORS FREQUENCY  PT (By licensed PT), OT (By licensed OT) (incision hi)     PT Frequency: 5x/week OT Frequency: 5x/week            Contractures Contractures Info: Not present    Additional Factors Info  Code Status, Allergies Code Status Info: DNR Allergies Info: Morphine And Codeine,           Current Medications (06/14/2024):  This is the current hospital active medication list Current Facility-Administered Medications  Medication Dose Route Frequency Provider Last Rate Last Admin   Chlorhexidine  Gluconate Cloth 2 % PADS 6 each  6 each Topical Daily Germaine Redbird, MD   6 each at 06/13/24 0850   escitalopram  (LEXAPRO ) tablet 5 mg  5 mg Oral Daily Germaine Redbird, MD   5 mg at 06/14/24 0855   feeding supplement (ENSURE PLUS HIGH PROTEIN) liquid 237 mL  237 mL Oral BID BM Rashid, Farhan, MD   237 mL at 06/14/24 0901   ferrous gluconate  (FERGON) tablet 324 mg  324 mg Oral BID WC Paudel, Keshab, MD   324 mg at 06/14/24 0855   heparin  injection 5,000 Units  5,000 Units Subcutaneous Q8H Reynolds, Alan, MD   5,000 Units at 06/14/24 0534   HYDROmorphone  (DILAUDID ) injection 0.5 mg  0.5 mg Intravenous Q4H PRN Donati-Garmon, Natalie M, NP       LORazepam  (ATIVAN ) injection 0.5 mg  0.5 mg Intravenous  Q8H PRN Germaine Redbird, MD   0.5 mg at 06/12/24 1651   multivitamin with minerals tablet 1 tablet  1 tablet Oral Daily Rashid, Farhan, MD   1 tablet at 06/14/24 0855   mupirocin ointment (BACTROBAN) 2 % 1 Application  1 Application Nasal BID Germaine Redbird, MD   1 Application at 06/14/24 9144   OLANZapine  zydis (ZYPREXA ) disintegrating tablet 5 mg  5 mg Oral QHS Germaine Redbird, MD   5 mg at 06/13/24 2102   oxyCODONE  (Oxy IR/ROXICODONE ) immediate release tablet 5 mg  5 mg Oral Q4H PRN Donati-Garmon, Natalie M, NP       polyethylene  glycol (MIRALAX  / GLYCOLAX ) packet 17 g  17 g Oral Daily PRN Germaine Redbird, MD       thiamine (VITAMIN B1) tablet 100 mg  100 mg Oral Daily Rashid, Farhan, MD   100 mg at 06/14/24 9144     Discharge Medications: Please see discharge summary for a list of discharge medications.  Relevant Imaging Results:  Relevant Lab Results:   Additional Information SSN: [id number]  [facility], LCSW

## 2024-06-14 NOTE — Progress Notes (Signed)
 Progress Note    Mikayla Wilson   FMW:[id number]  DOB: 1931/04/14  DOA: 06/09/2024     5 PCP: Pcp, No  Initial CC: fall   Hospital Course: 89 y.o. female with medical history significant for dementia, asthma, coronary artery disease. Patient was brought to the ED reports of an unwitnessed fall.  CT left hip showed comminuted mildly displaced and angulated fracture of the left femur.  Nondisplaced fracture of the left sacral ala of unclear chronicity. She underwent cephalomedullary nail fixation of left intertrochanteric femur fracture on 06/11/2024.   Assessment & Plan:   Closed left hip fracture -Status post unwitnessed fall. -CT: comminuted, mildly displaced and angulated fracture proximal left femur fractures, intertrochanteric and at the base of the femoral neck. - Nondisplaced fracture of left sacral ala-unclear chronicity. - Bone lesions in proximal femur and left sacral ala. - S/P Left intertrochanteric fracture fixation with IM nail 11/29 - PT/OT, SNF recommended - WBAT with walker   Advanced dementia -Nursing home resident -On Lexapro  and Ativan  at nursing home for agitation -Resumed Lexapro  - agree that PEG not recommended with her FTT and poor intake in setting of advanced dementia and advanced age - PCM spoke with daughter; not ready for much GOC talks at this time and family believes patient eating enough for now - would benefit from [facility] following at discharge at [facility] but sounds like family not ready; if patient further deteriorates due to poor intake after discharge, palliative care can be reconsulted at that time   Postop anemia due to acute blood loss - stable Hgb post op    Possible bacterial community acquired pneumonia -No respiratory symptoms noted. -WBC 11.1, tachycardic heart rate possibly from dehydration versus uncontrolled pain -CT head/cervical spine-peripheral right upper lobe consolidation with surrounding nodularity, most likely  infectious/inflammatory - completed rocephin  and azithro x 5 days   Hypokalemia - Replete as needed   Asthma-stable  Interval History:  Pleasantly demented resting in bed in no distress. Had not eaten any lunch.   Antimicrobials:   DVT prophylaxis:  heparin  injection 5,000 Units Start: 06/09/24 2300   Code Status:   Code Status: Limited: Do not attempt resuscitation (DNR) -DNR-LIMITED -Do Not Intubate/DNI   Mobility Assessment (Last 72 Hours)     Mobility Assessment     Row Name 06/14/24 0900 06/13/24 1940 06/13/24 1500 06/13/24 1200 06/13/24 1119   Does the patient have exclusion criteria? No- Perform mobility assessment No- Perform mobility assessment -- No- Perform mobility assessment --   What is the highest level of mobility based on the mobility assessment? Level 1 (Bedfast) - Unable to balance while sitting on edge of bed Level 2 (Chairfast) - Balance while sitting on edge of bed and cannot stand Level 1 (Bedfast) - Unable to balance while sitting on edge of bed Level 1 (Bedfast) - Unable to balance while sitting on edge of bed Level 1 (Bedfast) - Unable to balance while sitting on edge of bed   Is the above level different from baseline mobility prior to current illness? Yes - Recommend PT order Yes - Recommend PT order -- Yes - Recommend PT order --    Row Name 06/12/24 1920 06/12/24 0845 06/11/24 1940       Does the patient have exclusion criteria? No- Perform mobility assessment No- Perform mobility assessment No- Perform mobility assessment     What is the highest level of mobility based on the mobility assessment? Level 2 (Chairfast) - Balance while sitting on  edge of bed and cannot stand Level 2 (Chairfast) - Balance while sitting on edge of bed and cannot stand Level 2 (Chairfast) - Balance while sitting on edge of bed and cannot stand     Is the above level different from baseline mobility prior to current illness? Yes - Recommend PT order Yes - Recommend PT order  Yes - Recommend PT order        Diet: Diet Orders (From admission, onward)     Start     Ordered   06/11/24 1552  Diet regular Room service appropriate? Yes; Fluid consistency: Thin  Diet effective now       Question Answer Comment  Room service appropriate? Yes   Fluid consistency: Thin      06/11/24 1551            Barriers to discharge:  Disposition Plan:  SNF HH orders placed: n/a Status is: Inpt  Objective: Blood pressure 122/69, pulse 97, temperature 98.8 F (37.1 C), temperature source Axillary, resp. rate 16, height 5' 4 (1.626 m), weight 56.9 kg, SpO2 98%.  Examination:  Physical Exam Constitutional:      Comments: Pleasantly demented elderly woman lying in bed in no distress  HENT:     Head: Normocephalic and atraumatic.     Mouth/Throat:     Mouth: Mucous membranes are moist.  Eyes:     Extraocular Movements: Extraocular movements intact.  Cardiovascular:     Rate and Rhythm: Normal rate and regular rhythm.  Pulmonary:     Effort: Pulmonary effort is normal. No respiratory distress.     Breath sounds: Normal breath sounds. No wheezing.  Abdominal:     General: Bowel sounds are normal. There is no distension.     Palpations: Abdomen is soft.     Tenderness: There is no abdominal tenderness.  Musculoskeletal:     Cervical back: Normal range of motion and neck supple.     Comments: Left hip surgical dressing in place but missing distal surgical dressing and mild small incision noted not fully closed.  Compartments soft  Skin:    General: Skin is warm and dry.  Neurological:     Mental Status: Mental status is at baseline.  Psychiatric:        Mood and Affect: Mood normal.      Consultants:  PCM Orthopedic surgery  Procedures:    Data Reviewed: Results for orders placed or performed during the hospital encounter of 06/09/24 (from the past 24 hours)  CBC with Differential/Platelet     Status: Abnormal   Collection Time: 06/14/24  4:21 AM   Result Value Ref Range   WBC 8.6 4.0 - 10.5 K/uL   RBC 2.49 (L) 3.87 - 5.11 MIL/uL   Hemoglobin 7.9 (L) 12.0 - 15.0 g/dL   HCT 76.0 (L) 63.9 - 53.9 %   MCV 96.0 80.0 - 100.0 fL   MCH 31.7 26.0 - 34.0 pg   MCHC 33.1 30.0 - 36.0 g/dL   RDW 86.7 88.4 - 84.4 %   Platelets 141 (L) 150 - 400 K/uL   nRBC 0.0 0.0 - 0.2 %   Neutrophils Relative % 59 %   Neutro Abs 5.1 1.7 - 7.7 K/uL   Lymphocytes Relative 28 %   Lymphs Abs 2.5 0.7 - 4.0 K/uL   Monocytes Relative 7 %   Monocytes Absolute 0.6 0.1 - 1.0 K/uL   Eosinophils Relative 5 %   Eosinophils Absolute 0.5 0.0 - 0.5 K/uL  Basophils Relative 0 %   Basophils Absolute 0.0 0.0 - 0.1 K/uL   Immature Granulocytes 1 %   Abs Immature Granulocytes 0.04 0.00 - 0.07 K/uL    I have reviewed pertinent nursing notes, vitals, labs, and images as necessary. I have ordered labwork to follow up on as indicated.  I have reviewed the last notes from staff over past 24 hours. I have discussed patient's care plan and test results with nursing staff, CM/SW, and other staff as appropriate.  Old records reviewed in assessment of this patient  Time spent: Greater than 50% of the 55 minute visit was spent in counseling/coordination of care for the patient as laid out in the A&P.   LOS: 5 days   Alm Apo, MD [facility] 06/14/2024, 4:48 PM

## 2024-06-14 NOTE — Progress Notes (Signed)
 During morning rounding pt was found to have wet linens beneath her. Staff members x3, this RN included, assisted pt with linen change and peri care. During patient care, patient was yelling, cursing staff, and attempting to hit staff. Staff were able to dodge patients attempts with physical aggression and were able to change linens and provide care appropriately. Patient was unable to follow commands or be redirected. Patient currently lying in bed with eyes closed.

## 2024-06-14 NOTE — Plan of Care (Signed)
  Problem: Education: Goal: Knowledge of General Education information will improve Description: Including pain rating scale, medication(s)/side effects and non-pharmacologic comfort measures Outcome: Not Progressing Note: Hx of dementia; disoriented x 4

## 2024-06-14 NOTE — Plan of Care (Signed)
   Problem: Clinical Measurements: Goal: Ability to maintain clinical measurements within normal limits will improve Outcome: Progressing

## 2024-06-14 NOTE — TOC Initial Note (Signed)
 Transition of Care [facility]) - Initial/Assessment Note    Patient Details  Name: Mikayla Wilson MRN: [id number] Date of Birth: [birth year]  [facility]) CM/SW Contact:    Luann SHAUNNA Cumming, LCSW Phone Number: 06/14/2024, 10:36 AM  Clinical Narrative:   CSW contacted [facility] and confirmed pt is a LTC resident there. Teressa Beagle is requesting medicare auth  be pursued so pt can get rehab on her return to SNF.            CSW contacted pt's daughter to confirm pt is a LTC resident at [facility]. She is agreeable for pt to return on discharge. CSW updated her about auth to receive rehab.   SNF auth request initiated in online portal. Ref# [id number]  Expected Discharge Plan: Skilled Nursing Facility Barriers to Discharge: Continued Medical Work up   Patient Goals and CMS Choice            Expected Discharge Plan and Services       Living arrangements for the past 2 months: Skilled Nursing Facility                                      Prior Living Arrangements/Services Living arrangements for the past 2 months: Skilled Nursing Facility   Patient language and need for interpreter reviewed:: Yes        Need for Family Participation in Patient Care: Yes (Comment) Care giver support system in place?: Yes (comment)   Criminal Activity/Legal Involvement Pertinent to Current Situation/Hospitalization: No - Comment as needed  Activities of Daily Living   ADL Screening (condition at time of admission) Independently performs ADLs?: No Does the patient have a NEW difficulty with bathing/dressing/toileting/self-feeding that is expected to last >3 days?: No Does the patient have a NEW difficulty with getting in/out of bed, walking, or climbing stairs that is expected to last >3 days?: No Does the patient have a NEW difficulty with communication that is expected to last >3 days?: No  Permission Sought/Granted                  Emotional Assessment        Orientation: : Oriented to Self Alcohol / Substance Use: Not Applicable Psych Involvement: No (comment)  Admission diagnosis:  Closed left hip fracture (HCC) [S72.002A] Closed fracture of left hip, initial encounter (HCC) [S72.002A] Patient Active Problem List   Diagnosis Date Noted   Protein-calorie malnutrition, severe 06/13/2024   Malnutrition of moderate degree 06/11/2024   Closed left hip fracture (HCC) 06/09/2024   Sepsis due to gram-negative UTI (HCC) 09/02/2023   Sepsis due to undetermined organism (HCC) 09/02/2023   Lobar pneumonia 09/02/2023   Acute metabolic encephalopathy 09/02/2023   Acute cystitis without hematuria 09/02/2023   Hypokalemia 08/18/2016   Smoker 12/03/2015   Hip fracture requiring operative repair (HCC) 11/30/2015   Asthma 09/24/2015   HLD (hyperlipidemia) 08/27/2015   GERD (gastroesophageal reflux disease) 08/27/2015   Anxiety 01/26/2015   PCP:  Freddrick Johns Pharmacy:   [facility] [address], [place] - [id number] [place] [address] Phone: [phone] Fax: [phone]     Social Drivers of Health (SDOH) Social History: SDOH Screenings   Food Insecurity: Patient Unable To Answer (06/09/2024)  Housing: Unknown (06/09/2024)  Transportation Needs: Patient Unable To Answer (06/09/2024)  Utilities: Patient Unable To Answer (06/09/2024)  Depression (PHQ2-9):  Low Risk  (03/09/2023)  Social Connections: Unknown (06/09/2024)  Tobacco Use: High Risk (06/11/2024)   SDOH Interventions:     Readmission Risk Interventions     No data to display

## 2024-06-15 DIAGNOSIS — S72002A Fracture of unspecified part of neck of left femur, initial encounter for closed fracture: Secondary | ICD-10-CM | POA: Diagnosis not present

## 2024-06-15 LAB — BASIC METABOLIC PANEL WITH GFR
Anion gap: 15 (ref 5–15)
BUN: 8 mg/dL (ref 8–23)
CO2: 27 mmol/L (ref 22–32)
Calcium: 8.5 mg/dL — ABNORMAL LOW (ref 8.9–10.3)
Chloride: 99 mmol/L (ref 98–111)
Creatinine, Ser: 0.77 mg/dL (ref 0.44–1.00)
GFR, Estimated: >60 mL/min (ref >60)
Glucose, Bld: 101 mg/dL — ABNORMAL HIGH (ref 70–99)
Potassium: 3.3 mmol/L — ABNORMAL LOW (ref 3.5–5.1)
Sodium: 141 mmol/L (ref 135–145)

## 2024-06-15 LAB — MAGNESIUM: Magnesium: 2 mg/dL (ref 1.7–2.4)

## 2024-06-15 MED ORDER — LORAZEPAM 1 MG PO TABS: 1.0000 mg | ORAL_TABLET | Freq: Every morning | ORAL | 0 refills | Status: AC

## 2024-06-15 MED ORDER — ASPIRIN 81 MG PO TBEC: 81.0000 mg | DELAYED_RELEASE_TABLET | Freq: Two times a day (BID) | ORAL | Status: DC

## 2024-06-15 MED ORDER — ENOXAPARIN SODIUM 30 MG/0.3ML IJ SOSY: 30.0000 mg | PREFILLED_SYRINGE | Freq: Every day | INTRAMUSCULAR | Status: DC

## 2024-06-15 MED ORDER — ENOXAPARIN SODIUM 30 MG/0.3ML IJ SOSY: 30.0000 mg | PREFILLED_SYRINGE | Freq: Every day | INTRAMUSCULAR | Status: AC

## 2024-06-15 MED ORDER — OXYCODONE HCL 5 MG PO TABS: 5.0000 mg | ORAL_TABLET | ORAL | 0 refills | Status: AC | PRN

## 2024-06-15 MED ORDER — OXYCODONE HCL 5 MG PO TABS: 5.0000 mg | ORAL_TABLET | ORAL | 0 refills | Status: DC | PRN

## 2024-06-15 NOTE — TOC Transition Note (Signed)
 Transition of Care [facility]) - Discharge Note   Patient Details  Name: Mikayla Wilson MRN: [id number] Date of Birth: [birth year]  Transition [facility]) CM/SW Contact:  Bridget Cordella Simmonds, LCSW Phone Number: 06/15/2024, 11:33 AM   Clinical Narrative:  Pt discharging to [facility].  RN call report to [phone].  PTAR called [phone].      Final next level of care: Skilled Nursing Facility Barriers to Discharge: Barriers Resolved   Patient Goals and CMS Choice            Discharge Placement              Patient chooses bed at: [facility] Patient to be transferred to facility by: ptar Name of family member notified: daughter [name] Patient and family notified of of transfer: 06/15/24  Discharge Plan and Services Additional resources added to the After Visit Summary for                                       Social Drivers of Health (SDOH) Interventions SDOH Screenings   Food Insecurity: Patient Unable To Answer (06/09/2024)  Housing: Unknown (06/09/2024)  Transportation Needs: Patient Unable To Answer (06/09/2024)  Utilities: Patient Unable To Answer (06/09/2024)  Depression (PHQ2-9): Low Risk  (03/09/2023)  Social Connections: Unknown (06/09/2024)  Tobacco Use: High Risk (06/11/2024)     Readmission Risk Interventions     No data to display

## 2024-06-15 NOTE — TOC Progression Note (Addendum)
 Transition of Care [facility]) - Progression Note    Patient Details  Name: Mikayla Wilson MRN: [id number] Date of Birth: 1931-07-04  Transition of Care [facility]) CM/SW Contact  Bridget Cordella Simmonds, LCSW Phone Number: 06/15/2024, 9:45 AM  Clinical Narrative:   SNF auth request remains pending in [place].  CSW spoke with [facility], he will check with his admin if pt can return with auth pending.  [id number]: Message from [place]: they can receive pt today with auth pending. MD informed.  Expected Discharge Plan: Skilled Nursing Facility Barriers to Discharge: Continued Medical Work up               Expected Discharge Plan and Services       Living arrangements for the past 2 months: Skilled Nursing Facility                                       Social Drivers of Health (SDOH) Interventions SDOH Screenings   Food Insecurity: Patient Unable To Answer (06/09/2024)  Housing: Unknown (06/09/2024)  Transportation Needs: Patient Unable To Answer (06/09/2024)  Utilities: Patient Unable To Answer (06/09/2024)  Depression (PHQ2-9): Low Risk  (03/09/2023)  Social Connections: Unknown (06/09/2024)  Tobacco Use: High Risk (06/11/2024)    Readmission Risk Interventions     No data to display

## 2024-06-15 NOTE — Discharge Summary (Addendum)
 Physician Discharge Summary   Mikayla Wilson FMW:[id number] DOB: [birth year] DOA: 06/09/2024  PCP: Freddrick, No  Admit date: 06/09/2024 Discharge date: 06/15/2024  Admitted From: SNF Disposition:  SNF Discharging physician: Alm Apo, MD Barriers to discharge: none  Recommendations at discharge: Follow up with orthopedic surgery Consider referral to palliative care if patient has further decline; daughter not amenable with GOC discussions from [facility] in hospital    Discharge Condition: stable CODE STATUS: DNR Diet recommendation:  Diet Orders (From admission, onward)     Start     Ordered   06/15/24 0000  Diet general        06/15/24 1042   06/11/24 1552  Diet regular Room service appropriate? Yes; Fluid consistency: Thin  Diet effective now       Question Answer Comment  Room service appropriate? Yes   Fluid consistency: Thin      06/11/24 1551            Hospital Course: 89 y.o. female with medical history significant for dementia, asthma, coronary artery disease. Patient was brought to the ED reports of an unwitnessed fall.  CT left hip showed comminuted mildly displaced and angulated fracture of the left femur.  Nondisplaced fracture of the left sacral ala of unclear chronicity. She underwent cephalomedullary nail fixation of left intertrochanteric femur fracture on 06/11/2024.   Assessment & Plan:   Closed left hip fracture -Status post unwitnessed fall. -CT: comminuted, mildly displaced and angulated fracture proximal left femur fractures, intertrochanteric and at the base of the femoral neck. - Nondisplaced fracture of left sacral ala-unclear chronicity. - Bone lesions in proximal femur and left sacral ala. - S/P Left intertrochanteric fracture fixation with IM nail 11/29 - PT/OT, SNF recommended - WBAT with walker  - unclear ASA allergy, so will d/c with Lovenox  daily for ongoing DVT PPX course  Advanced dementia -Nursing home resident -On Lexapro  and  Ativan  at nursing home for agitation - agree that PEG not recommended with her FTT and poor intake in setting of advanced dementia and advanced age - PCM spoke with daughter; not ready for much GOC talks at this time and family believes patient eating enough for now - would benefit from [facility] following at discharge at [facility] but sounds like family not ready; if patient further deteriorates due to poor intake after discharge, palliative care can be reconsulted at that time   Postop anemia due to acute blood loss - stable Hgb post op    Possible bacterial community acquired pneumonia -No respiratory symptoms noted. -WBC 11.1, tachycardic heart rate possibly from dehydration versus uncontrolled pain -CT head/cervical spine-peripheral right upper lobe consolidation with surrounding nodularity, most likely infectious/inflammatory - completed rocephin  and azithro x 5 days   Hypokalemia - Repleted   Asthma-stable    Principal Diagnosis: Closed left hip fracture [facility])  Discharge Diagnoses: Active Hospital Problems   Diagnosis Date Noted   Closed left hip fracture (HCC) 06/09/2024    Priority: 1.   Dementia (HCC) 06/14/2024    Priority: 2.   Protein-calorie malnutrition, severe 06/13/2024   Malnutrition of moderate degree 06/11/2024   Asthma 09/24/2015   Anxiety 01/26/2015    Resolved Hospital Problems  No resolved problems to display.     Discharge Instructions     Diet general   Complete by: As directed    Leave dressing on - Keep it clean, dry, and intact until clinic visit   Complete by: As directed       Allergies  as of 06/15/2024       Reactions   Aspirin Other (See Comments)   My doctor told me to never take aspirin   Morphine And Codeine Other (See Comments)   Unknown reaction        Medication List     STOP taking these medications    sucralfate 1 g tablet Commonly known as: CARAFATE       TAKE these medications    acetaminophen  500 MG  tablet Commonly known as: TYLENOL  Take 2 tablets (1,000 mg total) by mouth every 8 (eight) hours. What changed:  how much to take when to take this additional instructions   cyanocobalamin  500 MCG tablet Commonly known as: VITAMIN B12 Take 1 tablet (500 mcg total) by mouth daily.   enoxaparin  30 MG/0.3ML injection Commonly known as: LOVENOX  Inject 0.3 mLs (30 mg total) into the skin daily for 25 days. Start taking on: June 16, 2024   escitalopram  10 MG tablet Commonly known as: LEXAPRO  Take 10 mg by mouth daily.   famotidine  20 MG tablet Commonly known as: PEPCID  Take 1 tablet (20 mg total) by mouth 2 (two) times daily.   ferrous sulfate 325 (65 FE) MG tablet Take 325 mg by mouth daily.   ibuprofen  200 MG tablet Commonly known as: ADVIL  Take 400 mg by mouth every 8 (eight) hours as needed (pain).   loratadine  10 MG tablet Commonly known as: EQ Loratadine  Take 1 tablet (10 mg total) by mouth daily. TAKE 1 TABLET BY MOUTH ONCE DAILY AS NEEDED FOR ALLERGIES   LORazepam  1 MG tablet Commonly known as: ATIVAN  Take 1 tablet (1 mg total) by mouth every morning. What changed:  when to take this Another medication with the same name was removed. Continue taking this medication, and follow the directions you see here.   Nutritional Drink Liqd Take 1 Dose by mouth daily in the afternoon. Magic Cup (90cc free water in 120cc) in the afternoon for to enhance kcals and protein in diet w/lunch tray.   lactose free nutrition Liqd Take 237 mLs by mouth with breakfast, with lunch, and with evening meal.   oxyCODONE  5 MG immediate release tablet Commonly known as: Oxy IR/ROXICODONE  Take 1 tablet (5 mg total) by mouth every 4 (four) hours as needed for moderate pain (pain score 4-6) or severe pain (pain score 7-10).   pantoprazole  40 MG tablet Commonly known as: PROTONIX  Take 40 mg by mouth in the morning and at bedtime.   potassium chloride  10 MEQ tablet Commonly known as:  KLOR-CON  M Take 1 tablet (10 mEq total) by mouth daily.   rosuvastatin  5 MG tablet Commonly known as: CRESTOR  Take 1 tablet (5 mg total) by mouth daily. What changed: when to take this   Systane 0.4-0.3 % Gel ophthalmic gel Generic drug: Polyethyl Glycol-Propyl Glycol Place 1 Application into both eyes in the morning and at bedtime.   traZODone  50 MG tablet Commonly known as: DESYREL  Take 50 mg by mouth at bedtime.   Vitamin D3 50 MCG (2000 UT) Tabs Take 2,000 Units by mouth daily.               Discharge Care Instructions  (From admission, onward)           Start     Ordered   06/15/24 0000  Leave dressing on - Keep it clean, dry, and intact until clinic visit        06/15/24 1042  Contact information for after-discharge care     Destination     [facility] .   Service: Skilled Nursing Contact information: [address] [place]   [address] [phone]                    Allergies  Allergen Reactions   Aspirin Other (See Comments)    My doctor told me to never take aspirin   Morphine And Codeine Other (See Comments)    Unknown reaction    Consultations: Orthopedic surgery Palliative Care  Procedures: 11/29: Procedure(s): Cephalomedullary nail left intertrochanteric femur fracture  Discharge Exam: BP 110/83 (BP Location: Left Arm)   Pulse 98   Temp 98.4 F (36.9 C)   Resp 17   Ht 5' 4 (1.626 m)   Wt 56.9 kg   SpO2 100%   BMI 21.53 kg/m  Physical Exam Constitutional:      Comments: Pleasantly demented elderly woman lying in bed in no distress  HENT:     Head: Normocephalic and atraumatic.     Mouth/Throat:     Mouth: Mucous membranes are moist.  Eyes:     Extraocular Movements: Extraocular movements intact.  Cardiovascular:     Rate and Rhythm: Normal rate and regular rhythm.  Pulmonary:     Effort: Pulmonary effort is normal. No respiratory distress.     Breath sounds: Normal breath  sounds. No wheezing.  Abdominal:     General: Bowel sounds are normal. There is no distension.     Palpations: Abdomen is soft.     Tenderness: There is no abdominal tenderness.  Musculoskeletal:     Cervical back: Normal range of motion and neck supple.     Comments: Left hip surgical dressing in place. Compartments soft  Skin:    General: Skin is warm and dry.  Neurological:     Mental Status: Mental status is at baseline.  Psychiatric:        Mood and Affect: Mood normal.      The results of significant diagnostics from this hospitalization (including imaging, microbiology, ancillary and laboratory) are listed below for reference.   Microbiology: Recent Results (from the past 240 hours)  Surgical PCR screen     Status: Abnormal   Collection Time: 06/11/24 12:49 AM   Specimen: Nasal Mucosa; Nasal Swab  Result Value Ref Range Status   MRSA, PCR POSITIVE (A) NEGATIVE Final   Staphylococcus aureus POSITIVE (A) NEGATIVE Final    Comment: CRITICAL RESULTS CALLED TO, READ BACK BY AND VERIFIED WITH: RN Z.RISS ON 06/11/24 AT 0221 BY NM (NOTE) The Xpert SA Assay (FDA approved for NASAL specimens in patients 19 years of age and older), is one component of a comprehensive surveillance program. It is not intended to diagnose infection nor to guide or monitor treatment. Performed at [facility], [address], [place], [place] [address]      Labs: BNP (last 3 results) Recent Labs    11/25/23 2230  BNP 43.0   Basic Metabolic Panel: Recent Labs  Lab 06/09/24 1358 06/12/24 0241 06/13/24 0611 06/15/24 0537  NA 144 138 139 141  K 4.5 3.3* 3.5 3.3*  CL 106 104 104 99  CO2 27 25 24 27   GLUCOSE 103* 102* 75 101*  BUN 18 12 11 8   CREATININE 0.90 0.87 0.75 0.77  CALCIUM  9.4 7.9* 8.0* 8.5*  MG  --   --   --  2.0   Liver Function Tests: Recent  Labs  Lab 06/09/24 1358  AST 30  ALT 24  ALKPHOS 86  BILITOT 0.3  PROT 7.0  ALBUMIN 4.1   No results for input(s):  LIPASE, AMYLASE in the last 168 hours. No results for input(s): AMMONIA in the last 168 hours. CBC: Recent Labs  Lab 06/09/24 1401 06/12/24 0241 06/13/24 0611 06/14/24 0421  WBC 11.1* 7.2 8.2 8.6  NEUTROABS 6.4  --   --  5.1  HGB 12.3 7.4* 7.8* 7.9*  HCT 37.5 22.4* 23.9* 23.9*  MCV 96.2 96.6 96.8 96.0  PLT 169 96* 125* 141*   Cardiac Enzymes: No results for input(s): CKTOTAL, CKMB, CKMBINDEX, TROPONINI in the last 168 hours. BNP: Invalid input(s): POCBNP CBG: No results for input(s): GLUCAP in the last 168 hours. D-Dimer No results for input(s): DDIMER in the last 72 hours. Hgb A1c No results for input(s): HGBA1C in the last 72 hours. Lipid Profile No results for input(s): CHOL, HDL, LDLCALC, TRIG, CHOLHDL, LDLDIRECT in the last 72 hours. Thyroid  function studies No results for input(s): TSH, T4TOTAL, T3FREE, THYROIDAB in the last 72 hours.  Invalid input(s): FREET3 Anemia work up No results for input(s): VITAMINB12, FOLATE, FERRITIN, TIBC, IRON, RETICCTPCT in the last 72 hours. Urinalysis    Component Value Date/Time   COLORURINE YELLOW 05/07/2024 0212   APPEARANCEUR CLEAR 05/07/2024 0212   APPEARANCEUR Clear 09/19/2020 1017   LABSPEC 1.029 05/07/2024 0212   PHURINE 5.0 05/07/2024 0212   GLUCOSEU NEGATIVE 05/07/2024 0212   HGBUR MODERATE (A) 05/07/2024 0212   BILIRUBINUR NEGATIVE 05/07/2024 0212   BILIRUBINUR Negative 09/19/2020 1017   KETONESUR NEGATIVE 05/07/2024 0212   PROTEINUR NEGATIVE 05/07/2024 0212   UROBILINOGEN 0.2 01/25/2015 2330   NITRITE POSITIVE (A) 05/07/2024 0212   LEUKOCYTESUR NEGATIVE 05/07/2024 0212   Sepsis Labs Recent Labs  Lab 06/09/24 1401 06/12/24 0241 06/13/24 0611 06/14/24 0421  WBC 11.1* 7.2 8.2 8.6   Microbiology Recent Results (from the past 240 hours)  Surgical PCR screen     Status: Abnormal   Collection Time: 06/11/24 12:49 AM   Specimen: Nasal Mucosa; Nasal Swab   Result Value Ref Range Status   MRSA, PCR POSITIVE (A) NEGATIVE Final   Staphylococcus aureus POSITIVE (A) NEGATIVE Final    Comment: CRITICAL RESULTS CALLED TO, READ BACK BY AND VERIFIED WITH: RN Z.RISS ON 06/11/24 AT 0221 BY NM (NOTE) The Xpert SA Assay (FDA approved for NASAL specimens in patients 71 years of age and older), is one component of a comprehensive surveillance program. It is not intended to diagnose infection nor to guide or monitor treatment. Performed at [facility], [address] [place], [place] [address]     Procedures/Studies: DG HIP UNILAT WITH PELVIS 2-3 VIEWS LEFT Result Date: 06/11/2024 CLINICAL DATA:  Fixation of the proximal left femur EXAM: DG HIP (WITH OR WITHOUT PELVIS) 5V LEFT COMPARISON:  Left hip radiograph dated 06/09/2024 FINDINGS: Five fluoroscopic images obtained during left femoral fixation with intramedullary rod placement. 105.7 seconds fluoro time utilized. Radiation dose 17.18 mGy Kerma. Please see performing physicians operative report for full details. IMPRESSION: Fluoroscopic images were obtained for intraoperative guidance of proximal left femur fixation. Electronically Signed   By: Limin  Xu M.D.   On: 06/11/2024 13:41   DG C-Arm 1-60 Min-No Report Result Date: 06/11/2024 Fluoroscopy was utilized by the requesting physician.  No radiographic interpretation.   DG C-Arm 1-60 Min-No Report Result Date: 06/11/2024 Fluoroscopy was utilized by the requesting physician.  No radiographic interpretation.  CT Hip Left Wo Contrast Result Date: 06/09/2024 CLINICAL DATA:  Trauma. Hip fracture suspected on current radiographs. EXAM: CT OF THE LEFT HIP WITHOUT CONTRAST TECHNIQUE: Multidetector CT imaging of the left hip was performed according to the standard protocol. Multiplanar CT image reconstructions were also generated. RADIATION DOSE REDUCTION: This exam was performed according to the departmental dose-optimization program which  includes automated exposure control, adjustment of the mA and/or kV according to patient size and/or use of iterative reconstruction technique. COMPARISON:  Left hip radiographs, 06/09/2024. FINDINGS: Bones/Joint/Cartilage Acute comminuted and mildly displaced fracture of the proximal left femur. Fracture extends across the base of the neck, but also has an inter trochanteric component with fractures crossing the bases of the greater and lesser trochanters. Displacement of the primary fracture components of approximately 1 cm. There is also varus and apex anterior angulation. There is also an irregular fracture of the left sacral ala of unclear chronicity. There is a lucent appearing lesion within the right sacral ala, as well as a similar area in the left sacral ala. Several lucencies are noted along the sacrum. Within the areas of lucency there is relative increased density, which is also apparent at the fracture site of the proximal left femur. Old healed fracture of the inferior left pubic ramus. Hip joint remains normally aligned. Ligaments Suboptimally assessed by CT. Muscles and Tendons No evidence of a muscle or tendon injury. There is edema within the soft tissues most evident posterior to the fractured proximal left femur. Soft tissues Soft tissue edema/hemorrhage most evident along the posterior margin of the fracture proximal left femur. IMPRESSION: 1. Comminuted, mildly displaced and angulated fracture proximal left femur fractures, intertrochanteric and at the base of the femoral neck. 2. Nondisplaced fracture of the left sacral ala of unclear chronicity. 3. Evidence of lucent bone lesions with lesions suggested at the proximal femur fracture and left sacral ala fracture. The proximal left femur fracture could be pathologic. Correlate with patient's history. Electronically Signed   By: Alm Parkins M.D.   On: 06/09/2024 17:13   CT Head Wo Contrast Result Date: 06/09/2024 EXAM: CT HEAD AND CERVICAL  SPINE 06/09/2024 03:37:00 PM TECHNIQUE: CT of the head and cervical spine was performed without the administration of intravenous contrast. Multiplanar reformatted images are provided for review. Automated exposure control, iterative reconstruction, and/or weight based adjustment of the mA/kV was utilized to reduce the radiation dose to as low as reasonably achievable. COMPARISON: CT Head and CT Cervical Spine Feb 29, 2024 CLINICAL HISTORY: Head trauma, minor (Age >= 65y) FINDINGS: CT HEAD BRAIN AND VENTRICLES: No acute intracranial hemorrhage. No mass effect or midline shift. No abnormal extra-axial fluid collection. No evidence of acute infarct. No hydrocephalus. Similar cerebral atrophy and chronic microvascular ischemic change. ORBITS: No acute abnormality. SINUSES AND MASTOIDS: No acute abnormality. SOFT TISSUES AND SKULL: No acute skull fracture. No acute soft tissue abnormality. CT CERVICAL SPINE BONES AND ALIGNMENT: No acute fracture or traumatic malalignment. DEGENERATIVE CHANGES: No significant degenerative changes. SOFT TISSUES: No prevertebral soft tissue swelling. LUNG APICES: Peripheral right upper lobe consolidation with surrounding nodularity. IMPRESSION: 1. No acute intracranial abnormality. 2. No acute fracture or traumatic malalignment of the cervical spine. 3. Peripheral right upper lobe consolidation with surrounding nodularity, most likely infectious/inflammatory. Recommend follow up CT chest in 3 months to further evaluate. Electronically signed by: Gilmore Molt MD 06/09/2024 03:53 PM EST RP Workstation: [id number]   CT Cervical Spine Wo Contrast Result Date: 06/09/2024 EXAM: CT HEAD AND CERVICAL SPINE  06/09/2024 03:37:00 PM TECHNIQUE: CT of the head and cervical spine was performed without the administration of intravenous contrast. Multiplanar reformatted images are provided for review. Automated exposure control, iterative reconstruction, and/or weight based adjustment of the mA/kV  was utilized to reduce the radiation dose to as low as reasonably achievable. COMPARISON: CT Head and CT Cervical Spine Feb 29, 2024 CLINICAL HISTORY: Head trauma, minor (Age >= 65y) FINDINGS: CT HEAD BRAIN AND VENTRICLES: No acute intracranial hemorrhage. No mass effect or midline shift. No abnormal extra-axial fluid collection. No evidence of acute infarct. No hydrocephalus. Similar cerebral atrophy and chronic microvascular ischemic change. ORBITS: No acute abnormality. SINUSES AND MASTOIDS: No acute abnormality. SOFT TISSUES AND SKULL: No acute skull fracture. No acute soft tissue abnormality. CT CERVICAL SPINE BONES AND ALIGNMENT: No acute fracture or traumatic malalignment. DEGENERATIVE CHANGES: No significant degenerative changes. SOFT TISSUES: No prevertebral soft tissue swelling. LUNG APICES: Peripheral right upper lobe consolidation with surrounding nodularity. IMPRESSION: 1. No acute intracranial abnormality. 2. No acute fracture or traumatic malalignment of the cervical spine. 3. Peripheral right upper lobe consolidation with surrounding nodularity, most likely infectious/inflammatory. Recommend follow up CT chest in 3 months to further evaluate. Electronically signed by: Gilmore Molt MD 06/09/2024 03:53 PM EST RP Workstation: [id number]   DG Hip Unilat W or Wo Pelvis 2-3 Views Left Result Date: 06/09/2024 CLINICAL DATA:  Status post fall with shortened and externally rotated left leg EXAM: DG HIP (WITH OR WITHOUT PELVIS) 3V LEFT COMPARISON:  CT abdomen and pelvis dated 05/07/2024, pelvis radiograph dated 09/05/2023 FINDINGS: Surgical changes of proximal right femoral fixation. Old fracture deformity of the left inferior pubic ramus and left hemi sacrum. Irregular lucency through the intertrochanteric left femur. No acute hip dislocation. Degenerative changes of the bilateral hips. IMPRESSION: Irregular lucency through the intertrochanteric left femur, suspicious for acute fracture. Electronically  Signed   By: Limin  Xu M.D.   On: 06/09/2024 14:33   DG Chest Port 1 View Result Date: 06/09/2024 CLINICAL DATA:  [facility] at a living facility. EXAM: PORTABLE CHEST 1 VIEW COMPARISON:  11/25/2023 and older studies. FINDINGS: Cardiac silhouette is normal in size. No mediastinal or hilar masses. Chronic scarring and interstitial thickening in the lungs. No evidence of pneumonia or pulmonary edema. No pleural effusion or pneumothorax. Skeletal structures are demineralized with no convincing acute fracture. IMPRESSION: No acute findings. Electronically Signed   By: Alm Parkins M.D.   On: 06/09/2024 14:32     Time coordinating discharge: Over 30 minutes    Alm Apo, MD  [facility] 06/15/2024, 11:19 AM

## 2024-06-15 NOTE — Plan of Care (Signed)
°  Problem: Clinical Measurements: Goal: Will remain free from infection Outcome: Progressing Goal: Diagnostic test results will improve Outcome: Progressing   Problem: Education: Goal: Knowledge of General Education information will improve Description: Including pain rating scale, medication(s)/side effects and non-pharmacologic comfort measures Outcome: Not Progressing   Problem: Health Behavior/Discharge Planning: Goal: Ability to manage health-related needs will improve Outcome: Not Progressing   Problem: Activity: Goal: Risk for activity intolerance will decrease Outcome: Not Progressing   Problem: Nutrition: Goal: Adequate nutrition will be maintained Outcome: Not Progressing

## 2024-06-15 NOTE — Consult Note (Signed)
 Palliative Care Consult Note                                  Date: 06/15/2024   Patient Name: Mikayla Wilson  DOB: 06/21/1931  MRN: [id number]  Age / Sex: 89 y.o., female  PCP: Pcp, No Referring Physician: Patsy Lenis, MD  Reason for Consultation: Establishing goals of care  Past Medical History:  Diagnosis Date   Allergy    Anxiety    Arthritis    Asthma    Cataract    Constipation    Coronary artery disease    Depression    Fibrocystic breast disease    GERD (gastroesophageal reflux disease)    Heart murmur    Hiatal hernia    Hiatal hernia    Osteoporosis     Subjective:   This NP Camellia Kays reviewed medical records, received report from team, assessed the patient and then meet at the patient's bedside to discuss diagnosis, prognosis, GOC, EOL wishes disposition and options.  Before meeting with the patient/family, I spent time reviewing the chart notes including nursing note from yesterday, orthopedic note from yesterday, PT note from yesterday, OT note from yesterday, internal medicine note from yesterday, nursing notes from today, TOC note from today, progress note from today.. I also reviewed vital signs, nursing flowsheets, medication administrations record, labs, and imaging. Labs reviewed include CBC which shows continued white blood cell count in the setting of pneumonia with bump in white count 5 days ago.  I met with the patient at bedside, no family is present.  After seeing the patient I called his daughter Amalia [place]combe.   We meet to discuss diagnosis prognosis, GOC, EOL wishes, disposition and options. Concept of Palliative Care was introduced as specialized medical care for people and their families living with serious illness.  If focuses on providing relief from the symptoms and stress of a serious illness.  The goal is to improve quality of life for both the patient and the family. Values and goals of care  important to patient and family were attempted to be elicited.  Created space and opportunity for patient  and family to explore thoughts and feelings regarding current medical situation   Natural trajectory and current clinical status were discussed. Questions and concerns addressed. Patient  encouraged to call with questions or concerns.    Patient/Family Understanding of Illness: The patient's daughter understands that she had a hip fracture and likely pneumonia.  She knows she has advanced dementia and has been having falls and poor intake.  She notes she has not been eating much in the past couple weeks, no food since Thursday.  We spent time talk about her clinical details and including the prognosis and trajectory of advanced dementia including continued decline with ongoing difficulties in appetite and swallowing.  Her daughter shares that she has had swallowing problems in the past however she has rebounded from these.  Baseline Status: At baseline the patient lives at [facility] and long-term care.  She has had previous issues with dysphagia and has not had a good appetite for the past several weeks.  She is not having more falls and recently fell resulting in a hip fracture is now in the hospital for this.  Today's Discussion: In addition to discussions described [place] extensive discussion on various topics including dementia and progression of dementia along with poor appetite and concerns for poor  nutrition and continued decline.  We talked about poor appetite is very common in advanced dementia and likely to continue.  We also discussed her likelihood of overall decline moving forward including concerns about more frequent falls now resulting in a fracture.  She notes that she had previously brought up the idea of a feeding tube.  However, she notes that she fed her mother last night and she ate half a chicken breast, 3-4 remains, half of a chocolate chip cookie, at 90 body bar, ice  cream, tea, and 3 spoons of rice with 2 grapes.  I attempted to discuss concerns about poor nutrition and likelihood of continued decline, especially given that hospitalist mentions family is asking about feeding tubes.  However, patient's daughter states that she does not want to talk about this.  She wants to continue treating the treatable and encouraging her to eat.  I shared the importance of starting the conversation about anticipated decline in appetite and swallowing but she again emphasizes that she does not want to talk about it.  I shared the palliative medicine would follow-up in a few days for progress.  I offered that we are available if she decides she wants to talk about prognosis and concerns about nutrition.  I provided her contact information for any questions or concerns while the patient remains admitted. I provided emotional and general support through therapeutic listening, empathy, sharing of stories, and other techniques. I answered all questions and addressed all concerns to the best of my ability.  Goals: DNR-limited, continue current treatment, encourage oral intake, avoid discussions about concerns of malnutrition for now  Review of Systems  Constitutional:        Denies pain in general  Respiratory:  Negative for shortness of breath.   Gastrointestinal:  Negative for abdominal pain, nausea and vomiting.    Objective:   Primary Diagnoses: Present on Admission:  Anxiety  Asthma  Closed left hip fracture (HCC)   Vital Signs:  BP 110/83 (BP Location: Left Arm)   Pulse 98   Temp 98.4 F (36.9 C)   Resp 17   Ht 5' 4 (1.626 m)   Wt 56.9 kg   SpO2 100%   BMI 21.53 kg/m   Physical Exam Vitals and nursing note reviewed.  Constitutional:      General: She is sleeping. She is not in acute distress.    Comments: Appears frail and weak  HENT:     Head: Normocephalic and atraumatic.  Cardiovascular:     Rate and Rhythm: Normal rate.  Pulmonary:      Effort: Pulmonary effort is normal. No respiratory distress.  Abdominal:     General: Abdomen is flat. Bowel sounds are normal. There is no distension.     Palpations: Abdomen is soft.  Skin:    General: Skin is warm and dry.  Neurological:     Mental Status: She is easily aroused.     Palliative Assessment/Data: 40%   Assessment & Plan:   HPI/Patient Profile: 89 y.o. female  with past medical history of dementia, asthma, coronary artery disease who presented to the ED after unwitnessed fall admitted on 06/09/2024 with close left hip fracture, advanced dementia, postop anemia, possible bacterial community-acquired pneumonia, and others.   Palliative medicine was consulted for GOC conversations.  SUMMARY OF RECOMMENDATIONS   DNR-Limited Continue current scope of care Daughter not interested in talking about appetite problems or options related to malnutrition at this time Palliative medicine will follow-up end of the  week if still admitted We are available for conversation should family be ready to engage  Symptom Management:  Per primary team Palliative medicine that is available to assist as needed  Code Status: DNR - Limited (DNR/DNI)  Prognosis:  Unable to determine  Discharge Planning:  To Be Determined   Discussed with: Patient, family, medical team, nursing team    Thank you for allowing us  to participate in the care of INELL MIMBS PMT will continue to support holistically.  Billing based on MDM: High  Problems Addressed: One or more chronic illnesses with severe exacerbation, progression, or side effects of treatment.  Amount and/or Complexity of Data: Category 1:Review of prior external note(s) from each unique source, Review of the result(s) of each unique test, and Assessment requiring an independent historian(s) and Category 3:Discussion of management or test interpretation with external physician/other qualified health care professional/appropriate  source (not separately reported)  Detailed review of medical records (labs, imaging, vital signs), medically appropriate exam, discussed with treatment team, counseling and education to patient, family, & staff, documenting clinical information, medication management, coordination of care  Signed by: Camellia Kays, NP Palliative Medicine Team  Team Phone # [phone] (Nights/Weekends)  06/15/2024, 9:44 AM

## 2024-06-20 MED FILL — Hydromorphone HCl Preservative Free (PF) Inj 2 MG/ML: INTRAMUSCULAR | Qty: 1 | Status: AC

## 2024-07-21 ENCOUNTER — Emergency Department (HOSPITAL_COMMUNITY): Admission: EM | Admit: 2024-07-21 | Discharge: 2024-07-22 | Disposition: A | Discharge status: Skilled Nursing Facility

## 2024-07-21 ENCOUNTER — Emergency Department (HOSPITAL_COMMUNITY)

## 2024-07-21 ENCOUNTER — Other Ambulatory Visit: Payer: Self-pay

## 2024-07-21 DIAGNOSIS — S0101XA Laceration without foreign body of scalp, initial encounter: Secondary | ICD-10-CM | POA: Insufficient documentation

## 2024-07-21 DIAGNOSIS — W1830XA Fall on same level, unspecified, initial encounter: Secondary | ICD-10-CM | POA: Insufficient documentation

## 2024-07-21 DIAGNOSIS — W19XXXA Unspecified fall, initial encounter: Secondary | ICD-10-CM

## 2024-07-21 DIAGNOSIS — F0390 Unspecified dementia without behavioral disturbance: Secondary | ICD-10-CM | POA: Diagnosis not present

## 2024-07-21 MED ORDER — ACETAMINOPHEN 500 MG PO TABS
1000.0000 mg | ORAL_TABLET | Freq: Once | ORAL | Status: DC
  Filled 2024-07-21: qty 2

## 2024-07-21 NOTE — ED Provider Notes (Signed)
 " [facility] AT [facility] Provider Note   CSN: [id number] Arrival date & time: 07/21/24  1902     Patient presents with: Laceration   Mikayla Wilson is a 89 y.o. female.   89 year old female presents for evaluation of head laceration.  She has dementia.  Is a very poor historian.  Per report she had a fall today.  She is not on any blood thinners.  Bleeding is controlled.  Further history limited at this time.   Laceration      Prior to Admission medications  Medication Sig Start Date End Date Taking? Authorizing Provider  acetaminophen  (TYLENOL ) 500 MG tablet Take 2 tablets (1,000 mg total) by mouth every 8 (eight) hours. Patient taking differently: Take 500-1,000 mg by mouth See admin instructions. Give 2 tablets (1000mg ) by mouth three times daily. Also, give 1 tablet (500mg ) every 6 hours as needed for pain. 09/08/23   Evonnie Lenis, MD  Cholecalciferol (VITAMIN D3) 50 MCG (2000 UT) TABS Take 2,000 Units by mouth daily.    [provider]  cyanocobalamin  (VITAMIN B12) 500 MCG tablet Take 1 tablet (500 mcg total) by mouth daily. 09/09/23   Evonnie Lenis, MD  enoxaparin  (LOVENOX ) 30 MG/0.3ML injection Inject 0.3 mLs (30 mg total) into the skin daily for 25 days. 06/16/24 07/11/24  Patsy Lenis, MD  escitalopram  (LEXAPRO ) 10 MG tablet Take 10 mg by mouth daily.    [provider]  famotidine  (PEPCID ) 20 MG tablet Take 1 tablet (20 mg total) by mouth 2 (two) times daily. 06/10/23   Dettinger, Fonda LABOR, MD  ferrous sulfate 325 (65 FE) MG tablet Take 325 mg by mouth daily.    [provider]  ibuprofen  (ADVIL ) 200 MG tablet Take 400 mg by mouth every 8 (eight) hours as needed (pain).    [provider]  lactose free nutrition (BOOST) LIQD Take 237 mLs by mouth with breakfast, with lunch, and with evening meal.    [provider]  loratadine  (EQ LORATADINE ) 10 MG tablet Take 1 tablet (10 mg total) by mouth daily.  TAKE 1 TABLET BY MOUTH ONCE DAILY AS NEEDED FOR ALLERGIES 06/10/23   Dettinger, Fonda LABOR, MD  LORazepam  (ATIVAN ) 1 MG tablet Take 1 tablet (1 mg total) by mouth every morning. 06/15/24   Patsy Lenis, MD  Nutritional Supplements (NUTRITIONAL DRINK) LIQD Take 1 Dose by mouth daily in the afternoon. Magic Cup (90cc free water  in 120cc) in the afternoon for to enhance kcals and protein in diet w/lunch tray.    [provider]  oxyCODONE  (OXY IR/ROXICODONE ) 5 MG immediate release tablet Take 1 tablet (5 mg total) by mouth every 4 (four) hours as needed for moderate pain (pain score 4-6) or severe pain (pain score 7-10). 06/15/24   Patsy Lenis, MD  pantoprazole  (PROTONIX ) 40 MG tablet Take 40 mg by mouth in the morning and at bedtime.    [provider]  Polyethyl Glycol-Propyl Glycol (SYSTANE) 0.4-0.3 % GEL ophthalmic gel Place 1 Application into both eyes in the morning and at bedtime.    [provider]  potassium chloride  (KLOR-CON  M) 10 MEQ tablet Take 1 tablet (10 mEq total) by mouth daily. 06/10/23   Dettinger, Fonda LABOR, MD  rosuvastatin  (CRESTOR ) 5 MG tablet Take 1 tablet (5 mg total) by mouth daily. Patient taking differently: Take 5 mg by mouth every evening. 06/10/23   Dettinger, Fonda LABOR, MD  traZODone  (DESYREL ) 50 MG tablet Take 50 mg  by mouth at bedtime.    [provider]    Allergies: Aspirin  and Morphine and codeine    Review of Systems  Unable to perform ROS: Dementia    Updated Vital Signs BP 118/62 (BP Location: Left Arm)   Pulse 86   Temp 98.1 F (36.7 C) (Oral)   Ht 5' 4 (1.626 m)   Wt 59 kg   SpO2 98%   BMI 22.33 kg/m   Physical Exam Vitals and nursing note reviewed.  Constitutional:      General: She is not in acute distress.    Appearance: She is well-developed.  HENT:     Head: Normocephalic and atraumatic.  Eyes:     Conjunctiva/sclera: Conjunctivae normal.  Cardiovascular:     Rate and Rhythm: Normal rate and  regular rhythm.     Heart sounds: No murmur heard. Pulmonary:     Effort: Pulmonary effort is normal. No respiratory distress.     Breath sounds: Normal breath sounds.  Abdominal:     Palpations: Abdomen is soft.     Tenderness: There is no abdominal tenderness.  Musculoskeletal:        General: No swelling.     Cervical back: Neck supple.  Skin:    General: Skin is warm and dry.     Capillary Refill: Capillary refill takes less than 2 seconds.  Neurological:     General: No focal deficit present.     Mental Status: She is alert.     Comments: Small 2.5 cm laceration on occiput with bleeding controlled  Psychiatric:        Mood and Affect: Mood normal.     (all labs ordered are listed, but only abnormal results are displayed) Labs Reviewed - No data to display  EKG: None  Radiology: CT Head Wo Contrast Result Date: 07/21/2024 EXAM: CT HEAD WITHOUT CONTRAST 07/21/2024 08:41:28 PM TECHNIQUE: CT of the head was performed without the administration of intravenous contrast. Automated exposure control, iterative reconstruction, and/or weight based adjustment of the mA/kV was utilized to reduce the radiation dose to as low as reasonably achievable. COMPARISON: 06/09/2024 CLINICAL HISTORY: Head trauma, moderate-severe. FINDINGS: BRAIN AND VENTRICLES: No acute hemorrhage. No evidence of acute infarct. No hydrocephalus. No extra-axial collection. No mass effect or midline shift. Global cortical atrophy. Subcortical and periventricular small vessel ischemic changes. Intracranial atherosclerosis. Partially empty sella. ORBITS: No acute abnormality. SINUSES: No acute abnormality. SOFT TISSUES AND SKULL: Mild soft tissue swelling overlying the left occipital bone (image 29). No skull fracture. IMPRESSION: 1. No acute intracranial abnormality. 2. Mild soft tissue swelling overlying the left occipital bone. Electronically signed by: Pinkie Pebbles MD 07/21/2024 08:46 PM EST RP Workstation: [id number]      .Laceration Repair  Date/Time: 07/21/2024 11:52 PM  Performed by: Gennaro Duwaine CROME, DO Authorized by: Gennaro Duwaine CROME, DO   Consent:    Consent obtained:  Emergent situation Laceration details:    Location:  Scalp   Scalp location:  Occipital   Length (cm):  2.5   Depth (mm):  2 Pre-procedure details:    Preparation:  Patient was prepped and draped in usual sterile fashion and imaging obtained to evaluate for foreign bodies Treatment:    Area cleansed with:  Chlorhexidine    Amount of cleaning:  Standard Skin repair:    Repair method:  Staples   Number of staples:  2 Approximation:    Approximation:  Close Repair type:    Repair type:  Simple Post-procedure details:  Dressing:  Non-adherent dressing    Medications Ordered in the ED  acetaminophen  (TYLENOL ) tablet 1,000 mg (has no administration in time range)                                    Medical Decision Making Social determinants of health: Patient has dementia, cannot provide history  Patient here for fall with small laceration on her scalp.  CT negative.  Vitals are stable and she appears well, is awake and talkative.  Small laceration on the back of the scalp was closed with staples as above.  Advised follow-up with primary care in 7 to 10 days for staple removal, Tylenol  as needed for pain and otherwise return for new or worsening symptoms.  Will be discharged back to the nursing home.  Problems Addressed: Fall, initial encounter: acute illness or injury Laceration of scalp without foreign body, initial encounter: acute illness or injury  Amount and/or Complexity of Data Reviewed External Data Reviewed: notes.    Details: Prior hospital records review and patient admitted in November  for left hip fracture Radiology: ordered and independent interpretation performed. Decision-making details documented in ED Course.    Details: Ordered and interpreted by me independently of radiology CT head: Shows  no acute abnormality  Risk OTC drugs. Prescription drug management. Diagnosis or treatment significantly limited by social determinants of health.     Final diagnoses:  Fall, initial encounter  Laceration of scalp without foreign body, initial encounter    ED Discharge Orders     None          Gennaro Duwaine CROME, DO 07/21/24 2354  "

## 2024-07-21 NOTE — Discharge Instructions (Addendum)
 Your staples can come out in 7 to 10 days.  You can follow-up with primary care for this.  Take Tylenol  as needed for pain.

## 2024-07-21 NOTE — ED Notes (Signed)
 PC from pt's daughter. Reports pt has long history of dementia. Pt is cooperative, but it is hard to understand what she is trying to communicate. Offered toileting. Pt declined.

## 2024-07-21 NOTE — ED Triage Notes (Signed)
 Pt experienced an unwitnessed fall at [facility] today with laceration to the back of her head. Bleeding is controlled at this time.
